# Patient Record
Sex: Female | Born: 1955 | Race: White | Hispanic: No | Marital: Married | Smoking: Never smoker
Health system: Southern US, Community
[De-identification: ages and names within clinical notes are randomized; demographics above are authoritative.]

## PROBLEM LIST (undated history)

## (undated) DIAGNOSIS — F410 Panic disorder [episodic paroxysmal anxiety] without agoraphobia: Secondary | ICD-10-CM

## (undated) DIAGNOSIS — Q8581 Pten hamartoma tumor syndrome: Secondary | ICD-10-CM

## (undated) DIAGNOSIS — L439 Lichen planus, unspecified: Secondary | ICD-10-CM

## (undated) DIAGNOSIS — K589 Irritable bowel syndrome without diarrhea: Secondary | ICD-10-CM

## (undated) DIAGNOSIS — I10 Essential (primary) hypertension: Secondary | ICD-10-CM

## (undated) DIAGNOSIS — K649 Unspecified hemorrhoids: Secondary | ICD-10-CM

## (undated) DIAGNOSIS — E785 Hyperlipidemia, unspecified: Secondary | ICD-10-CM

## (undated) DIAGNOSIS — I2510 Atherosclerotic heart disease of native coronary artery without angina pectoris: Secondary | ICD-10-CM

## (undated) DIAGNOSIS — K219 Gastro-esophageal reflux disease without esophagitis: Secondary | ICD-10-CM

## (undated) DIAGNOSIS — I5189 Other ill-defined heart diseases: Secondary | ICD-10-CM

## (undated) DIAGNOSIS — G4730 Sleep apnea, unspecified: Secondary | ICD-10-CM

## (undated) DIAGNOSIS — M797 Fibromyalgia: Secondary | ICD-10-CM

## (undated) DIAGNOSIS — K2210 Ulcer of esophagus without bleeding: Secondary | ICD-10-CM

## (undated) DIAGNOSIS — D369 Benign neoplasm, unspecified site: Secondary | ICD-10-CM

## (undated) DIAGNOSIS — R7989 Other specified abnormal findings of blood chemistry: Secondary | ICD-10-CM

## (undated) DIAGNOSIS — K2970 Gastritis, unspecified, without bleeding: Secondary | ICD-10-CM

## (undated) DIAGNOSIS — M810 Age-related osteoporosis without current pathological fracture: Secondary | ICD-10-CM

## (undated) DIAGNOSIS — E538 Deficiency of other specified B group vitamins: Secondary | ICD-10-CM

## (undated) DIAGNOSIS — F419 Anxiety disorder, unspecified: Secondary | ICD-10-CM

## (undated) DIAGNOSIS — M1990 Unspecified osteoarthritis, unspecified site: Secondary | ICD-10-CM

## (undated) DIAGNOSIS — Z87442 Personal history of urinary calculi: Secondary | ICD-10-CM

## (undated) DIAGNOSIS — C44611 Basal cell carcinoma of skin of unspecified upper limb, including shoulder: Secondary | ICD-10-CM

## (undated) DIAGNOSIS — G4733 Obstructive sleep apnea (adult) (pediatric): Secondary | ICD-10-CM

## (undated) DIAGNOSIS — Z7902 Long term (current) use of antithrombotics/antiplatelets: Secondary | ICD-10-CM

## (undated) HISTORY — PX: ESOPHAGOGASTRODUODENOSCOPY: SHX1529

## (undated) HISTORY — DX: Other ill-defined heart diseases: I51.89

## (undated) HISTORY — PX: COLONOSCOPY: SHX174

## (undated) HISTORY — PX: CHOLECYSTECTOMY: SHX55

## (undated) HISTORY — PX: SKIN CANCER EXCISION: SHX779

## (undated) HISTORY — DX: Age-related osteoporosis without current pathological fracture: M81.0

## (undated) HISTORY — PX: JOINT REPLACEMENT: SHX530

## (undated) HISTORY — PX: TUBAL LIGATION: SHX77

## (undated) HISTORY — DX: Gastro-esophageal reflux disease without esophagitis: K21.9

## (undated) HISTORY — DX: Unspecified osteoarthritis, unspecified site: M19.90

## (undated) HISTORY — PX: ABDOMINAL HYSTERECTOMY: SHX81

## (undated) HISTORY — PX: DILATION AND CURETTAGE OF UTERUS: SHX78

## (undated) HISTORY — DX: Morbid (severe) obesity due to excess calories: E66.01

## (undated) HISTORY — DX: Essential (primary) hypertension: I10

## (undated) HISTORY — DX: Hyperlipidemia, unspecified: E78.5

---

## 2004-07-15 ENCOUNTER — Ambulatory Visit: Payer: Self-pay | Admitting: Internal Medicine

## 2005-03-30 HISTORY — PX: CORONARY ANGIOPLASTY WITH STENT PLACEMENT: SHX49

## 2005-09-17 ENCOUNTER — Ambulatory Visit: Payer: Self-pay | Admitting: Internal Medicine

## 2005-12-14 ENCOUNTER — Other Ambulatory Visit: Payer: Self-pay

## 2005-12-14 ENCOUNTER — Emergency Department: Payer: Self-pay | Admitting: Emergency Medicine

## 2005-12-15 ENCOUNTER — Ambulatory Visit: Payer: Self-pay | Admitting: Emergency Medicine

## 2006-01-20 ENCOUNTER — Inpatient Hospital Stay: Payer: Self-pay | Admitting: Internal Medicine

## 2006-01-20 ENCOUNTER — Other Ambulatory Visit: Payer: Self-pay

## 2006-01-21 ENCOUNTER — Other Ambulatory Visit: Payer: Self-pay

## 2006-01-21 HISTORY — PX: CORONARY ANGIOPLASTY WITH STENT PLACEMENT: SHX49

## 2006-01-23 ENCOUNTER — Other Ambulatory Visit: Payer: Self-pay

## 2006-01-24 ENCOUNTER — Observation Stay: Payer: Self-pay | Admitting: Internal Medicine

## 2006-06-17 ENCOUNTER — Observation Stay: Payer: Self-pay | Admitting: Internal Medicine

## 2006-06-17 ENCOUNTER — Other Ambulatory Visit: Payer: Self-pay

## 2007-02-15 ENCOUNTER — Ambulatory Visit: Payer: Self-pay | Admitting: Internal Medicine

## 2007-10-08 ENCOUNTER — Other Ambulatory Visit: Payer: Self-pay

## 2007-10-08 ENCOUNTER — Emergency Department: Payer: Self-pay | Admitting: Emergency Medicine

## 2007-10-09 ENCOUNTER — Other Ambulatory Visit: Payer: Self-pay

## 2007-10-09 ENCOUNTER — Emergency Department: Payer: Self-pay | Admitting: Emergency Medicine

## 2008-02-16 ENCOUNTER — Ambulatory Visit: Payer: Self-pay | Admitting: Internal Medicine

## 2009-02-28 ENCOUNTER — Ambulatory Visit: Payer: Self-pay | Admitting: Internal Medicine

## 2009-03-12 ENCOUNTER — Observation Stay: Payer: Self-pay | Admitting: Internal Medicine

## 2010-03-04 ENCOUNTER — Ambulatory Visit: Payer: Self-pay | Admitting: Internal Medicine

## 2011-05-06 ENCOUNTER — Ambulatory Visit: Payer: Self-pay | Admitting: Internal Medicine

## 2012-05-09 ENCOUNTER — Ambulatory Visit: Payer: Self-pay | Admitting: Internal Medicine

## 2013-07-12 ENCOUNTER — Ambulatory Visit: Payer: Self-pay | Admitting: Internal Medicine

## 2013-11-29 DIAGNOSIS — M7540 Impingement syndrome of unspecified shoulder: Secondary | ICD-10-CM | POA: Insufficient documentation

## 2013-12-20 DIAGNOSIS — I2510 Atherosclerotic heart disease of native coronary artery without angina pectoris: Secondary | ICD-10-CM | POA: Insufficient documentation

## 2013-12-20 DIAGNOSIS — M810 Age-related osteoporosis without current pathological fracture: Secondary | ICD-10-CM | POA: Insufficient documentation

## 2013-12-20 DIAGNOSIS — E559 Vitamin D deficiency, unspecified: Secondary | ICD-10-CM | POA: Insufficient documentation

## 2013-12-20 DIAGNOSIS — E785 Hyperlipidemia, unspecified: Secondary | ICD-10-CM | POA: Insufficient documentation

## 2014-07-19 ENCOUNTER — Ambulatory Visit
Admit: 2014-07-19 | Disposition: A | Discharge status: Home / Self Care | Payer: Self-pay | Attending: Internal Medicine | Admitting: Internal Medicine

## 2014-11-14 ENCOUNTER — Ambulatory Visit (INDEPENDENT_AMBULATORY_CARE_PROVIDER_SITE_OTHER): Payer: BLUE CROSS/BLUE SHIELD

## 2014-11-14 ENCOUNTER — Encounter: Payer: Self-pay | Admitting: Podiatry

## 2014-11-14 ENCOUNTER — Ambulatory Visit (INDEPENDENT_AMBULATORY_CARE_PROVIDER_SITE_OTHER): Payer: BLUE CROSS/BLUE SHIELD | Admitting: Podiatry

## 2014-11-14 VITALS — BP 143/84 | HR 65 | Resp 18

## 2014-11-14 DIAGNOSIS — M2010 Hallux valgus (acquired), unspecified foot: Secondary | ICD-10-CM

## 2014-11-14 DIAGNOSIS — Q6650 Congenital pes planus, unspecified foot: Secondary | ICD-10-CM | POA: Diagnosis not present

## 2014-11-14 DIAGNOSIS — M76829 Posterior tibial tendinitis, unspecified leg: Secondary | ICD-10-CM

## 2014-11-14 DIAGNOSIS — M6789 Other specified disorders of synovium and tendon, multiple sites: Secondary | ICD-10-CM

## 2014-11-14 DIAGNOSIS — R52 Pain, unspecified: Secondary | ICD-10-CM

## 2014-11-14 NOTE — Progress Notes (Signed)
   Subjective:    Patient ID: Judy Prince, female    DOB: 12/27/1955, 59 y.o.   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 And states that she walks on the insides of her ankles as well and she states that she does not want to be like this.    Review of Systems  HENT: Positive for sinus pressure.   Respiratory: Positive for cough.   Musculoskeletal:       MUSCLE PAIN  Hematological: Bruises/bleeds easily.  All other systems reviewed and are negative.      Objective:   Physical Exam: 59 year old white female in no apparent distress vital signs are stable alert and oriented 3 presents strong palpable pulses bilateral. Capillary fill time to digits 1 through 5 is immediate. Neurologic since is intact percent lasting monofilament. Deep tendon reflexes are intact bilateral and muscle strength is 5 over 5 dorsiflexion plantar flexors and inverters everters all intrinsic musculature is intact. Orthopedic evaluation does demonstrate severe pes planus bilateral flexible in nature. Mild hallux abductovalgus deformity is also noted. Radiographic evaluation AP medial oblique and lateral views do demonstrate severe osteopenia today also demonstrates severe flatfoot deformity with mild to moderate hallux abductovalgus deformities. No major osseous abnormalities such as fracture or fusions or coalitions. No tumors noted. Cutaneous evaluation demonstrates a well hydrated cutis no erythema edema cellulitis drainage or odor.        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 Hallux abductovalgus deformity bilateral.  Plan: Discussed etiology pathology conservative versus surgical therapies. I did suggest the necessity of  orthotics she understands this and is amenable to it reluctantly. She was scanned today for set of orthotics.  Dr. Roselind Messier

## 2014-12-12 ENCOUNTER — Ambulatory Visit: Payer: BLUE CROSS/BLUE SHIELD | Admitting: Podiatry

## 2014-12-26 ENCOUNTER — Encounter: Payer: Self-pay | Admitting: Podiatry

## 2014-12-26 ENCOUNTER — Ambulatory Visit (INDEPENDENT_AMBULATORY_CARE_PROVIDER_SITE_OTHER): Payer: BLUE CROSS/BLUE SHIELD | Admitting: Podiatry

## 2014-12-26 DIAGNOSIS — Q6650 Congenital pes planus, unspecified foot: Secondary | ICD-10-CM

## 2014-12-26 DIAGNOSIS — M6789 Other specified disorders of synovium and tendon, multiple sites: Secondary | ICD-10-CM

## 2014-12-26 DIAGNOSIS — M76829 Posterior tibial tendinitis, unspecified leg: Secondary | ICD-10-CM

## 2014-12-26 NOTE — Patient Instructions (Signed)

## 2014-12-26 NOTE — Progress Notes (Signed)
She presents today to pick up orthotics both oral and written home-going instructions were provided and I will follow-up with her in 1 month she will call sooner if needed.

## 2015-01-28 ENCOUNTER — Encounter: Payer: Self-pay | Admitting: Podiatry

## 2015-01-28 ENCOUNTER — Ambulatory Visit (INDEPENDENT_AMBULATORY_CARE_PROVIDER_SITE_OTHER): Payer: BLUE CROSS/BLUE SHIELD | Admitting: Podiatry

## 2015-01-28 DIAGNOSIS — G5761 Lesion of plantar nerve, right lower limb: Secondary | ICD-10-CM | POA: Diagnosis not present

## 2015-01-28 DIAGNOSIS — M76829 Posterior tibial tendinitis, unspecified leg: Secondary | ICD-10-CM

## 2015-01-28 DIAGNOSIS — Q6650 Congenital pes planus, unspecified foot: Secondary | ICD-10-CM | POA: Diagnosis not present

## 2015-01-28 DIAGNOSIS — M6789 Other specified disorders of synovium and tendon, multiple sites: Secondary | ICD-10-CM | POA: Diagnosis not present

## 2015-01-28 NOTE — Progress Notes (Signed)
She presents today for follow-up of her plantar fasciitis and the use of her orthotics. She states that she was doing fine until she wore a particular pair shoes with her orthotics. She states that it hurts so bad Saturday she had to call the doctor on call.  Objective: Vital signs are stable she is alert and oriented 3. She has pain on palpation to the third interdigital space of the right foot. This is consistent with a neuroma.  Assessment: Neuroma third interdigital space right foot.  Plan: Injected the area today with Kenalog and local anesthetic. Recommended that she get back into her orthotics and discussed appropriate shoe gear. Follow up with her for her fasciitis and neuroma in 1 month.  Roselind Messier DPM

## 2015-02-12 ENCOUNTER — Telehealth: Payer: Self-pay | Admitting: *Deleted

## 2015-02-12 NOTE — Telephone Encounter (Signed)
Ice and Tylenol per Dr Milinda Pointer and keep follow up appt.

## 2015-02-12 NOTE — Telephone Encounter (Addendum)
Pt states the doctor gave her an injection last visit, now feels like it has worsened, she can't take ANSAIDS, because she's on Plavix and they also upset her stomach. Pt wanted an appt and was transferred to schedulers.  Informed pt of Dr. Stephenie Acres orders.

## 2015-02-27 ENCOUNTER — Ambulatory Visit (INDEPENDENT_AMBULATORY_CARE_PROVIDER_SITE_OTHER): Payer: BLUE CROSS/BLUE SHIELD | Admitting: Podiatry

## 2015-02-27 ENCOUNTER — Encounter: Payer: Self-pay | Admitting: Podiatry

## 2015-02-27 DIAGNOSIS — G5791 Unspecified mononeuropathy of right lower limb: Secondary | ICD-10-CM | POA: Diagnosis not present

## 2015-02-27 DIAGNOSIS — G5761 Lesion of plantar nerve, right lower limb: Secondary | ICD-10-CM

## 2015-02-27 NOTE — Progress Notes (Signed)
She presents today for follow-up of a neuroma third interdigital space of the right foot and now has pain to the dorsal medial aspect of the right foot that radiates of the ankle. She states that she gets numbness and tingling along the medial cutaneous nerve as she points to the dorsal aspect of the right foot.  Objective: Vital signs are stable she is alert and oriented 3. She no longer has pain on palpation to the third interdigital space of the right foot pulses remain palpable. She has tenderness on palpation along the length of the medial cutaneous nerve right foot.  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 Resolution of neuroma with use of orthotics and injection.  Plan: I I injected dexamethasone to the point of maximal tenderness today to be alleviated her symptoms. Upon leaving the office she was pain-free. Follow up with her in a few months.

## 2015-04-03 ENCOUNTER — Ambulatory Visit: Payer: BLUE CROSS/BLUE SHIELD | Admitting: Podiatry

## 2015-04-05 ENCOUNTER — Ambulatory Visit (INDEPENDENT_AMBULATORY_CARE_PROVIDER_SITE_OTHER): Payer: BLUE CROSS/BLUE SHIELD | Admitting: Sports Medicine

## 2015-04-05 ENCOUNTER — Encounter: Payer: Self-pay | Admitting: Sports Medicine

## 2015-04-05 ENCOUNTER — Ambulatory Visit: Payer: Self-pay

## 2015-04-05 DIAGNOSIS — S92301A Fracture of unspecified metatarsal bone(s), right foot, initial encounter for closed fracture: Secondary | ICD-10-CM | POA: Diagnosis not present

## 2015-04-05 DIAGNOSIS — S82891A Other fracture of right lower leg, initial encounter for closed fracture: Secondary | ICD-10-CM

## 2015-04-05 DIAGNOSIS — M79671 Pain in right foot: Secondary | ICD-10-CM

## 2015-04-05 NOTE — Progress Notes (Signed)
Patient ID: [name], female   DOB: Apr 05, 1955, 60 y.o.   MRN: [id number] Subjective: Judy Prince is a 61 y.o. female patient who presents to office for evaluation of Right ankle and foot pain. Patient complains of progressive pain since yesterday when she attempts to move; states she went to urgent care after she tripped and fell over a flagstone. She states that she was put in a boot, told not to put any weight on it, and to take her pain med. Patient denies any other pedal complaints.   Osteoporosis on Fosamax  There are no active problems to display for this patient.  Current Outpatient Prescriptions on File Prior to Visit  Medication Sig Dispense Refill  . alendronate (FOSAMAX) 70 MG tablet Take by mouth.    Marland Kitchen aspirin EC 81 MG tablet Take by mouth.    . calcium acetate (PHOSLO) 667 MG tablet Take by mouth.    . cetirizine (ZYRTEC) 10 MG tablet Take by mouth.    . clopidogrel (PLAVIX) 75 MG tablet Take by mouth.    . fluticasone (FLONASE) 50 MCG/ACT nasal spray USE 2 SPRAYS IN EACH NOSTRIL EVERY DAY    . hydrochlorothiazide (HYDRODIURIL) 25 MG tablet Take by mouth.    Marland Kitchen HYDROcodone-acetaminophen (NORCO/VICODIN) 5-325 MG per tablet Take by mouth.    . metoprolol (LOPRESSOR) 50 MG tablet Take by mouth.    . pantoprazole (PROTONIX) 40 MG tablet TAKE 1 TABLET BY MOUTH ONCE A DAY    . rosuvastatin (CRESTOR) 20 MG tablet Take by mouth.    . temazepam (RESTORIL) 15 MG capsule Take by mouth.     No current facility-administered medications on file prior to visit.   No Known Allergies   Objective:  General: Alert and oriented x3 in no acute distress  Dermatology: No open lesions bilateral lower extremities, no webspace macerations, mild ecchymosis right lateral ankle and foot, no fracture blisters, all nails x 10 are well manicured.  Vascular: Focal edema noted to right foot and ankle. Dorsalis Pedis and Posterior Tibial pedal pulses 2/4, Capillary Fill Time 3 seconds,(+) pedal hair  growth bilateral, Temperature gradient within normal limits.  Neurology: Johney Maine sensation intact via light touch bilateral. .   Musculoskeletal: There is tenderness with palpation at lateral ankle, fibular and 5th met on Right foot, No pain iwht tib-fib stress. There is pain present with tuning fork to Right ankle/ foot,No pain with calf compression bilateral. All joint range of motion is within normal limits except on right where there is guarding and pain, Strength within acceptable for patient status.    Xrays 04-04-15 From Urgent Care  Plain film right ankle / foot:  Fracture at the base of the fifth metatarsal minimally displaced. Fracture at the base of the lateral malleolus minimally displaced.    Assessment and Plan: Problem List Items Addressed This Visit    None    Visit Diagnoses    Right foot pain    -  Primary    Closed right ankle fracture, initial encounter        lateral malleous    Closed fracture of metatarsal bone, right, initial encounter        5th met      -Complete examination performed -Xrays report reviewed and discussed with patient -Discussed treatement options for fracture; risks, alternatives, and benefits explained. -Applied surgitube stockinette to assist with edema control -Cont with CAM walker to patient to wear at all times and instructed on use; NWB on  right with use of crutches or wheelchair. -Recommend protection, rest, ice, elevation daily until symptoms improve -Cont with Norco as needed for pain  -Patient to return to office in 2 weeks for serial x-rays to assess healing  or sooner if condition worsens.  -Patient also left a copy of paper from [facility] for Dr. Milinda Pointer to review in re: Injection for Neuritis; Informed patient that we will give info to Dr. Milinda Pointer for review and have our billing department handle the issue.   Landis Martins, DPM

## 2015-04-05 NOTE — Patient Instructions (Signed)
Ankle Fracture A fracture is a break in a bone. The ankle joint is made up of three bones. These include the lower (distal)sections of your lower leg bones, called the tibia and fibula, along with a bone in your foot, called the talus. Depending on how bad the break is and if more than one ankle joint bone is broken, a cast or splint is used to protect and keep your injured bone from moving while it heals. Sometimes, surgery is required to help the fracture heal properly.  There are two general types of fractures:  Stable fracture. This includes a single fracture line through one bone, with no injury to ankle ligaments. A fracture of the talus that does not have any displacement (movement of the bone on either side of the fracture line) is also stable.  Unstable fracture. This includes more than one fracture line through one or more bones in the ankle joint. It also includes fractures that have displacement of the bone on either side of the fracture line. CAUSES  A direct blow to the ankle.   Quickly and severely twisting your ankle.  Trauma, such as a car accident or falling from a significant height. RISK FACTORS You may be at a higher risk of ankle fracture if:  You have certain medical conditions.  You are involved in high-impact sports.  You are involved in a high-impact car accident. SIGNS AND SYMPTOMS   Tender and swollen ankle.  Bruising around the injured ankle.  Pain on movement of the ankle.  Difficulty walking or putting weight on the ankle.  A cold foot below the site of the ankle injury. This can occur if the blood vessels passing through your injured ankle were also damaged.  Numbness in the foot below the site of the ankle injury. DIAGNOSIS  An ankle fracture is usually diagnosed with a physical exam and X-rays. A CT scan may also be required for complex fractures. TREATMENT  Stable fractures are treated with a cast or splint and using crutches to avoid putting  weight on your injured ankle. This is followed by an ankle strengthening program. Some patients require a special type of cast, depending on other medical problems they may have. Unstable fractures require surgery to ensure the bones heal properly. Your health care provider will tell you what type of fracture you have and the best treatment for your condition. HOME CARE INSTRUCTIONS   Review correct crutch use with your health care provider and use your crutches as directed. Safe use of crutches is extremely important. Misuse of crutches can cause you to fall or cause injury to nerves in your hands or armpits.  Do not put weight or pressure on the injured ankle until directed by your health care provider.  To lessen the swelling, keep the injured leg elevated while sitting or lying down.  Apply ice to the injured area:  Put ice in a plastic bag.  Place a towel between your cast and the bag.  Leave the ice on for 20 minutes, 2-3 times a day.  If you have a plaster or fiberglass cast:  Do not try to scratch the skin under the cast with any objects. This can increase your risk of skin infection.  Check the skin around the cast every day. You may put lotion on any red or sore areas.  Keep your cast dry and clean.  If you have a plaster splint:  Wear the splint as directed.  You may loosen the elastic   around the splint if your toes become numb, tingle, or turn cold or blue.  Do not put pressure on any part of your cast or splint; it may break. Rest your cast only on a pillow the first 24 hours until it is fully hardened.  Your cast or splint can be protected during bathing with a plastic bag sealed to your skin with medical tape. Do not lower the cast or splint into water.  Take medicines as directed by your health care provider. Only take over-the-counter or prescription medicines for pain, discomfort, or fever as directed by your health care provider.  Do not drive a vehicle until  your health care provider specifically tells you it is safe to do so.  If your health care provider has given you a follow-up appointment, it is very important to keep that appointment. Not keeping the appointment could result in a chronic or permanent injury, pain, and disability. If you have any problem keeping the appointment, call the facility for assistance. SEEK MEDICAL CARE IF: You develop increased swelling or discomfort. SEEK IMMEDIATE MEDICAL CARE IF:   Your cast gets damaged or breaks.  You have continued severe pain.  You develop new pain or swelling after the cast was put on.  Your skin or toenails below the injury turn blue or gray.  Your skin or toenails below the injury feel cold, numb, or have loss of sensitivity to touch.  There is a bad smell or pus draining from under the cast. MAKE SURE YOU:   Understand these instructions.  Will watch your condition.  Will get help right away if you are not doing well or get worse.   This information is not intended to replace advice given to you by your health care provider. Make sure you discuss any questions you have with your health care provider.  Metatarsal Fracture A metatarsal fracture is a break in a metatarsal bone. Metatarsal bones connect your toe bones to your ankle bones. CAUSES This type of fracture may be caused by:  A sudden twisting of your foot.  A fall onto your foot.  Overuse or repetitive exercise. RISK FACTORS This condition is more likely to develop in people who:  Play contact sports.  Have a bone disease.  Have a low calcium level. SYMPTOMS Symptoms of this condition include:  Pain that is worse when walking or standing.  Pain when pressing on the foot or moving the toes.  Swelling.  Bruising on the top or bottom of the foot.  A foot that appears shorter than the other one. DIAGNOSIS This condition is diagnosed with a physical exam. You may also have imaging tests, such  as:  X-rays.  A CT scan.  MRI. TREATMENT Treatment for this condition depends on its severity and whether a bone has moved out of place. Treatment may involve:  Rest.  Wearing foot support such as a cast, splint, or boot for several weeks.  Using crutches.  Surgery to move bones back into the right position. Surgery is usually needed if there are many pieces of broken bone or bones that are very out of place (displaced fracture).  Physical therapy. This may be needed to help you regain full movement and strength in your foot. You will need to return to your health care provider to have X-rays taken until your bones heal. Your health care provider will look at the X-rays to make sure that your foot is healing well. HOME CARE INSTRUCTIONS  If You  Have a Cast:  Do not stick anything inside the cast to scratch your skin. Doing that increases your risk of infection.  Check the skin around the cast every day. Report any concerns to your health care provider. You may put lotion on dry skin around the edges of the cast. Do not apply lotion to the skin underneath the cast.  Keep the cast clean and dry. If You Have a Splint or a Supportive Boot:  Wear it as directed by your health care provider. Remove it only as directed by your health care provider.  Loosen it if your toes become numb and tingle, or if they turn cold and blue.  Keep it clean and dry. Bathing  Do not take baths, swim, or use a hot tub until your health care provider approves. Ask your health care provider if you can take showers. You may only be allowed to take sponge baths for bathing.  If your health care provider approves bathing and showering, cover the cast or splint with a watertight plastic bag to protect it from water. Do not let the cast or splint get wet. Managing Pain, Stiffness, and Swelling  If directed, apply ice to the injured area (if you have a splint, not a cast).  Put ice in a plastic  bag.  Place a towel between your skin and the bag.  Leave the ice on for 20 minutes, 2-3 times per day.  Move your toes often to avoid stiffness and to lessen swelling.  Raise (elevate) the injured area above the level of your heart while you are sitting or lying down. Driving  Do not drive or operate heavy machinery while taking pain medicine.  Do not drive while wearing foot support on a foot that you use for driving. Activity  Return to your normal activities as directed by your health care provider. Ask your health care provider what activities are safe for you.  Perform exercises as directed by your health care provider or physical therapist. Safety  Do not use the injured foot to support your body weight until your health care provider says that you can. Use crutches as directed by your health care provider. General Instructions  Do not put pressure on any part of the cast or splint until it is fully hardened. This may take several hours.  Do not use any tobacco products, including cigarettes, chewing tobacco, or e-cigarettes. Tobacco can delay bone healing. If you need help quitting, ask your health care provider.  Take medicines only as directed by your health care provider.  Keep all follow-up visits as directed by your health care provider. This is important. SEEK MEDICAL CARE IF:  You have a fever.  Your cast, splint, or boot is too loose or too tight.  Your cast, splint, or boot is damaged.  Your pain medicine is not helping.  You have pain, tingling, or numbness in your foot that is not going away. SEEK IMMEDIATE MEDICAL CARE IF:  You have severe pain.  You have tingling or numbness in your foot that is getting worse.  Your foot feels cold or becomes numb.  Your foot changes color.   This information is not intended to replace advice given to you by your health care provider. Make sure you discuss any questions you have with your health care  provider.   Document Released: 12/06/2001 Document Revised: 07/31/2014 Document Reviewed: 01/10/2014 Elsevier Interactive Patient Education 2016 [place] Released: 03/13/2000 Document Revised: 03/21/2013  Document Reviewed: 10/13/2012 Elsevier Interactive Patient Education [employer].

## 2015-04-08 ENCOUNTER — Telehealth: Payer: Self-pay | Admitting: *Deleted

## 2015-04-08 NOTE — Telephone Encounter (Addendum)
Pt states she needs a note for FMLA, stating she will be out of work with a fractured ankle.  04/10/2015 - pt states she has a itch red rash up her ankle from the elastic compression ankle given to her on Friday for the fractured ankle.  I asked the pt if she felt the elastic anklet is the possible cause of the rash and pt states she hasn't had it off since it was put on 04/05/2015.  I told pt to take the elastic anklet off and cleanse the skin gently with cool water and wash the anklet and put it back on in the morning and only wear it during the day not at night and to wash it every night.  Pt states she is sleeping in the boot, because she is afraid if she turns she may injure the ankle.  I told pt that was okay, but not the anklet.

## 2015-04-09 ENCOUNTER — Encounter: Payer: Self-pay | Admitting: Sports Medicine

## 2015-04-19 ENCOUNTER — Ambulatory Visit (INDEPENDENT_AMBULATORY_CARE_PROVIDER_SITE_OTHER): Payer: BLUE CROSS/BLUE SHIELD | Admitting: Sports Medicine

## 2015-04-19 ENCOUNTER — Ambulatory Visit: Payer: Self-pay

## 2015-04-19 ENCOUNTER — Encounter: Payer: Self-pay | Admitting: Sports Medicine

## 2015-04-19 DIAGNOSIS — S92301D Fracture of unspecified metatarsal bone(s), right foot, subsequent encounter for fracture with routine healing: Secondary | ICD-10-CM | POA: Diagnosis not present

## 2015-04-19 DIAGNOSIS — M6281 Muscle weakness (generalized): Secondary | ICD-10-CM | POA: Diagnosis not present

## 2015-04-19 DIAGNOSIS — M79671 Pain in right foot: Secondary | ICD-10-CM

## 2015-04-19 DIAGNOSIS — S82891D Other fracture of right lower leg, subsequent encounter for closed fracture with routine healing: Secondary | ICD-10-CM

## 2015-04-19 NOTE — Progress Notes (Signed)
Patient ID: DAMILOLA FLAMM, female   DOB: [birth year], 60 y.o.   MRN: [id number]  Subjective: Judy Prince is a 60 y.o. female patient returns to office for follow up evaluation of Right ankle and foot pain, fracture sites; Injury of trip and fall occurred 15 days ago. Patient states that pain is present with moving the right foot and ankle however the pain is not bad when her leg is propped up and she doesn't move it; Patient states that she is only taking Tylenol for pain which seems to help; Patient states that she had a rash from the stocking because she never took the stockinet or boot off initially; States that she treated the area with cortisone cream with resolution. Patient denies any other pedal complaints.   Hx of Osteoporosis on Fosamax  There are no active problems to display for this patient.  Current Outpatient Prescriptions on File Prior to Visit  Medication Sig Dispense Refill  . alendronate (FOSAMAX) 70 MG tablet Take by mouth.    Marland Kitchen aspirin EC 81 MG tablet Take by mouth.    . calcium acetate (PHOSLO) 667 MG tablet Take by mouth.    . cetirizine (ZYRTEC) 10 MG tablet Take by mouth.    . clopidogrel (PLAVIX) 75 MG tablet Take by mouth.    . fluticasone (FLONASE) 50 MCG/ACT nasal spray USE 2 SPRAYS IN EACH NOSTRIL EVERY DAY    . hydrochlorothiazide (HYDRODIURIL) 25 MG tablet Take by mouth.    Marland Kitchen HYDROcodone-acetaminophen (NORCO/VICODIN) 5-325 MG per tablet Take by mouth.    . metoprolol (LOPRESSOR) 50 MG tablet Take by mouth.    . pantoprazole (PROTONIX) 40 MG tablet TAKE 1 TABLET BY MOUTH ONCE A DAY    . rosuvastatin (CRESTOR) 20 MG tablet Take by mouth.    . temazepam (RESTORIL) 15 MG capsule Take by mouth.     No current facility-administered medications on file prior to visit.   No Known Allergies   Objective:  General: Alert and oriented x3 in no acute distress  Dermatology: No open lesions bilateral lower extremities, no webspace macerations, decreased ecchymosis  right lateral ankle and foot, no rash to right leg, no fracture blisters, all nails x 10 are well manicured.  Vascular: Mild focal edema noted to right foot and ankle. Dorsalis Pedis and Posterior Tibial pedal pulses 2/4, Capillary Fill Time 3 seconds,(+) pedal hair growth bilateral, Temperature gradient within normal limits.  Neurology: Johney Maine sensation intact via light touch bilateral. .   Musculoskeletal: There is mild  tenderness with palpation at lateral ankle, fibula and 5th met on Right foot, No pain wiht tib-fib stress. There is pain present with tuning fork to Right ankle/ foot, No pain with calf compression bilateral. All joint range of motion is within normal limits except on right where there is guarding and pain at fracture sites, Strength within acceptable for patient status however patient states that she feels like she is very weak.    X-rays Right foot and ankle,  Compared to x-rays from 04-04-15 From Urgent Care Impression: Fracture at the base of the fifth metatarsal minimally displaced. Fracture at the distal tip of the lateral malleolus minimally displaced. No other acute findings.     Assessment and Plan: Problem List Items Addressed This Visit    None    Visit Diagnoses    Right foot pain    -  Primary    Relevant Orders    DG Foot Complete Right    DG  Ankle 2 Views Right    Closed right ankle fracture, with routine healing, subsequent encounter        distal lateral malleolus    Closed fracture of metatarsal bone, right, with routine healing, subsequent encounter        5th met base    Muscle weakness of lower extremity          -Complete examination performed -Xrays reviewed  -Discussed treatement and plan of care for fracture; risks, alternatives, and benefits explained. -Cont with surgitube stockinette to assist with edema control -Patient may shower using shower chair with assistance and NWB on right -Cont with CAM walker to patient to wear at all times and  instructed on use; NWB on right with use of crutches or wheelchair. -Recommend protection, rest, ice, elevation daily until symptoms improve -Cont with Tylenol as needed for pain  -Patient to return to office in 4 weeks for serial x-rays to assess healing  or sooner if condition worsens  Landis Martins, DPM

## 2015-05-15 ENCOUNTER — Encounter: Payer: Self-pay | Admitting: Emergency Medicine

## 2015-05-15 DIAGNOSIS — I10 Essential (primary) hypertension: Secondary | ICD-10-CM | POA: Insufficient documentation

## 2015-05-15 NOTE — ED Notes (Signed)
Pt presents to ED with c/o sudden spike in her BP-197/90, associated with slight headache-no headache now. Pt states she had cortisone shot and it usually raise her BP. Pt taken metoprolol 50mg  at 2030. BP-163/78 at triage. Pt wearing ortho boot on right leg for ankle fracture.

## 2015-05-16 ENCOUNTER — Emergency Department
Admission: EM | Admit: 2015-05-16 | Discharge: 2015-05-16 | Disposition: A | Discharge status: Home / Self Care | Payer: Self-pay | Attending: Emergency Medicine | Admitting: Emergency Medicine

## 2015-05-16 NOTE — ED Notes (Signed)
Patient presents to the desk reporting that she wants to leave because of transportation issues. Patient reports that her husband is "too drunk" to come and get her and that her sister had to get home because she had to take someone else to work and she had to work in the morning. Patient also states, "If I stay I am going to have to pay $300 and then I wont have no way home. If my BP is ok I am going to go ahead and go home." VS assessed by this RN. P62, R18, BP 166/62, SPO2 96%; recorded in VSFS as well. Patient talked to this RN about concerns, PMH, and what she should do if BP goes back up. Patient cites that taking HYDROcodone for her foot is what "ran it up". Patient encouraged to return to the ED for any further concerns or new symptoms; verbalized understanding and consent. Patient consulted with this RN from [phone] and decided to leave as previously mentioned.

## 2015-05-17 ENCOUNTER — Other Ambulatory Visit: Payer: Self-pay | Admitting: Sports Medicine

## 2015-05-17 ENCOUNTER — Ambulatory Visit (INDEPENDENT_AMBULATORY_CARE_PROVIDER_SITE_OTHER): Payer: BLUE CROSS/BLUE SHIELD

## 2015-05-17 ENCOUNTER — Ambulatory Visit (INDEPENDENT_AMBULATORY_CARE_PROVIDER_SITE_OTHER): Payer: BLUE CROSS/BLUE SHIELD | Admitting: Sports Medicine

## 2015-05-17 ENCOUNTER — Encounter: Payer: Self-pay | Admitting: Sports Medicine

## 2015-05-17 DIAGNOSIS — M79671 Pain in right foot: Secondary | ICD-10-CM | POA: Diagnosis not present

## 2015-05-17 DIAGNOSIS — S82891D Other fracture of right lower leg, subsequent encounter for closed fracture with routine healing: Secondary | ICD-10-CM | POA: Diagnosis not present

## 2015-05-17 DIAGNOSIS — S92301D Fracture of unspecified metatarsal bone(s), right foot, subsequent encounter for fracture with routine healing: Secondary | ICD-10-CM

## 2015-05-17 DIAGNOSIS — M6281 Muscle weakness (generalized): Secondary | ICD-10-CM

## 2015-05-17 NOTE — Progress Notes (Signed)
Patient ID: Judy Prince, female   DOB: 1955/12/19, 60 y.o.   MRN: [id number]  Subjective: Judy Prince is a 60 y.o. female patient returns to office for follow up evaluation of Right ankle and foot pain at fracture sites; Injury of trip and fall occurred on 04-04-15. Patient states that pain is present with direct pressure to sites; Patient states that she is only taking Tylenol for pain which seems to help; but hasn't been sleeping well because she hasn't been taking her Temazepam because of concern for drug interaction with the 1 dose of Norco that she had taken. Patient denies any other pedal complaints.   Hx of Osteoporosis on Fosamax  There are no active problems to display for this patient.  Current Outpatient Prescriptions on File Prior to Visit  Medication Sig Dispense Refill  . alendronate (FOSAMAX) 70 MG tablet Take by mouth.    Marland Kitchen aspirin EC 81 MG tablet Take by mouth.    . calcium acetate (PHOSLO) 667 MG tablet Take by mouth.    . cetirizine (ZYRTEC) 10 MG tablet Take by mouth.    . clopidogrel (PLAVIX) 75 MG tablet Take by mouth.    . fluticasone (FLONASE) 50 MCG/ACT nasal spray USE 2 SPRAYS IN EACH NOSTRIL EVERY DAY    . hydrochlorothiazide (HYDRODIURIL) 25 MG tablet Take by mouth.    Marland Kitchen HYDROcodone-acetaminophen (NORCO/VICODIN) 5-325 MG per tablet Take by mouth.    . metoprolol (LOPRESSOR) 50 MG tablet Take by mouth.    . pantoprazole (PROTONIX) 40 MG tablet TAKE 1 TABLET BY MOUTH ONCE A DAY    . rosuvastatin (CRESTOR) 20 MG tablet Take by mouth.    . temazepam (RESTORIL) 15 MG capsule Take by mouth.     No current facility-administered medications on file prior to visit.   No Known Allergies   Objective:  General: Alert and oriented x3 in no acute distress  Dermatology: No open lesions bilateral lower extremities, no webspace macerations, no ecchymosis right lateral ankle and foot, no rash to right leg, no fracture blisters, all nails x 10 are well  manicured.  Vascular: Mild focal edema noted to right foot and ankle. Dorsalis Pedis and Posterior Tibial pedal pulses 2/4, Capillary Fill Time 3 seconds,(+) pedal hair growth bilateral, Temperature gradient within normal limits.  Neurology: Johney Maine sensation intact via light touch bilateral. .   Musculoskeletal: There is mild tenderness with palpation at lateral ankle, fibula and 5th met on Right foot, No pain with tib-fib stress. There is pain present with tuning fork to Right ankle/ foot, No pain with calf compression bilateral. All joint range of motion is within normal limits except on right where there is guarding and pain at fracture sites, Strength within acceptable for patient status however patient states that she feels like she is very weak.    X-rays Right foot and ankle,  Compared to x-rays from 04-19-15 Impression: Fracture at the base of the fifth metatarsal minimally displaced healing well; 60% improved. Fracture at the distal tip of the lateral malleolus minimally displaced healing well; 90% improved. No other acute findings.     Assessment and Plan: Problem List Items Addressed This Visit    None    Visit Diagnoses    Closed right ankle fracture, with routine healing, subsequent encounter    -  Primary    Closed fracture of metatarsal bone, right, with routine healing, subsequent encounter        Right foot pain  Muscle weakness of lower extremity          -Complete examination performed -Xrays reviewed  -Discussed treatement and plan of care for fracture; risks, alternatives, and benefits explained. -Cont with surgitube stockinette to assist with edema control -Patient may shower using shower chair with assistance and NWB on right -Cont with CAM walker to patient to wear at all times and instructed on use; NWB on right with use of crutches or wheelchair. -Recommend protection, rest, ice, elevation daily until symptoms improve -Cont with Tylenol as needed for  pain; offered Tramadol to patient of which Rx was never picked up in office -Work note: No work. Will determine return to work status at next appointment  -Patient to return to office in 4 weeks for serial x-rays to assess healing  or sooner if condition worsens  Landis Martins, DPM

## 2015-05-29 DIAGNOSIS — M79673 Pain in unspecified foot: Secondary | ICD-10-CM

## 2015-06-14 ENCOUNTER — Ambulatory Visit (INDEPENDENT_AMBULATORY_CARE_PROVIDER_SITE_OTHER): Payer: BLUE CROSS/BLUE SHIELD | Admitting: Sports Medicine

## 2015-06-14 ENCOUNTER — Ambulatory Visit (INDEPENDENT_AMBULATORY_CARE_PROVIDER_SITE_OTHER): Payer: BLUE CROSS/BLUE SHIELD

## 2015-06-14 ENCOUNTER — Encounter: Payer: Self-pay | Admitting: Sports Medicine

## 2015-06-14 DIAGNOSIS — S92301D Fracture of unspecified metatarsal bone(s), right foot, subsequent encounter for fracture with routine healing: Secondary | ICD-10-CM | POA: Diagnosis not present

## 2015-06-14 DIAGNOSIS — M79671 Pain in right foot: Secondary | ICD-10-CM

## 2015-06-14 DIAGNOSIS — S82891D Other fracture of right lower leg, subsequent encounter for closed fracture with routine healing: Secondary | ICD-10-CM

## 2015-06-14 DIAGNOSIS — G5791 Unspecified mononeuropathy of right lower limb: Secondary | ICD-10-CM | POA: Diagnosis not present

## 2015-06-14 DIAGNOSIS — M6281 Muscle weakness (generalized): Secondary | ICD-10-CM | POA: Diagnosis not present

## 2015-06-14 DIAGNOSIS — M79673 Pain in unspecified foot: Secondary | ICD-10-CM

## 2015-06-14 NOTE — Progress Notes (Signed)
Patient ID: Judy Prince, female   DOB: 03-Nov-1955, 60 y.o.   MRN: [id number] Subjective: Judy Prince is a 60 y.o. female patient returns to office for follow up evaluation of Right ankle and foot pain at fracture sites; Injury of trip and fall occurred on 04-04-15. Patient states that pain is getting better; Patient states that she is well rested. Patient denies any other pedal complaints.   Hx of Osteoporosis on Fosamax  Patient Active Problem List   Diagnosis Date Noted  . Arteriosclerosis of coronary artery 12/20/2013  . HLD (hyperlipidemia) 12/20/2013  . OP (osteoporosis) 12/20/2013  . Avitaminosis D 12/20/2013  . Impingement syndrome of shoulder 11/29/2013   Current Outpatient Prescriptions on File Prior to Visit  Medication Sig Dispense Refill  . alendronate (FOSAMAX) 70 MG tablet Take by mouth.    Marland Kitchen aspirin EC 81 MG tablet Take by mouth.    . calcium acetate (PHOSLO) 667 MG tablet Take by mouth.    . cetirizine (ZYRTEC) 10 MG tablet Take by mouth.    . clopidogrel (PLAVIX) 75 MG tablet Take by mouth.    . fluticasone (FLONASE) 50 MCG/ACT nasal spray USE 2 SPRAYS IN EACH NOSTRIL EVERY DAY    . hydrochlorothiazide (HYDRODIURIL) 25 MG tablet Take by mouth.    Marland Kitchen HYDROcodone-acetaminophen (NORCO/VICODIN) 5-325 MG per tablet Take by mouth.    . metoprolol (LOPRESSOR) 50 MG tablet Take by mouth.    . pantoprazole (PROTONIX) 40 MG tablet TAKE 1 TABLET BY MOUTH ONCE A DAY    . rosuvastatin (CRESTOR) 20 MG tablet Take by mouth.    . temazepam (RESTORIL) 15 MG capsule Take by mouth.     No current facility-administered medications on file prior to visit.   No Known Allergies   Objective:  General: Alert and oriented x3 in no acute distress  Dermatology: No open lesions bilateral lower extremities, no webspace macerations, no ecchymosis right lateral ankle and foot, no rash to right leg, no fracture blisters, all nails x 10 are well manicured.  Vascular: Mild focal edema noted to  right foot> ankle. Dorsalis Pedis and Posterior Tibial pedal pulses 2/4, Capillary Fill Time 3 seconds,(+) pedal hair growth bilateral, Temperature gradient within normal limits.  Neurology: Johney Maine sensation intact via light touch bilateral. .   Musculoskeletal: There is decreased tenderness with palpation at lateral ankle, fibula and 5th met on Right foot, No pain with tib-fib stress. There is no pain present with tuning fork to Right ankle/ foot, No pain with calf compression. All joint range of motion is within normal limits for patient status. Strength acceptable for patient status however patient states that she feels like she is very weak.    X-rays Right foot and ankle,  Compared to x-rays from 05-17-15 Impression: Fracture at the base of the fifth metatarsal is healing well; 99% improved/ prematurely healed. Fracture at the distal tip of the lateral malleolus minimally is healed; 100% improved/prematurely healed. No other acute findings.     Assessment and Plan: Problem List Items Addressed This Visit    None    Visit Diagnoses    Right foot pain    -  Primary    Relevant Orders    DG Foot Complete Right    DG Ankle 2 Views Right    Closed right ankle fracture, with routine healing, subsequent encounter        Prematurely healed    Closed fracture of metatarsal bone, right, with routine healing, subsequent encounter  Prematurely healed    Neuritis of foot, right        Muscle weakness of lower extremity          -Complete examination performed -Xrays reviewed  -Discussed treatement and plan of care for fracture; risks, alternatives, and benefits explained. -Dispensed Compression Anklet assist with edema control -Patient may shower using shower chair with assistance  -Cont with CAM walker to patient to wear at all times and instructed on use until next visit for extra protection in the setting of osteoporosis;replacement boot dispensed. May discontinue use of wheelchair  and weightbear with CAM boot -Recommend protection, rest, ice, elevation daily until symptoms improve -Cont with Tylenol as needed for pain as needed -Work note: Patient may return to work on 06-24-15 with use of CAM boot and Cane for assistance in gait. No prolonged walking or standing. No heavy lifting >10 lbs.  -Patient to return to office in 4 weeks for serial x-rays to assess final healing and for PT prescription or sooner if condition worsens  Landis Martins, DPM

## 2015-06-14 NOTE — Patient Instructions (Signed)
Return to work on 06-24-15

## 2015-06-17 ENCOUNTER — Telehealth: Payer: Self-pay | Admitting: *Deleted

## 2015-06-17 ENCOUNTER — Telehealth: Payer: Self-pay | Admitting: Sports Medicine

## 2015-06-17 NOTE — Telephone Encounter (Signed)
Pt states she would like a note stating she can return to work 06/24/2015 using a walker, she is unable to use a cane.  Pt states she will pick up the note in [place] on 06/18/2015.

## 2015-06-17 NOTE — Telephone Encounter (Signed)
Pt called and is using a walker and walked at the store and foot is swollen now. How long should she walk on it daily? Also she got a return to work notice that states she is to use a cane but pt states she is not able to use a cane and needs the note to state she can return to work on 3.27 but has to use a walker. Pt would like to pick up in [place] office tomorrow if possible.

## 2015-06-17 NOTE — Telephone Encounter (Signed)
I will write a note and leave a PT Rx for the patient at the front dest on tomorrow while in [facility]

## 2015-06-17 NOTE — Telephone Encounter (Signed)
Please try this number first when calling pt back.[phone]

## 2015-06-18 ENCOUNTER — Encounter: Payer: Self-pay | Admitting: Sports Medicine

## 2015-06-19 ENCOUNTER — Telehealth: Payer: Self-pay | Admitting: Sports Medicine

## 2015-06-19 NOTE — Telephone Encounter (Signed)
Patient is probably referring to the dexamethasone topical used in at PT. On the Rx for [facility] it states that a Rx for the medication will be required as well. Please provide. Thanks Dr. Cannon Kettle

## 2015-06-19 NOTE — Telephone Encounter (Signed)
Pt called in stating she received a RX from Dr [employer], but she put the RX on the bottom of her Pt RX.. Wants the Rx to be called in she said that the Pt needs the RX so she can have it [id number] for her appt. Wants a call back before its done

## 2015-06-19 NOTE — Telephone Encounter (Signed)
Pt is requesting a prescription to be called into a pharmacy, but I do not see rx in the medication orders.

## 2015-06-20 NOTE — Telephone Encounter (Signed)
J. [place] states pt needs the rx before 200pm today.  I reviewed response from Dr. Cannon Kettle and the rx is for [facility] to use for pt.  Otho Darner states she will contact pt.

## 2015-06-25 ENCOUNTER — Telehealth: Payer: Self-pay | Admitting: Sports Medicine

## 2015-06-25 NOTE — Telephone Encounter (Signed)
Pt called and needs a note stating she can return to work on 4.3.17 with the use of a walker or cane. There is a  Letter stating she can return on the 4.27.17 but pt states she spoke to you and you told her since she has started physical therapy she should be able to return to work. Please fax to nurse Hassan Rowan Brawn @ [employer] and the fax # is [phone].)Please let pt when done.

## 2015-06-26 NOTE — Telephone Encounter (Signed)
Thanks I received the fax of the letter history. Dr. Cannon Kettle

## 2015-06-27 ENCOUNTER — Encounter: Payer: Self-pay | Admitting: Sports Medicine

## 2015-06-28 ENCOUNTER — Encounter: Payer: Self-pay | Admitting: Sports Medicine

## 2015-07-02 DIAGNOSIS — E782 Mixed hyperlipidemia: Secondary | ICD-10-CM | POA: Insufficient documentation

## 2015-07-16 ENCOUNTER — Encounter: Payer: Self-pay | Admitting: Sports Medicine

## 2015-07-16 ENCOUNTER — Ambulatory Visit (INDEPENDENT_AMBULATORY_CARE_PROVIDER_SITE_OTHER): Payer: BLUE CROSS/BLUE SHIELD | Admitting: Sports Medicine

## 2015-07-16 ENCOUNTER — Ambulatory Visit (INDEPENDENT_AMBULATORY_CARE_PROVIDER_SITE_OTHER): Payer: BLUE CROSS/BLUE SHIELD

## 2015-07-16 VITALS — BP 131/76 | HR 71 | Resp 18

## 2015-07-16 DIAGNOSIS — R52 Pain, unspecified: Secondary | ICD-10-CM

## 2015-07-16 DIAGNOSIS — S82891D Other fracture of right lower leg, subsequent encounter for closed fracture with routine healing: Secondary | ICD-10-CM

## 2015-07-16 DIAGNOSIS — S92301D Fracture of unspecified metatarsal bone(s), right foot, subsequent encounter for fracture with routine healing: Secondary | ICD-10-CM | POA: Diagnosis not present

## 2015-07-16 DIAGNOSIS — G5791 Unspecified mononeuropathy of right lower limb: Secondary | ICD-10-CM

## 2015-07-16 NOTE — Progress Notes (Signed)
Patient ID: Judy Prince, female   DOB: 09-21-1955, 60 y.o.   MRN: [id number]   Subjective: Judy Prince is a 60 y.o. female patient returns to office for follow up evaluation of Right ankle and foot pain at fracture sites; Injury of trip and fall occurred on 04-04-15. Patient states that pain is better; Patient states that she is back at work and has completed PT. Patient denies any other pedal complaints.   Hx of Osteoporosis on Fosamax  Patient Active Problem List   Diagnosis Date Noted  . Arteriosclerosis of coronary artery 12/20/2013  . HLD (hyperlipidemia) 12/20/2013  . OP (osteoporosis) 12/20/2013  . Avitaminosis D 12/20/2013  . Impingement syndrome of shoulder 11/29/2013   Current Outpatient Prescriptions on File Prior to Visit  Medication Sig Dispense Refill  . alendronate (FOSAMAX) 70 MG tablet Take by mouth.    Marland Kitchen aspirin EC 81 MG tablet Take by mouth.    . calcium acetate (PHOSLO) 667 MG tablet Take by mouth.    . cetirizine (ZYRTEC) 10 MG tablet Take by mouth.    . clopidogrel (PLAVIX) 75 MG tablet Take by mouth.    . fluticasone (FLONASE) 50 MCG/ACT nasal spray USE 2 SPRAYS IN EACH NOSTRIL EVERY DAY    . hydrochlorothiazide (HYDRODIURIL) 25 MG tablet Take by mouth.    Marland Kitchen HYDROcodone-acetaminophen (NORCO/VICODIN) 5-325 MG per tablet Take by mouth.    . metoprolol (LOPRESSOR) 50 MG tablet Take by mouth.    . pantoprazole (PROTONIX) 40 MG tablet TAKE 1 TABLET BY MOUTH ONCE A DAY    . rosuvastatin (CRESTOR) 20 MG tablet Take by mouth.    . temazepam (RESTORIL) 15 MG capsule Take by mouth.     No current facility-administered medications on file prior to visit.   No Known Allergies   Objective:  General: Alert and oriented x3 in no acute distress  Dermatology: No open lesions bilateral lower extremities, no webspace macerations, no ecchymosis right lateral ankle and foot, no rash to right leg, no fracture blisters, all nails x 10 are well manicured.  Vascular: Minimal  focal edema noted to right foot> ankle. Dorsalis Pedis and Posterior Tibial pedal pulses 2/4, Capillary Fill Time 3 seconds,(+) pedal hair growth bilateral, Temperature gradient within normal limits.  Neurology: Gross sensation intact via light touch bilateral. Decreased numbness to right foot.   Musculoskeletal: There is no tenderness with palpation at lateral ankle, fibula and 5th met on Right foot, No pain with tib-fib stress. There is no pain present with tuning fork to Right ankle/ foot, No pain with calf compression. All joint range of motion is within normal limits for patient status. Strength acceptable for patient status.  X-rays Right foot and ankle,  Compared to x-rays from 06-14-15 Impression: Fracture at the base of the fifth metatarsal is healing well; 100% improved/ prematurely healed. Fracture at the distal tip of the lateral malleolus minimally is healed; 100% improved/prematurely healed. No other acute findings.     Assessment and Plan: Problem List Items Addressed This Visit    None    Visit Diagnoses    Pain    -  Primary    Relevant Orders    DG Foot Complete Right    DG Ankle 2 Views Right    Closed right ankle fracture, with routine healing, subsequent encounter        improved    Closed fracture of metatarsal bone, right, with routine healing, subsequent encounter  improved    Neuritis of foot, right        improved      -Complete examination performed -Xrays reviewed  -Discussed treatement and plan of care for fracture; risks, alternatives, and benefits explained. -PT completed -Cont with Compression Anklet assist with edema control  -Dispensed Surgical shoe to use for 2 weeks with slow transition to normal shoe as instructed -Recommend protection, rest, ice, elevation daily until symptoms resolve -Cont with Tylenol as needed for pain as needed -Work note: Patient cont with regular work duty with use of post op Sport and exercise psychologist for assistance in gait  for the next 2 weeks and then to normal shoe. Patient may navigate via steps. No prolonged walking or standing. No heavy lifting >10 lbs.  -Patient to return to office in 4 weeks for serial x-rays to assess final healing or sooner if condition worsens  Landis Martins, DPM

## 2015-07-19 ENCOUNTER — Ambulatory Visit: Payer: BLUE CROSS/BLUE SHIELD | Admitting: Sports Medicine

## 2015-08-16 ENCOUNTER — Encounter: Payer: Self-pay | Admitting: Sports Medicine

## 2015-08-16 ENCOUNTER — Ambulatory Visit (INDEPENDENT_AMBULATORY_CARE_PROVIDER_SITE_OTHER): Payer: BLUE CROSS/BLUE SHIELD

## 2015-08-16 ENCOUNTER — Ambulatory Visit (INDEPENDENT_AMBULATORY_CARE_PROVIDER_SITE_OTHER): Payer: BLUE CROSS/BLUE SHIELD | Admitting: Sports Medicine

## 2015-08-16 DIAGNOSIS — G5791 Unspecified mononeuropathy of right lower limb: Secondary | ICD-10-CM | POA: Diagnosis not present

## 2015-08-16 DIAGNOSIS — M79671 Pain in right foot: Secondary | ICD-10-CM | POA: Diagnosis not present

## 2015-08-16 DIAGNOSIS — S92301D Fracture of unspecified metatarsal bone(s), right foot, subsequent encounter for fracture with routine healing: Secondary | ICD-10-CM | POA: Diagnosis not present

## 2015-08-16 DIAGNOSIS — S82891D Other fracture of right lower leg, subsequent encounter for closed fracture with routine healing: Secondary | ICD-10-CM | POA: Diagnosis not present

## 2015-08-16 NOTE — Progress Notes (Addendum)
Patient ID: SHEREEN MARTON, female   DOB: 04-22-1955, 60 y.o.   MRN: [id number]  Subjective: ALIXIS HARMON is a 60 y.o. female patient returns to office for follow up evaluation of Right ankle and foot pain at fracture sites; Injury of trip and fall occurred on 04-04-15. Patient states that pain is better but with still swelling and tingling. Patient denies any other pedal complaints.   Hx of Osteoporosis on Fosamax  Patient Active Problem List   Diagnosis Date Noted  . 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 Arteriosclerosis of coronary artery 12/20/2013  . HLD (hyperlipidemia) 12/20/2013  . OP (osteoporosis) 12/20/2013  . Avitaminosis D 12/20/2013  . Impingement syndrome of shoulder 11/29/2013   Current Outpatient Prescriptions on File Prior to Visit  Medication Sig Dispense Refill  . alendronate (FOSAMAX) 70 MG tablet Take by mouth.    Marland Kitchen aspirin EC 81 MG tablet Take by mouth.    . calcium acetate (PHOSLO) 667 MG tablet Take by mouth.    . cetirizine (ZYRTEC) 10 MG tablet Take by mouth.    . clopidogrel (PLAVIX) 75 MG tablet Take by mouth.    . fluticasone (FLONASE) 50 MCG/ACT nasal spray USE 2 SPRAYS IN EACH NOSTRIL EVERY DAY    . hydrochlorothiazide (HYDRODIURIL) 25 MG tablet Take by mouth.    Marland Kitchen HYDROcodone-acetaminophen (NORCO/VICODIN) 5-325 MG per tablet Take by mouth.    . metoprolol (LOPRESSOR) 50 MG tablet Take by mouth.    . pantoprazole (PROTONIX) 40 MG tablet TAKE 1 TABLET BY MOUTH ONCE A DAY    . rosuvastatin (CRESTOR) 20 MG tablet Take by mouth.    . temazepam (RESTORIL) 15 MG capsule Take by mouth.     No current facility-administered medications on file prior to visit.   No Known Allergies   Objective:  General: Alert and oriented x3 in no acute distress  Dermatology: No open lesions bilateral lower extremities, no webspace macerations, no ecchymosis right lateral ankle and foot, no rash to right leg, no fracture blisters, all nails x 10 are  well manicured.  Vascular: Minimal focal edema noted to right foot> ankle. Dorsalis Pedis and Posterior Tibial pedal pulses 2/4, Capillary Fill Time 3 seconds,(+) pedal hair growth bilateral, Temperature gradient within normal limits.  Neurology: Gross sensation intact via light touch bilateral. Decreased numbness to right foot.   Musculoskeletal: There is no frank tenderness with palpation at lateral ankle, fibula and 5th met on Right foot, Subjective tenderness with standing. No pain with tib-fib stress. There is no pain present with tuning fork to Right ankle/ foot, No pain with calf compression. All joint range of motion is within normal limits for patient status. Strength acceptable for patient status.  X-rays Right foot and ankle,  Compared to x-rays from 07-16-15 Impression: Fracture at the base of the fifth metatarsal is 100% improved/ healed. Fracture at the distal tip of the lateral malleolus is healed. No other acute findings.   Assessment and Plan: Problem List Items Addressed This Visit    None    Visit Diagnoses    Right foot pain    -  Primary    Relevant Orders    DG Foot Complete Right    DG Ankle 2 Views Right    Closed right ankle fracture, with routine healing, subsequent encounter        Healed    Closed fracture of metatarsal bone, right, with routine healing, subsequent encounter  Healed    Neuritis of foot, right          -Complete examination performed -Xrays reviewed  -Discussed treatement and plan of care for fracture; risks, alternatives, and benefits explained. -Cont with Compression Anklet or compression stockings to assist with edema control  -Cont with good supportive sneakers and activities as tolerated -Recommend protection, rest, ice, contrast baths, elevation daily until symptoms resolve -Cont with Tylenol as needed for pain as needed -Advised OTC capsician cream for neuritis -Work note: Patient cont with regular work duty no  restrictions -Patient to return to office in 12 weeks for follow up evaluation or sooner if condition worsens  Landis Martins, DPM

## 2015-09-23 ENCOUNTER — Other Ambulatory Visit: Payer: Self-pay | Admitting: Internal Medicine

## 2015-09-23 DIAGNOSIS — Z1231 Encounter for screening mammogram for malignant neoplasm of breast: Secondary | ICD-10-CM

## 2015-09-26 DIAGNOSIS — R7989 Other specified abnormal findings of blood chemistry: Secondary | ICD-10-CM | POA: Insufficient documentation

## 2015-09-26 DIAGNOSIS — I10 Essential (primary) hypertension: Secondary | ICD-10-CM | POA: Insufficient documentation

## 2015-10-09 ENCOUNTER — Ambulatory Visit
Admission: RE | Admit: 2015-10-09 | Discharge: 2015-10-09 | Disposition: A | Discharge status: Home / Self Care | Payer: BLUE CROSS/BLUE SHIELD | Source: Ambulatory Visit | Attending: Internal Medicine | Admitting: Internal Medicine

## 2015-10-09 ENCOUNTER — Other Ambulatory Visit: Payer: Self-pay | Admitting: Internal Medicine

## 2015-10-09 DIAGNOSIS — Z1231 Encounter for screening mammogram for malignant neoplasm of breast: Secondary | ICD-10-CM | POA: Diagnosis present

## 2015-11-22 ENCOUNTER — Ambulatory Visit: Payer: BLUE CROSS/BLUE SHIELD | Admitting: Sports Medicine

## 2015-11-25 ENCOUNTER — Ambulatory Visit (INDEPENDENT_AMBULATORY_CARE_PROVIDER_SITE_OTHER): Payer: BLUE CROSS/BLUE SHIELD | Admitting: Podiatry

## 2015-11-25 ENCOUNTER — Encounter: Payer: Self-pay | Admitting: Podiatry

## 2015-11-25 DIAGNOSIS — G629 Polyneuropathy, unspecified: Secondary | ICD-10-CM | POA: Diagnosis not present

## 2015-11-25 NOTE — Progress Notes (Signed)
She presents for follow-up of her right ankle fracture stating that she has numbness and tingling across the top of her foot extending up her leg as well as the lateral aspect of her foot exiting of the posterior aspect of the leg. She has normal sensation of the plantar aspect of the foot according to her. She is also having some numbness and tingling in the left foot as well. Relates a history of back problems particularly in the thoracic and cervical areas. She also relates polydipsia polyuria. Relates family history of diabetes. She states that she has had some relief from her symptoms with her chiropractic doctor.  Objective: Vital signs are stable she's alert and oriented 3 pulses are palpable. Neurologic sensorium is intact per Semmes-Weinstein monofilament. Deep tendon reflexes are intact muscle strength is normal. Orthopedic evaluation demonstrates no edema saline as drainage or odor. Radiographs taken today in the office demonstrate 3 views of the right ankle and foot demonstrate a well-healed fracture.  Assessment: Concerned about diabetic peripheral neuropathy. Well-healed fracture.  Plan: I encouraged her to follow up with her chiropractic doctor but at the same time to make appointment for her primary care provider for hemoglobin A1c to be performed. Otherwise she will need to follow up with her neurologist.

## 2016-03-20 ENCOUNTER — Encounter
Admission: RE | Admit: 2016-03-20 | Discharge: 2016-03-20 | Disposition: A | Discharge status: Home / Self Care | Payer: BLUE CROSS/BLUE SHIELD | Source: Ambulatory Visit | Attending: Orthopedic Surgery | Admitting: Orthopedic Surgery

## 2016-03-20 DIAGNOSIS — M810 Age-related osteoporosis without current pathological fracture: Secondary | ICD-10-CM | POA: Diagnosis not present

## 2016-03-20 DIAGNOSIS — M1990 Unspecified osteoarthritis, unspecified site: Secondary | ICD-10-CM | POA: Diagnosis not present

## 2016-03-20 DIAGNOSIS — R001 Bradycardia, unspecified: Secondary | ICD-10-CM | POA: Diagnosis not present

## 2016-03-20 DIAGNOSIS — I2510 Atherosclerotic heart disease of native coronary artery without angina pectoris: Secondary | ICD-10-CM | POA: Diagnosis not present

## 2016-03-20 DIAGNOSIS — Z01812 Encounter for preprocedural laboratory examination: Secondary | ICD-10-CM | POA: Insufficient documentation

## 2016-03-20 DIAGNOSIS — E785 Hyperlipidemia, unspecified: Secondary | ICD-10-CM | POA: Diagnosis not present

## 2016-03-20 DIAGNOSIS — Z01810 Encounter for preprocedural cardiovascular examination: Secondary | ICD-10-CM | POA: Insufficient documentation

## 2016-03-20 DIAGNOSIS — I10 Essential (primary) hypertension: Secondary | ICD-10-CM | POA: Insufficient documentation

## 2016-03-20 HISTORY — DX: Atherosclerotic heart disease of native coronary artery without angina pectoris: I25.10

## 2016-03-20 HISTORY — DX: Personal history of urinary calculi: Z87.442

## 2016-03-20 LAB — CBC
HEMATOCRIT: 42.6 % (ref 35.0–47.0)
HEMOGLOBIN: 14.6 g/dL (ref 12.0–16.0)
MCH: 30.2 pg (ref 26.0–34.0)
MCHC: 34.3 g/dL (ref 32.0–36.0)
MCV: 87.9 fL (ref 80.0–100.0)
Platelets: 259 10*3/uL (ref 150–440)
RBC: 4.84 MIL/uL (ref 3.80–5.20)
RDW: 12.7 % (ref 11.5–14.5)
WBC: 6.5 10*3/uL (ref 3.6–11.0)

## 2016-03-20 LAB — POTASSIUM: Potassium: 2.9 mmol/L — ABNORMAL LOW (ref 3.5–5.1)

## 2016-03-20 NOTE — Patient Instructions (Signed)
  Your procedure is scheduled on: 03-26-16 [facility]) Report to Same Day Surgery 2nd floor medical mall [facility] Entrance-take elevator on left to 2nd floor.  Check in with surgery information desk.) To find out your arrival time please call [phone] between 1PM - 3PM on 03-25-16 [facility])  Remember: Instructions that are not followed completely may result in serious medical risk, up to and including death, or upon the discretion of your surgeon and anesthesiologist your surgery may need to be rescheduled.    _x___ 1. Do not eat food or drink liquids after midnight. No gum chewing or hard candies.     __x__ 2. No Alcohol for 24 hours before or after surgery.   __x__3. No Smoking for 24 prior to surgery.   ____  4. Bring all medications with you on the day of surgery if instructed.    __x__ 5. Notify your doctor if there is any change in your medical condition     (cold, fever, infections).     Do not wear jewelry, make-up, hairpins, clips or nail polish.  Do not wear lotions, powders, or perfumes. You may wear deodorant.  Do not shave 48 hours prior to surgery. Men may shave face and neck.  Do not bring valuables to the hospital.    [facility] is not responsible for any belongings or valuables.               Contacts, dentures or bridgework may not be worn into surgery.  Leave your suitcase in the car. After surgery it may be brought to your room.  For patients admitted to the hospital, discharge time is determined by your treatment team.   Patients discharged the day of surgery will not be allowed to drive home.  You will need someone to drive you home and stay with you the night of your procedure.    Please read over the following fact sheets that you were given:   [facility] Preparing for Surgery and or MRSA Information   _x___ Take these medicines the morning of surgery with A SIP OF WATER:    1. METOPROLOL  2. SERTRALINE (ZOLOFT)  3. PANTOPRAZOLE  (PROTONIX)  4. TAKE A PROTONIX Wednesday NIGHT BEFORE BED (03-25-16)  5.  6.  ____Fleets enema or Magnesium Citrate as directed.   _x___ Use CHG Soap or sage wipes as directed on instruction sheet   ____ Use inhalers on the day of surgery and bring to hospital day of surgery  ____ Stop metformin 2 days prior to surgery    ____ Take 1/2 of usual insulin dose the night before surgery and none on the morning of           surgery.   ____ Stop Aspirin, Coumadin, Pllavix ,Eliquis, Effient, or Pradaxa-OK TO CONTINUE PLAVIX PER PT  (PER DR MENZ)-DO NOT TAKE AM OF SURGERY-CALL DR MENZ'S OFFICE REGARDING ASPIRIN  x__ Stop Anti-inflammatories such as Advil, Aleve, Ibuprofen, Motrin, Naproxen,          Naprosyn, Goodies powders or aspirin products. Ok to take Tylenol.   ____ Stop supplements until after surgery.    ____ Bring C-Pap to the hospital.

## 2016-03-20 NOTE — Pre-Procedure Instructions (Signed)
I HAVE TRIED TO REACH PT MULTIPLE TIMES TO INSTRUCT HER ON EATING POTASSIUM RICH FOODS THIS WEEKEND UNTIL SOMEONE CAN CALL HER IN A PRESCRIPTION FOR K+ ON Tuesday ONCE THE OFFICE COMES BACK FROM [place].

## 2016-03-20 NOTE — Pre-Procedure Instructions (Signed)
CALLED CINDY AT [facility] ABOUT PTS 2.9 K+ AND HAD TO LEAVE A MESSAGE-CALLED THE MAIN [place] # AND THE OFFICE IS CLOSED FOR [place].  CALLED PT AT HOME AND LEFT HER A MESSAGE TO CALL ME BACK.  ALSO TRIED HER WORK # BUT WAS UNABLE TO REACH PT AT WORK. AWAITING PTS CALL

## 2016-03-20 NOTE — Pre-Procedure Instructions (Signed)
CALLED PTS CELL PHONE # 3 TIMES AND SHE HAS A VOICE MAIL THAT HAS NOT BEEN SET UP SO I AM UNABLE TO LEAVE PT A MESSAGE

## 2016-03-20 NOTE — Pre-Procedure Instructions (Signed)
PT FINALLY CALLED ME BACK AND IN INSTRUCTED HER TO MAKE SURE SHE EATS POTASSIUM RICH FOODS THIS WEEKEND SUCH AS AVOCADOS, SWEET POTATOES, BANANAS, SALMON, ORANGE JUICE ETC. I INSTRUCTED PT SHE COULD GOOGLE K+ RICH FOODS ON LINE AND IT WILL GIVE HER A LONG LIST OF FOODS TO CONCENTRATE ON THIS WEEKEND UNTIL SUPPLEMENT CAN BE CALLED IN. PT VERBALIZED UNDERSTANDING

## 2016-03-20 NOTE — Pre-Procedure Instructions (Signed)
Stress echo with physician supervision6/27/2016 [place], [place] #: [id number]  [place], [place] [address] Date: 09/24/2014 09:26 AM  Adult Female Age: 60 yrs  ECHOCARDIOGRAM REPORT Outpatient STUDY:Stress EchoTAPE: KC::KCWI  ECHO:Yes DOPPLER:YesFILE:[id number] MD1:MILLER, [place] COLOR:YesCONTRAST:NoMACHINE:PhilipsHeight: 27 in RV BIOPSY:No 3D:No SOUND QLTY:Moderate Weight: 165 lb  MEDIUM:None  BSA: 1.8 m2 ___________________________________________________________________________________________ HISTORY:Coronary Artery Disease  REASON:Assess, LV function  Indication:I25.10 Coronary artery disease involving native c  ___________________________________________________________________________________________ STRESS ECHOCARDIOGRAPHY   Protocol:Treadmill (Modified Bruce) Drugs:None Target Heart Rate:135 bpmMaximum Predicted Heart Rate: 159 bpm  +-------------------+-------------------------+-------------------------+------------+  Stage  Duration (mm:ss) Heart Rate (bpm) BP  +-------------------+-------------------------+-------------------------+------------+ RESTING 74  141/70   +-------------------+-------------------------+-------------------------+------------+ EXERCISE  2:33 148  / +-------------------+-------------------------+-------------------------+------------+ RECOVERY  7:1673  157/76  +-------------------+-------------------------+-------------------------+------------+  Stress Duration:2:33 mm:ss Max Stress H.R.:148 bpmTarget Heart Rate Achieved: Yes Maximum workload of 7.00 METS was achieved during exercise   ___________________________________________________________________________________________ WALL SEGMENT CHANGES  RestStress Anterior Septum Basal:NormalHyperkinetic [id number]  Apical:NormalHyperkinetic  Anterior Wall Basal:NormalHyperkinetic [id number]  Apical:NormalHyperkinetic   Lateral Wall Basal:NormalHyperkinetic [id number]  Apical:NormalHyperkinetic   Posterior Wall Basal:NormalHyperkinetic [id number]  Inferior Wall Basal:NormalHyperkinetic [id number]  Apical:NormalHyperkinetic  Inferior Septum Basal:NormalHyperkinetic [id number]   Resting EF:>55% (Est.) Stress EF: >55% (Est.)   ___________________________________________________________________________________________ ADDITIONAL FINDINGS  Chest Discomfort:None Arrhythmia:None Termination Reason:Fatigue  Adverse Effects:None   Complications:None   ___________________________________________________________________________________________ STRESS ECG RESULTS   ECG Results:Normal  ___________________________________________________________________________________________  ECHOCARDIOGRAPHIC DESCRIPTIONS  LEFT VENTRICLE Size:Normal  Contraction:Normal  LV Masses:No Masses  OM:[id number] Dias.FxClass:Normal  RIGHT VENTRICLE Size:Normal Free Wall:Normal  Contraction:Normal RV Masses:No mass  PERICARDIUM  Fluid:No effusion  _______________________________________________________________________________________  DOPPLER ECHO and OTHER SPECIAL PROCEDURES  Aortic:TRIVIAL AR No AS   Mitral:TRIVIAL MR No MS MV Inflow E Vel=nm*MV Annulus E'Vel=nm* E/E'Ratio=nm*  Tricuspid:MILD TRNo TS 252.0 cm/sec peak TR vel 35.4 mmHg peak RV pressure  Pulmonary:TRIVIAL PR No PS   ___________________________________________________________________________________________  ECHOCARDIOGRAPHIC MEASUREMENTS 2D DIMENSIONS AORTA ValuesNormal RangeMAIN PAValuesNormal Range Annulus:nm* [2.1 - 2.5]PA Main:nm* [1.5 - 2.1] Aorta Sin:nm* [2.7 - 3.3] RIGHT VENTRICLE ST Junction:nm* [2.3 - 2.9]RV Base:nm* [ < 4.2] Asc.Aorta:nm* [2.3 - 3.1] RV Mid:nm* [ < 3.5]  LEFT VENTRICLERV Length:nm* [ < 8.6] LVIDd:nm* [3.9 - 5.3] INFERIOR VENA CAVA LVIDs:nm* Max. IVC:nm* [ <= 2.1]  FS:nm* [>  25]Min. IVC:nm* SWT:nm* [0.5 - 0.9] ------------------ PWT:nm* [0.5 - 0.9] nm* - not measured  LEFT ATRIUM LA Diam:nm* [2.7 - 3.8] LA A4C Area:nm* [ < 20] LA Volume:nm* [22 - [id number]  ___________________________________________________________________________________________ INTERPRETATION Normal Stress Echocardiogram NORMAL RIGHT VENTRICULAR SYSTOLIC FUNCTION NO VALVULAR REGURGITATION NOTED NO VALVULAR STENOSIS NOTED   ___________________________________________________________________________________________  Electronically signed by: Rusty Aus, MD on 09/24/2014 02:04 PM Performed By: Scherrie November, RCS Ordering Physician: Emily Filbert ___________________________________________________________________________________________  Status Results Details    Appointment on 03/30/1839 Louann")' href="[email or web address]/">Encounter Summary

## 2016-03-25 MED ORDER — CEFAZOLIN SODIUM-DEXTROSE 2-4 GM/100ML-% IV SOLN
2.0000 g | Freq: Once | INTRAVENOUS | Status: AC
  Administered 2016-03-26: 2 g via INTRAVENOUS

## 2016-03-26 ENCOUNTER — Encounter
Admission: RE | Disposition: A | Discharge status: Home / Self Care | Payer: Self-pay | Source: Ambulatory Visit | Attending: Orthopedic Surgery

## 2016-03-26 ENCOUNTER — Ambulatory Visit
Admission: RE | Admit: 2016-03-26 | Discharge: 2016-03-26 | Disposition: A | Discharge status: Home / Self Care | Payer: BLUE CROSS/BLUE SHIELD | Source: Ambulatory Visit | Attending: Orthopedic Surgery | Admitting: Orthopedic Surgery

## 2016-03-26 ENCOUNTER — Ambulatory Visit: Payer: BLUE CROSS/BLUE SHIELD | Admitting: Anesthesiology

## 2016-03-26 DIAGNOSIS — R7303 Prediabetes: Secondary | ICD-10-CM | POA: Insufficient documentation

## 2016-03-26 DIAGNOSIS — I10 Essential (primary) hypertension: Secondary | ICD-10-CM | POA: Insufficient documentation

## 2016-03-26 DIAGNOSIS — M797 Fibromyalgia: Secondary | ICD-10-CM | POA: Insufficient documentation

## 2016-03-26 DIAGNOSIS — M810 Age-related osteoporosis without current pathological fracture: Secondary | ICD-10-CM | POA: Insufficient documentation

## 2016-03-26 DIAGNOSIS — K219 Gastro-esophageal reflux disease without esophagitis: Secondary | ICD-10-CM | POA: Diagnosis not present

## 2016-03-26 DIAGNOSIS — F410 Panic disorder [episodic paroxysmal anxiety] without agoraphobia: Secondary | ICD-10-CM | POA: Insufficient documentation

## 2016-03-26 DIAGNOSIS — Z87442 Personal history of urinary calculi: Secondary | ICD-10-CM | POA: Diagnosis not present

## 2016-03-26 DIAGNOSIS — M19042 Primary osteoarthritis, left hand: Secondary | ICD-10-CM | POA: Insufficient documentation

## 2016-03-26 DIAGNOSIS — I2510 Atherosclerotic heart disease of native coronary artery without angina pectoris: Secondary | ICD-10-CM | POA: Insufficient documentation

## 2016-03-26 DIAGNOSIS — F329 Major depressive disorder, single episode, unspecified: Secondary | ICD-10-CM | POA: Diagnosis not present

## 2016-03-26 DIAGNOSIS — K589 Irritable bowel syndrome without diarrhea: Secondary | ICD-10-CM | POA: Diagnosis not present

## 2016-03-26 DIAGNOSIS — E785 Hyperlipidemia, unspecified: Secondary | ICD-10-CM | POA: Insufficient documentation

## 2016-03-26 HISTORY — PX: FINGER ARTHRODESIS: SHX5000

## 2016-03-26 LAB — POCT I-STAT 4, (NA,K, GLUC, HGB,HCT)
GLUCOSE: 101 mg/dL — AB (ref 65–99)
HCT: 43 % (ref 36.0–46.0)
Hemoglobin: 14.6 g/dL (ref 12.0–15.0)
POTASSIUM: 3.3 mmol/L — AB (ref 3.5–5.1)
SODIUM: 141 mmol/L (ref 135–145)

## 2016-03-26 SURGERY — FUSION, JOINT, FINGER
Anesthesia: General | Site: Index Finger | Laterality: Left | Wound class: Clean

## 2016-03-26 MED ORDER — PROPOFOL 10 MG/ML IV BOLUS
INTRAVENOUS | Status: AC
  Filled 2016-03-26: qty 20

## 2016-03-26 MED ORDER — HYDROCODONE-ACETAMINOPHEN 5-325 MG PO TABS: 1.0000 | ORAL_TABLET | Freq: Four times a day (QID) | ORAL | 0 refills | Status: DC | PRN

## 2016-03-26 MED ORDER — FENTANYL CITRATE (PF) 100 MCG/2ML IJ SOLN
INTRAMUSCULAR | Status: AC
  Filled 2016-03-26: qty 2

## 2016-03-26 MED ORDER — CEFAZOLIN SODIUM-DEXTROSE 2-4 GM/100ML-% IV SOLN
INTRAVENOUS | Status: AC
  Filled 2016-03-26: qty 100

## 2016-03-26 MED ORDER — FENTANYL CITRATE (PF) 100 MCG/2ML IJ SOLN
INTRAMUSCULAR | Status: DC | PRN
  Administered 2016-03-26: 100 ug via INTRAVENOUS

## 2016-03-26 MED ORDER — PROMETHAZINE HCL 25 MG/ML IJ SOLN: 6.2500 mg | INTRAMUSCULAR | Status: DC | PRN

## 2016-03-26 MED ORDER — BUPIVACAINE HCL (PF) 0.5 % IJ SOLN
INTRAMUSCULAR | Status: DC | PRN
  Administered 2016-03-26: 20 mL

## 2016-03-26 MED ORDER — MIDAZOLAM HCL 2 MG/2ML IJ SOLN
INTRAMUSCULAR | Status: DC | PRN
  Administered 2016-03-26: 2 mg via INTRAVENOUS

## 2016-03-26 MED ORDER — LIDOCAINE HCL (CARDIAC) 20 MG/ML IV SOLN
INTRAVENOUS | Status: DC | PRN
  Administered 2016-03-26: 100 mg via INTRAVENOUS

## 2016-03-26 MED ORDER — LIDOCAINE 2% (20 MG/ML) 5 ML SYRINGE
INTRAMUSCULAR | Status: AC
  Filled 2016-03-26: qty 5

## 2016-03-26 MED ORDER — METOCLOPRAMIDE HCL 5 MG/ML IJ SOLN: 5.0000 mg | Freq: Three times a day (TID) | INTRAMUSCULAR | Status: DC | PRN

## 2016-03-26 MED ORDER — PROPOFOL 10 MG/ML IV BOLUS
INTRAVENOUS | Status: DC | PRN
  Administered 2016-03-26 (×2): 50 mg via INTRAVENOUS
  Administered 2016-03-26: 150 mg via INTRAVENOUS

## 2016-03-26 MED ORDER — ONDANSETRON HCL 4 MG/2ML IJ SOLN: 4.0000 mg | Freq: Four times a day (QID) | INTRAMUSCULAR | Status: DC | PRN

## 2016-03-26 MED ORDER — SODIUM CHLORIDE 0.9 % IV SOLN: INTRAVENOUS | Status: DC

## 2016-03-26 MED ORDER — METOCLOPRAMIDE HCL 10 MG PO TABS: 5.0000 mg | ORAL_TABLET | Freq: Three times a day (TID) | ORAL | Status: DC | PRN

## 2016-03-26 MED ORDER — MIDAZOLAM HCL 2 MG/2ML IJ SOLN
INTRAMUSCULAR | Status: AC
Start: 2016-03-26 — End: 2016-03-26
  Filled 2016-03-26: qty 2

## 2016-03-26 MED ORDER — FENTANYL CITRATE (PF) 100 MCG/2ML IJ SOLN: 25.0000 ug | INTRAMUSCULAR | Status: DC | PRN

## 2016-03-26 MED ORDER — ONDANSETRON HCL 4 MG PO TABS: 4.0000 mg | ORAL_TABLET | Freq: Four times a day (QID) | ORAL | Status: DC | PRN

## 2016-03-26 MED ORDER — DEXAMETHASONE SODIUM PHOSPHATE 10 MG/ML IJ SOLN
INTRAMUSCULAR | Status: DC | PRN
  Administered 2016-03-26: 10 mg via INTRAVENOUS

## 2016-03-26 MED ORDER — BUPIVACAINE HCL (PF) 0.5 % IJ SOLN
INTRAMUSCULAR | Status: AC
  Filled 2016-03-26: qty 30

## 2016-03-26 MED ORDER — HYDROCODONE-ACETAMINOPHEN 5-325 MG PO TABS: 1.0000 | ORAL_TABLET | ORAL | Status: DC | PRN

## 2016-03-26 MED ORDER — ONDANSETRON HCL 4 MG/2ML IJ SOLN
INTRAMUSCULAR | Status: DC | PRN
  Administered 2016-03-26: 4 mg via INTRAVENOUS

## 2016-03-26 MED ORDER — SEVOFLURANE IN SOLN
RESPIRATORY_TRACT | Status: AC
  Filled 2016-03-26: qty 250

## 2016-03-26 MED ORDER — NEOMYCIN-POLYMYXIN B GU 40-200000 IR SOLN
Status: AC
  Filled 2016-03-26: qty 2

## 2016-03-26 MED ORDER — LACTATED RINGERS IV SOLN
INTRAVENOUS | Status: DC
  Administered 2016-03-26 (×2): via INTRAVENOUS

## 2016-03-26 SURGICAL SUPPLY — 39 items
BANDAGE ELASTIC 4 LF NS (GAUZE/BANDAGES/DRESSINGS) ×3 IMPLANT
BIT DRILL 24 ACUTRAK FUSION (BIT) ×3 IMPLANT
BNDG COHESIVE 1X5 TAN NS LF (GAUZE/BANDAGES/DRESSINGS) ×3 IMPLANT
BNDG COHESIVE 4X5 TAN STRL (GAUZE/BANDAGES/DRESSINGS) ×3 IMPLANT
BNDG ESMARK 4X12 TAN STRL LF (GAUZE/BANDAGES/DRESSINGS) ×3 IMPLANT
BNDG GAUZE 1X2.1 STRL (MISCELLANEOUS) ×3 IMPLANT
BUR RND POLISHING (BURR) ×3 IMPLANT
CHLORAPREP W/TINT 26ML (MISCELLANEOUS) ×3 IMPLANT
CUFF TOURN 18 STER (MISCELLANEOUS) IMPLANT
DEVICE FUSION 20.0 ACUTRAK (Screw) ×1 IMPLANT
DRAPE FLUOR MINI C-ARM 54X84 (DRAPES) ×3 IMPLANT
ELECT CAUTERY BLADE 6.4 (BLADE) ×3 IMPLANT
ELECT REM PT RETURN 9FT ADLT (ELECTROSURGICAL) ×3
ELECTRODE REM PT RTRN 9FT ADLT (ELECTROSURGICAL) ×1 IMPLANT
FUSION DEVICE 20.0 ACUTRAK (Screw) ×3 IMPLANT
GAUZE SPONGE 4X4 12PLY STRL (GAUZE/BANDAGES/DRESSINGS) ×3 IMPLANT
GLOVE BIOGEL PI IND STRL 9 (GLOVE) ×1 IMPLANT
GLOVE BIOGEL PI INDICATOR 9 (GLOVE) ×2
GLOVE SURG SYN 9.0  PF PI (GLOVE) ×2
GLOVE SURG SYN 9.0 PF PI (GLOVE) ×1 IMPLANT
GOWN SRG 2XL LVL 4 RGLN SLV (GOWNS) ×1 IMPLANT
GOWN STRL NON-REIN 2XL LVL4 (GOWNS) ×2
GOWN STRL REUS W/ TWL LRG LVL3 (GOWN DISPOSABLE) ×1 IMPLANT
GOWN STRL REUS W/TWL LRG LVL3 (GOWN DISPOSABLE) ×2
GUIDEWIRE ORTHO 062 (WIRE) ×3 IMPLANT
IV CATH ANGIO 14GX3.25 ORG (MISCELLANEOUS) ×3 IMPLANT
KIT RM TURNOVER STRD PROC AR (KITS) ×3 IMPLANT
NEEDLE FILTER BLUNT 18X 1/2SAF (NEEDLE) ×2
NEEDLE FILTER BLUNT 18X1 1/2 (NEEDLE) ×1 IMPLANT
NEEDLE HYPO 25X1 1.5 SAFETY (NEEDLE) ×3 IMPLANT
NS IRRIG 500ML POUR BTL (IV SOLUTION) ×3 IMPLANT
PACK EXTREMITY ARMC (MISCELLANEOUS) ×3 IMPLANT
PAD PREP 24X41 OB/GYN DISP (PERSONAL CARE ITEMS) ×3 IMPLANT
PADDING CAST BLEND 4X4 NS (MISCELLANEOUS) ×3 IMPLANT
STOCKINETTE BIAS CUT 3 980034 (MISCELLANEOUS) IMPLANT
STOCKINETTE STRL 4IN 9604848 (GAUZE/BANDAGES/DRESSINGS) ×3 IMPLANT
SUT ETHIBOND 4-0 (SUTURE) ×3 IMPLANT
SUT ETHILON 5 0 CL P 3 (SUTURE) ×3 IMPLANT
SYRINGE 10CC LL (SYRINGE) ×3 IMPLANT

## 2016-03-26 NOTE — Op Note (Signed)
03/26/2016  10:46 AM  PATIENT:  Judy Prince  60 y.o. female  PRE-OPERATIVE DIAGNOSIS:  OSTEOARTHRITIS OF FINGER-LEFT  POST-OPERATIVE DIAGNOSIS:  OSTEOARTHRITIS OF FINGER-LEFT  PROCEDURE:  Procedure(s): ARTHRODESIS FINGER (Left)  SURGEON: Laurene Footman, MD  ASSISTANTS: None  ANESTHESIA:   general  EBL:  Total I/O In: -  Out: 5 [Blood:5]  BLOOD ADMINISTERED:none  DRAINS: none   LOCAL MEDICATIONS USED:  MARCAINE     SPECIMEN:  No Specimen  DISPOSITION OF SPECIMEN:  N/A  COUNTS:  YES  TOURNIQUET:    IMPLANTS: Acumed fusion screw 20 mm  DICTATION: .Dragon Dictation patient was brought to the operating room and after adequate general anesthesia was obtained left arm was prepped and draped in sterile fashion. After appropriate patient identification and timeout procedures were completed, a digital block using 20 cc of half percent Sensorcaine was given. The tourniquet was then raised and a T-shaped incision was made over the DIP joint of the left index finger. They're asked large spurs dorsally these were removed with use of a spur upper. After denuding the bony surfaces getting to cortical bone a K wire was sent out through the tip of the distal phalanx and then driven retrograde measurement made off of this followed by hand drilling with the Accu Trac system and each one 20 mm screw was inserted getting good compression at the fusion site with appropriate rotation. The screw was sunk so would be within the bone and not prominent in the pulp of the finger. There was very good alignment on both AP and lateral projections. At this point the wound was irrigated and closed with simple interrupted 4-0 nylon skin sutures followed by Xeroform 2 x 2's and a finger roll tourniquet let down prior to dressing applied. No complications no specimen  PLAN OF CARE: Discharge to home after PACU  PATIENT DISPOSITION:  PACU - hemodynamically stable.

## 2016-03-26 NOTE — Anesthesia Procedure Notes (Signed)
Procedure Name: LMA Insertion Date/Time: 03/26/2016 10:03 AM Performed by: Nelda Marseille Pre-anesthesia Checklist: Patient identified, Patient being monitored, Timeout performed, Emergency Drugs available and Suction available Patient Re-evaluated:Patient Re-evaluated prior to inductionOxygen Delivery Method: Circle system utilized Preoxygenation: Pre-oxygenation with 100% oxygen Intubation Type: IV induction Ventilation: Mask ventilation without difficulty LMA: LMA inserted LMA Size: 3.0 Tube type: Oral Number of attempts: 1 Placement Confirmation: positive ETCO2 and breath sounds checked- equal and bilateral Tube secured with: Tape Dental Injury: Teeth and Oropharynx as per pre-operative assessment

## 2016-03-26 NOTE — H&P (Signed)
Reviewed paper H+P, will be scanned into chart. No changes noted.  

## 2016-03-26 NOTE — Transfer of Care (Signed)
Immediate Anesthesia Transfer of Care Note  Patient: Judy Prince  Procedure(s) Performed: Procedure(s): ARTHRODESIS FINGER (Left)  Patient Location: PACU  Anesthesia Type:General  Level of Consciousness: sedated  Airway & Oxygen Therapy: Patient Spontanous Breathing and Patient connected to face mask oxygen  Post-op Assessment: Report given to RN and Post -op Vital signs reviewed and stable  Post vital signs: Reviewed and stable  Last Vitals:  Vitals:   03/26/16 0826  BP: (!) 125/47  Pulse: 68  Resp: 16  Temp: 36.8 C    Last Pain:  Vitals:   03/26/16 0826  TempSrc: Tympanic  PainSc: 4          Complications: No apparent anesthesia complications

## 2016-03-26 NOTE — Discharge Instructions (Addendum)
Reinforce dressing if bleeding comes through. Keep bandage clean and dry. Pain medicine as directed     AMBULATORY SURGERY  DISCHARGE INSTRUCTIONS      1) The drugs that you were given will stay in your system until tomorrow so for the next 24 hours you should not:  A) Drive an automobile B) Make any legal decisions C) Drink any alcoholic beverage   2) You may resume regular meals tomorrow.  Today it is better to start with liquids and gradually work up to solid foods.  You may eat anything you prefer, but it is better to start with liquids, then soup and crackers, and gradually work up to solid foods.   3) Please notify your doctor immediately if you have any unusual bleeding, trouble breathing, redness and pain at the surgery site, drainage, fever, or pain not relieved by medication.    4) Additional Instructions:        Please contact your physician with any problems or Same Day Surgery at [phone], Monday through Friday 6 am to 4 pm, or [place] at [facility] number at [phone].

## 2016-03-26 NOTE — Anesthesia Preprocedure Evaluation (Signed)
Anesthesia Evaluation  Patient identified by MRN, date of birth, ID band Patient awake    Reviewed: Allergy & Precautions, H&P , NPO status , Patient's Chart, lab work & pertinent test results, reviewed documented beta blocker date and time   History of Anesthesia Complications Negative for: history of anesthetic complications  Airway Mallampati: II  TM Distance: >3 FB Neck ROM: full    Dental no notable dental hx. (+) Caps, Chipped, Missing   Pulmonary neg pulmonary ROS,    Pulmonary exam normal breath sounds clear to auscultation       Cardiovascular Exercise Tolerance: Good hypertension, On Medications (-) angina+ CAD and + Cardiac Stents  (-) Past MI and (-) CABG Normal cardiovascular exam(-) dysrhythmias (-) Valvular Problems/Murmurs Rhythm:regular Rate:Normal     Neuro/Psych PSYCHIATRIC DISORDERS (Depression) negative neurological ROS     GI/Hepatic Neg liver ROS, GERD  ,  Endo/Other  diabetes (borderline)  Renal/GU Renal disease (kidney stones)  negative genitourinary   Musculoskeletal   Abdominal   Peds  Hematology negative hematology ROS (+)   Anesthesia Other Findings Past Medical History: No date: Arthritis No date: Coronary artery disease No date: GERD (gastroesophageal reflux disease) No date: History of kidney stones No date: Hypertension No date: Osteoporosis   Reproductive/Obstetrics negative OB ROS                             Anesthesia Physical Anesthesia Plan  ASA: II  Anesthesia Plan: General   Post-op Pain Management:    Induction:   Airway Management Planned:   Additional Equipment:   Intra-op Plan:   Post-operative Plan:   Informed Consent: I have reviewed the patients History and Physical, chart, labs and discussed the procedure including the risks, benefits and alternatives for the proposed anesthesia with the patient or authorized  representative who has indicated his/her understanding and acceptance.   Dental Advisory Given  Plan Discussed with: Anesthesiologist, CRNA and Surgeon  Anesthesia Plan Comments:         Anesthesia Quick Evaluation

## 2016-03-26 NOTE — OR Nursing (Signed)
Pt instructed to restart plavix tomorrow per Dr Rudene Christians.

## 2016-03-27 ENCOUNTER — Encounter: Payer: Self-pay | Admitting: Orthopedic Surgery

## 2016-03-31 NOTE — Anesthesia Postprocedure Evaluation (Signed)
Anesthesia Post Note  Patient: Judy Prince  Procedure(s) Performed: Procedure(s) (LRB): ARTHRODESIS FINGER (Left)  Patient location during evaluation: PACU Anesthesia Type: General Level of consciousness: awake and alert Pain management: pain level controlled Vital Signs Assessment: post-procedure vital signs reviewed and stable Respiratory status: spontaneous breathing, nonlabored ventilation, respiratory function stable and patient connected to nasal cannula oxygen Cardiovascular status: blood pressure returned to baseline and stable Postop Assessment: no signs of nausea or vomiting Anesthetic complications: no     Last Vitals:  Vitals:   03/26/16 1135 03/26/16 1205  BP: (!) 142/64 136/69  Pulse: 65   Resp:    Temp:      Last Pain:  Vitals:   03/27/16 0810  TempSrc:   PainSc: 0-No pain                 Martha Clan

## 2016-07-15 DIAGNOSIS — E538 Deficiency of other specified B group vitamins: Secondary | ICD-10-CM | POA: Insufficient documentation

## 2017-01-28 ENCOUNTER — Other Ambulatory Visit: Payer: Self-pay | Admitting: Internal Medicine

## 2017-01-28 DIAGNOSIS — Z1231 Encounter for screening mammogram for malignant neoplasm of breast: Secondary | ICD-10-CM

## 2017-02-11 ENCOUNTER — Ambulatory Visit
Admission: RE | Admit: 2017-02-11 | Discharge: 2017-02-11 | Disposition: A | Discharge status: Home / Self Care | Payer: BLUE CROSS/BLUE SHIELD | Source: Ambulatory Visit | Attending: Internal Medicine | Admitting: Internal Medicine

## 2017-02-11 DIAGNOSIS — Z1231 Encounter for screening mammogram for malignant neoplasm of breast: Secondary | ICD-10-CM | POA: Insufficient documentation

## 2017-02-11 DIAGNOSIS — R928 Other abnormal and inconclusive findings on diagnostic imaging of breast: Secondary | ICD-10-CM | POA: Diagnosis not present

## 2017-02-15 ENCOUNTER — Other Ambulatory Visit: Payer: Self-pay | Admitting: Internal Medicine

## 2017-02-15 DIAGNOSIS — N6489 Other specified disorders of breast: Secondary | ICD-10-CM

## 2017-02-15 DIAGNOSIS — R928 Other abnormal and inconclusive findings on diagnostic imaging of breast: Secondary | ICD-10-CM

## 2017-03-04 ENCOUNTER — Ambulatory Visit
Admission: RE | Admit: 2017-03-04 | Discharge: 2017-03-04 | Disposition: A | Discharge status: Home / Self Care | Payer: BLUE CROSS/BLUE SHIELD | Source: Ambulatory Visit | Attending: Internal Medicine | Admitting: Internal Medicine

## 2017-03-04 DIAGNOSIS — R928 Other abnormal and inconclusive findings on diagnostic imaging of breast: Secondary | ICD-10-CM | POA: Diagnosis present

## 2017-03-04 DIAGNOSIS — N6489 Other specified disorders of breast: Secondary | ICD-10-CM | POA: Insufficient documentation

## 2017-03-10 ENCOUNTER — Other Ambulatory Visit: Payer: Self-pay | Admitting: Internal Medicine

## 2017-03-10 DIAGNOSIS — N6310 Unspecified lump in the right breast, unspecified quadrant: Secondary | ICD-10-CM

## 2017-03-10 DIAGNOSIS — R928 Other abnormal and inconclusive findings on diagnostic imaging of breast: Secondary | ICD-10-CM

## 2017-03-15 ENCOUNTER — Ambulatory Visit
Admission: RE | Admit: 2017-03-15 | Discharge: 2017-03-15 | Disposition: A | Discharge status: Home / Self Care | Payer: BLUE CROSS/BLUE SHIELD | Source: Ambulatory Visit | Attending: Internal Medicine | Admitting: Internal Medicine

## 2017-03-15 DIAGNOSIS — N6310 Unspecified lump in the right breast, unspecified quadrant: Secondary | ICD-10-CM

## 2017-03-15 DIAGNOSIS — R928 Other abnormal and inconclusive findings on diagnostic imaging of breast: Secondary | ICD-10-CM

## 2017-03-15 DIAGNOSIS — N6091 Unspecified benign mammary dysplasia of right breast: Secondary | ICD-10-CM | POA: Insufficient documentation

## 2017-03-15 HISTORY — PX: BREAST BIOPSY: SHX20

## 2017-03-16 LAB — SURGICAL PATHOLOGY

## 2017-09-20 ENCOUNTER — Other Ambulatory Visit: Payer: Self-pay | Admitting: Internal Medicine

## 2017-09-20 DIAGNOSIS — N6310 Unspecified lump in the right breast, unspecified quadrant: Secondary | ICD-10-CM

## 2017-09-27 ENCOUNTER — Ambulatory Visit
Admission: RE | Admit: 2017-09-27 | Discharge: 2017-09-27 | Disposition: A | Discharge status: Home / Self Care | Payer: BLUE CROSS/BLUE SHIELD | Source: Ambulatory Visit | Attending: Internal Medicine | Admitting: Internal Medicine

## 2017-09-27 DIAGNOSIS — N6310 Unspecified lump in the right breast, unspecified quadrant: Secondary | ICD-10-CM | POA: Diagnosis not present

## 2017-10-04 ENCOUNTER — Ambulatory Visit: Admit: 2017-10-04 | Payer: BLUE CROSS/BLUE SHIELD | Admitting: Unknown Physician Specialty

## 2017-10-04 SURGERY — ESOPHAGOGASTRODUODENOSCOPY (EGD) WITH PROPOFOL
Anesthesia: General

## 2018-04-13 ENCOUNTER — Other Ambulatory Visit: Payer: Self-pay | Admitting: Internal Medicine

## 2018-04-13 DIAGNOSIS — R922 Inconclusive mammogram: Secondary | ICD-10-CM

## 2018-05-13 ENCOUNTER — Ambulatory Visit
Admission: RE | Admit: 2018-05-13 | Discharge: 2018-05-13 | Disposition: A | Discharge status: Home / Self Care | Payer: Medicare HMO | Source: Ambulatory Visit | Attending: Internal Medicine | Admitting: Internal Medicine

## 2018-05-13 DIAGNOSIS — R922 Inconclusive mammogram: Secondary | ICD-10-CM | POA: Insufficient documentation

## 2018-07-22 ENCOUNTER — Ambulatory Visit: Admit: 2018-07-22 | Payer: Medicare HMO | Admitting: Unknown Physician Specialty

## 2018-07-22 SURGERY — ESOPHAGOGASTRODUODENOSCOPY (EGD) WITH PROPOFOL
Anesthesia: General

## 2018-08-05 ENCOUNTER — Other Ambulatory Visit: Payer: Self-pay | Admitting: Internal Medicine

## 2018-08-05 DIAGNOSIS — M5412 Radiculopathy, cervical region: Secondary | ICD-10-CM

## 2018-08-09 ENCOUNTER — Ambulatory Visit
Admission: RE | Admit: 2018-08-09 | Discharge: 2018-08-09 | Disposition: A | Discharge status: Home / Self Care | Payer: Medicare HMO | Source: Ambulatory Visit | Attending: Internal Medicine | Admitting: Internal Medicine

## 2018-08-09 ENCOUNTER — Other Ambulatory Visit: Payer: Self-pay

## 2018-08-09 DIAGNOSIS — M5412 Radiculopathy, cervical region: Secondary | ICD-10-CM | POA: Insufficient documentation

## 2019-04-13 ENCOUNTER — Other Ambulatory Visit: Payer: Self-pay | Admitting: Internal Medicine

## 2019-04-13 DIAGNOSIS — N6011 Diffuse cystic mastopathy of right breast: Secondary | ICD-10-CM

## 2019-06-01 ENCOUNTER — Ambulatory Visit (INDEPENDENT_AMBULATORY_CARE_PROVIDER_SITE_OTHER): Payer: Medicare HMO | Admitting: Cardiovascular Disease

## 2019-06-01 ENCOUNTER — Other Ambulatory Visit: Payer: Self-pay

## 2019-06-01 ENCOUNTER — Encounter: Payer: Self-pay | Admitting: Cardiovascular Disease

## 2019-06-01 VITALS — BP 150/80 | HR 75 | Ht 62.0 in | Wt 173.0 lb

## 2019-06-01 DIAGNOSIS — E785 Hyperlipidemia, unspecified: Secondary | ICD-10-CM | POA: Diagnosis not present

## 2019-06-01 DIAGNOSIS — I2510 Atherosclerotic heart disease of native coronary artery without angina pectoris: Secondary | ICD-10-CM

## 2019-06-01 DIAGNOSIS — I10 Essential (primary) hypertension: Secondary | ICD-10-CM

## 2019-06-01 DIAGNOSIS — M79604 Pain in right leg: Secondary | ICD-10-CM | POA: Diagnosis not present

## 2019-06-01 DIAGNOSIS — M79605 Pain in left leg: Secondary | ICD-10-CM

## 2019-06-01 MED ORDER — EZETIMIBE 10 MG PO TABS: 10.0000 mg | ORAL_TABLET | Freq: Every day | ORAL | 6 refills | Status: DC

## 2019-06-01 NOTE — Progress Notes (Signed)
Cardiology Office Note   Date:  06/01/2019   ID:  Judy Prince, DOB [birth year], MRN [id number]  PCP:  Judy Aus, MD  Cardiologist:   Judy Sacramento, MD   Chief Complaint  Patient presents with  . New Patient (Initial Visit)    CAD; Meds verbally reviewed with patient.      History of Present Illness: Judy Prince is a 64 y.o. female who presents to establish cardiovascular care.  She was followed in the past by Dr. Saralyn Pilar. she has known history of coronary artery disease.  She had anginal symptoms in 2007.  She underwent cardiac catheterization which showed 70% mid RCA stenosis with moderate tortuosity in the vessel.  Attempted PCI was complicated by guide catheter induced spiral dissection which required placement of 3 overlapped Cypher stents.  I personally reviewed the cath images.  Final appearance was excellent with no residual dissection.  She has done well since then with no recurrent cardiac events and has been on dual antiplatelet therapy with aspirin and clopidogrel.  She has other chronic medical conditions that include essential hypertension and hyperlipidemia.  She does have strong family history of coronary artery disease. She has rare episodes of chest pain which responds to muscle rubs.  She had the symptoms all her life according to her.  No recent shortness of breath or palpitations. She does complain of bilateral leg pain with walking especially on the left side with some numbness.  She is not aware of history of peripheral arterial disease.    Past Medical History:  Diagnosis Date  . Arthritis   . Coronary artery disease   . GERD (gastroesophageal reflux disease)   . History of kidney stones   . Hypertension   . Osteoporosis     Past Surgical History:  Procedure Laterality Date  . ABDOMINAL HYSTERECTOMY    . BREAST BIOPSY Right 03/15/2017   PSEUDO-ANGIOMATOUS STROMAL HYPERPLASIA.   Marland Kitchen CHOLECYSTECTOMY    . COLONOSCOPY    . CORONARY ANGIOPLASTY  WITH STENT PLACEMENT  2007   3 stents  . ESOPHAGOGASTRODUODENOSCOPY    . FINGER ARTHRODESIS Left 03/26/2016   Procedure: ARTHRODESIS FINGER;  Surgeon: Judy Knows, MD;  Location: [facility] ORS;  Service: Orthopedics;  Laterality: Left;  . TUBAL LIGATION       Current Outpatient Medications  Medication Sig Dispense Refill  . acetaminophen (TYLENOL) 500 MG tablet Take 1,000 mg by mouth every 6 (six) hours as needed for moderate pain or headache.    Marland Kitchen aspirin EC 81 MG tablet Take 81 mg by mouth daily.     . Calcium Citrate-Vitamin D (CALCIUM + D PO) Take 1 tablet by mouth daily.    . clopidogrel (PLAVIX) 75 MG tablet Take 75 mg by mouth daily.     . diclofenac Sodium (VOLTAREN) 1 % GEL Apply topically as needed.    . fluticasone (FLONASE) 50 MCG/ACT nasal spray USE 2 SPRAYS IN EACH NOSTRIL DAILY AS NEEDED FOR ALLERGIES    . hydrochlorothiazide (HYDRODIURIL) 25 MG tablet Take 25 mg by mouth daily.     Marland Kitchen loratadine (CLARITIN) 10 MG tablet Take 10 mg by mouth daily at 12 noon.    . Menthol, Topical Analgesic, (BENGAY EX) Apply 1 application topically daily.     . metoprolol (LOPRESSOR) 50 MG tablet Take 50 mg by mouth 2 (two) times daily.     . pantoprazole (PROTONIX) 40 MG tablet Take 40mg s daily may take an additional 40mg s as needed  for indigestion    . rosuvastatin (CRESTOR) 20 MG tablet Take 20 mg by mouth every evening.     . sertraline (ZOLOFT) 50 MG tablet Take 50 mg by mouth daily before lunch.   12  . ezetimibe (ZETIA) 10 MG tablet Take 1 tablet (10 mg total) by mouth daily. 30 tablet 6  . HYDROcodone-acetaminophen (NORCO) 5-325 MG tablet Take 1 tablet by mouth every 6 (six) hours as needed for moderate pain. 30 tablet 0  . temazepam (RESTORIL) 15 MG capsule Take 15 mg by mouth at bedtime.     . Tolnaftate (ABSORBINE JR EX) Apply 1 application topically daily as needed (pain).     No current facility-administered medications for this visit.    Allergies:   Patient has no known  allergies.    Social History:  The patient  reports that she has never smoked. She has never used smokeless tobacco. She reports that she does not drink alcohol or use drugs.   Family History:  The patient's family history includes Breast cancer in her maternal aunt; Diabetes in her brother, mother, and sister; Heart failure in her mother.    ROS:  Please see the history of present illness.   Otherwise, review of systems are positive for none.   All other systems are reviewed and negative.    PHYSICAL EXAM: VS:  BP (!) 150/80 (BP Location: Right Arm, Patient Position: Sitting, Cuff Size: Normal)   Pulse 75   Ht 5\' 2"  (1.575 m)   Wt 173 lb (78.5 kg)   LMP  (LMP Unknown)   SpO2 93%   BMI 31.64 kg/m  , BMI Body mass index is 31.64 kg/m. GEN: Well nourished, well developed, in no acute distress  HEENT: normal  Neck: no JVD, carotid bruits, or masses Cardiac: RRR; no murmurs, rubs, or gallops,no edema  Respiratory:  clear to auscultation bilaterally, normal work of breathing GI: soft, nontender, nondistended, + BS MS: no deformity or atrophy  Skin: warm and dry, no rash Neuro:  Strength and sensation are intact Psych: euthymic mood, full affect   EKG:  EKG is ordered today. The ekg ordered today demonstrates normal sinus rhythm with no significant ST or T wave changes.   Recent Labs: No results found for requested labs within last 8760 hours.    Lipid Panel No results found for: CHOL, TRIG, HDL, CHOLHDL, VLDL, LDLCALC, LDLDIRECT    Wt Readings from Last 3 Encounters:  06/01/19 173 lb (78.5 kg)  03/26/16 165 lb (74.8 kg)  03/20/16 165 lb (74.8 kg)      Other studies Reviewed: Additional studies/ records that were reviewed today include: I reviewed previous cardiac records and cardiac cath images. Review of the above records demonstrates: Summarized above  PAD Screen 06/01/2019  Previous PAD dx? Yes  Previous surgical procedure? No  Pain with walking? Yes  Subsides  with rest? No  Feet/toe relief with dangling? No  Painful, non-healing ulcers? No  Extremities discolored? No      ASSESSMENT AND PLAN:  1.  Coronary artery disease involving native coronary arteries without angina: She is doing well overall with no cardiac events since 2007.  Given placement of 3 overlapping Cypher stents, I recommend lifelong dual antiplatelet therapy.  She is already on aspirin and clopidogrel.  There was no significant disease affecting the left coronary system at that time.  2.  Hyperlipidemia: Most recent lipid profile was 88 on current dose of rosuvastatin.  In addition, her triglyceride was  in the 350 range.  She did not tolerate higher dose of rosuvastatin.  Thus, I elected to add Zetia 10 mg daily.  We could consider adding Vascepa in the future for elevated triglyceride.  3.  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  I requested lower extremity arterial Doppler.  4.  Essential hypertension: Blood pressure is elevated today but normally her blood pressure is controlled.  I elected to make no changes at the present time.    Disposition:   FU with me in 6 months  Signed,  Judy Sacramento, MD  06/01/2019 4:32 PM    [facility]

## 2019-06-01 NOTE — Patient Instructions (Addendum)
Medication Instructions:  Your physician has recommended you make the following change in your medication:   START Zetia 10 mg daily. A Rx has been sent to your pharmacy.  *If you need a refill on your cardiac medications before your next appointment, please call your pharmacy*   Lab Work: None ordered If you have labs (blood work) drawn today and your tests are completely normal, you will receive your results only by: Marland Kitchen MyChart Message (if you have MyChart) OR . A paper copy in the mail If you have any lab test that is abnormal or we need to change your treatment, we will call you to review the results.   Testing/Procedures: Your physician has requested that you have a lower extremity arterial exercise duplex. During this test, exercise and ultrasound are used to evaluate arterial blood flow in the legs. Allow one hour for this exam. There are no restrictions or special instructions.    Follow-Up: At [facility], you and your health needs are our priority.  As part of our continuing mission to provide you with exceptional heart care, we have created designated Provider Care Teams.  These Care Teams include your primary Cardiologist (physician) and Advanced Practice Providers (APPs -  Physician Assistants and Nurse Practitioners) who all work together to provide you with the care you need, when you need it.  We recommend signing up for the patient portal called "MyChart".  Sign up information is provided on this After Visit Summary.  MyChart is used to connect with patients for Virtual Visits (Telemedicine).  Patients are able to view lab/test results, encounter notes, upcoming appointments, etc.  Non-urgent messages can be sent to your provider as well.   To learn more about what you can do with MyChart, go to [email or web address].    Your next appointment:   6 month(s)  The format for your next appointment:   In Person  Provider:    You may see Dr. Fletcher Anon or one of the  following Advanced Practice Providers on your designated Care Team:    Murray Hodgkins, NP  Christell Faith, PA-C  Marrianne Mood, PA-C    Other Instructions N/A

## 2019-06-06 ENCOUNTER — Ambulatory Visit
Admission: RE | Admit: 2019-06-06 | Discharge: 2019-06-06 | Disposition: A | Discharge status: Home / Self Care | Payer: Medicare HMO | Source: Ambulatory Visit | Attending: Internal Medicine | Admitting: Internal Medicine

## 2019-06-06 DIAGNOSIS — N6011 Diffuse cystic mastopathy of right breast: Secondary | ICD-10-CM | POA: Diagnosis present

## 2019-06-30 ENCOUNTER — Other Ambulatory Visit: Payer: Self-pay | Admitting: Cardiovascular Disease

## 2019-06-30 DIAGNOSIS — M79605 Pain in left leg: Secondary | ICD-10-CM

## 2019-06-30 DIAGNOSIS — M79604 Pain in right leg: Secondary | ICD-10-CM

## 2019-07-17 ENCOUNTER — Other Ambulatory Visit: Payer: Self-pay

## 2019-07-17 ENCOUNTER — Ambulatory Visit (INDEPENDENT_AMBULATORY_CARE_PROVIDER_SITE_OTHER): Payer: Medicare HMO

## 2019-07-17 DIAGNOSIS — M79604 Pain in right leg: Secondary | ICD-10-CM

## 2019-07-17 DIAGNOSIS — M79605 Pain in left leg: Secondary | ICD-10-CM

## 2019-07-19 ENCOUNTER — Telehealth: Payer: Self-pay

## 2019-07-19 NOTE — Telephone Encounter (Signed)
Called to give the patient LE study results. [email or web address] 

## 2019-07-19 NOTE — Telephone Encounter (Signed)
-----   Message from Wellington Hampshire, MD sent at 07/19/2019  3:46 PM EDT ----- Inform patient that  ABI was normal with no evidence of PAD.

## 2019-07-21 NOTE — Telephone Encounter (Signed)
Patient made aware of results with verbalized understanding. 

## 2019-08-02 DIAGNOSIS — K2210 Ulcer of esophagus without bleeding: Secondary | ICD-10-CM | POA: Insufficient documentation

## 2019-08-02 DIAGNOSIS — D369 Benign neoplasm, unspecified site: Secondary | ICD-10-CM | POA: Insufficient documentation

## 2019-11-14 ENCOUNTER — Other Ambulatory Visit: Payer: Self-pay | Admitting: Cardiovascular Disease

## 2019-11-14 DIAGNOSIS — E785 Hyperlipidemia, unspecified: Secondary | ICD-10-CM

## 2019-11-20 ENCOUNTER — Other Ambulatory Visit: Payer: Self-pay

## 2019-11-20 ENCOUNTER — Emergency Department: Payer: Medicare HMO

## 2019-11-20 ENCOUNTER — Encounter: Payer: Self-pay | Admitting: Emergency Medicine

## 2019-11-20 DIAGNOSIS — M25552 Pain in left hip: Secondary | ICD-10-CM | POA: Diagnosis present

## 2019-11-20 DIAGNOSIS — Z5321 Procedure and treatment not carried out due to patient leaving prior to being seen by health care provider: Secondary | ICD-10-CM | POA: Insufficient documentation

## 2019-11-20 NOTE — ED Triage Notes (Signed)
Pt presents to ED via POV, states was mowing grass when she fell backwards and landed on a ramp. Pt c/o L hip pain, pt initially noted to be sitting calmly in lobby, durring transport to triage via wheelchair pt became upset, began c/o pain, and became tearful. During triage, pt noted to calm down, no longer tearful. Pt otherwise A&O x4.

## 2019-11-21 ENCOUNTER — Emergency Department
Admission: EM | Admit: 2019-11-21 | Discharge: 2019-11-21 | Disposition: A | Discharge status: Home / Self Care | Payer: Medicare HMO | Attending: Emergency Medicine | Admitting: Emergency Medicine

## 2019-12-07 ENCOUNTER — Encounter: Payer: Self-pay | Admitting: Cardiovascular Disease

## 2019-12-07 ENCOUNTER — Other Ambulatory Visit: Payer: Self-pay

## 2019-12-07 ENCOUNTER — Ambulatory Visit: Payer: Medicare HMO | Admitting: Cardiovascular Disease

## 2019-12-07 VITALS — BP 170/80 | HR 66 | Ht 61.0 in | Wt 168.4 lb

## 2019-12-07 DIAGNOSIS — E785 Hyperlipidemia, unspecified: Secondary | ICD-10-CM | POA: Diagnosis not present

## 2019-12-07 DIAGNOSIS — I2510 Atherosclerotic heart disease of native coronary artery without angina pectoris: Secondary | ICD-10-CM

## 2019-12-07 DIAGNOSIS — I10 Essential (primary) hypertension: Secondary | ICD-10-CM

## 2019-12-07 NOTE — Progress Notes (Signed)
Cardiology Office Note   Date:  12/07/2019   ID:  Judy Prince, DOB 04/07/1955, MRN [id number]  PCP:  Rusty Aus, MD  Cardiologist:   Kathlyn Sacramento, MD   Chief Complaint  Patient presents with  . office visit    6 month F/U; Meds verbally reviewed with patient.      History of Present Illness: Judy Prince is a 64 y.o. female who is here today for follow-up visit regarding coronary artery disease.    She had anginal symptoms in 2007.  She underwent cardiac catheterization which showed 70% mid RCA stenosis with moderate tortuosity in the vessel.  Attempted PCI was complicated by guide catheter induced spiral dissection which required placement of 3 overlapped Cypher stents.   She has done well since then with no recurrent cardiac events and has been on dual antiplatelet therapy with aspirin and clopidogrel.  She has other chronic medical conditions that include essential hypertension and hyperlipidemia.  She does have strong family history of coronary artery disease. Lower extremity arterial Doppler in April of this year showed normal ABI and toe pressure.  It was done for atypical leg pain.  She has known history of lumbar disc disease.  I added Zetia during last visit given an LDL of 88.  Repeat lipid profile showed improvement in her LDL was down to 55.  Triglyceride improved to 281.  She has been doing well with no recent chest pain, shortness of breath or palpitations.  She takes her medications regularly.  Past Medical History:  Diagnosis Date  . Arthritis   . Coronary artery disease   . GERD (gastroesophageal reflux disease)   . History of kidney stones   . Hypertension   . Osteoporosis     Past Surgical History:  Procedure Laterality Date  . ABDOMINAL HYSTERECTOMY    . BREAST BIOPSY Right 03/15/2017   PSEUDO-ANGIOMATOUS STROMAL HYPERPLASIA.   Marland Kitchen CHOLECYSTECTOMY    . COLONOSCOPY    . CORONARY ANGIOPLASTY WITH STENT PLACEMENT  2007   3 stents  .  ESOPHAGOGASTRODUODENOSCOPY    . FINGER ARTHRODESIS Left 03/26/2016   Procedure: ARTHRODESIS FINGER;  Surgeon: Hessie Knows, MD;  Location: [facility] ORS;  Service: Orthopedics;  Laterality: Left;  . TUBAL LIGATION       Current Outpatient Medications  Medication Sig Dispense Refill  . acetaminophen (TYLENOL) 500 MG tablet Take 1,000 mg by mouth every 6 (six) hours as needed for moderate pain or headache.    Marland Kitchen aspirin EC 81 MG tablet Take 81 mg by mouth daily.     . Calcium Citrate-Vitamin D (CALCIUM + D PO) Take 1 tablet by mouth daily.    . clopidogrel (PLAVIX) 75 MG tablet Take 75 mg by mouth daily.     . diclofenac Sodium (VOLTAREN) 1 % GEL Apply topically as needed.    . ezetimibe (ZETIA) 10 MG tablet Take 1 tablet (10 mg total) by mouth daily. 30 tablet 6  . fluticasone (FLONASE) 50 MCG/ACT nasal spray USE 2 SPRAYS IN EACH NOSTRIL DAILY AS NEEDED FOR ALLERGIES    . hydrochlorothiazide (HYDRODIURIL) 25 MG tablet Take 25 mg by mouth daily.     Marland Kitchen loratadine (CLARITIN) 10 MG tablet Take 10 mg by mouth daily at 12 noon.    . Menthol, Topical Analgesic, (BENGAY EX) Apply 1 application topically daily.     . metoprolol (LOPRESSOR) 50 MG tablet Take 50 mg by mouth 2 (two) times daily.     Marland Kitchen  pantoprazole (PROTONIX) 40 MG tablet Take 40mg s daily may take an additional 40mg s as needed for indigestion    . rosuvastatin (CRESTOR) 20 MG tablet Take 20 mg by mouth every evening.     . sertraline (ZOLOFT) 50 MG tablet Take 50 mg by mouth daily before lunch.   12  . Tolnaftate (ABSORBINE JR EX) Apply 1 application topically daily as needed (pain).    Marland Kitchen HYDROcodone-acetaminophen (NORCO) 5-325 MG tablet Take 1 tablet by mouth every 6 (six) hours as needed for moderate pain. 30 tablet 0  . temazepam (RESTORIL) 15 MG capsule Take 15 mg by mouth at bedtime.      No current facility-administered medications for this visit.    Allergies:   Patient has no known allergies.    Social History:  The patient   reports that she has never smoked. She has never used smokeless tobacco. She reports that she does not drink alcohol and does not use drugs.   Family History:  The patient's family history includes Breast cancer in her maternal aunt; Diabetes in her brother, mother, and sister; Heart failure in her mother.    ROS:  Please see the history of present illness.   Otherwise, review of systems are positive for none.   All other systems are reviewed and negative.    PHYSICAL EXAM: VS:  BP (!) 170/80 (BP Location: Left Arm, Patient Position: Sitting, Cuff Size: Normal)   Pulse 66   Ht 5\' 1"  (1.549 m)   Wt 168 lb 6 oz (76.4 kg)   LMP  (LMP Unknown)   SpO2 96%   BMI 31.81 kg/m  , BMI Body mass index is 31.81 kg/m. GEN: Well nourished, well developed, in no acute distress  HEENT: normal  Neck: no JVD, carotid bruits, or masses Cardiac: RRR; no murmurs, rubs, or gallops,no edema  Respiratory:  clear to auscultation bilaterally, normal work of breathing GI: soft, nontender, nondistended, + BS MS: no deformity or atrophy  Skin: warm and dry, no rash Neuro:  Strength and sensation are intact Psych: euthymic mood, full affect   EKG:  EKG is ordered today. The ekg ordered today demonstrates normal sinus rhythm with no significant ST or T wave changes.   Recent Labs: No results found for requested labs within last 8760 hours.    Lipid Panel No results found for: CHOL, TRIG, HDL, CHOLHDL, VLDL, LDLCALC, LDLDIRECT    Wt Readings from Last 3 Encounters:  12/07/19 168 lb 6 oz (76.4 kg)  11/20/19 165 lb (74.8 kg)  06/01/19 173 lb (78.5 kg)        PAD Screen 06/01/2019  Previous PAD dx? Yes  Previous surgical procedure? No  Pain with walking? Yes  Subsides with rest? No  Feet/toe relief with dangling? No  Painful, non-healing ulcers? No  Extremities discolored? No      ASSESSMENT AND PLAN:  1.  Coronary artery disease involving native coronary arteries without angina: She is  doing well overall with no cardiac events since 2007.  Given placement of 3 overlapping Cypher stents, I recommend lifelong dual antiplatelet therapy.  She is already on aspirin and clopidogrel.  There was no significant disease affecting the left coronary system at that time.  2.  Hyperlipidemia: Lipid profile improved significantly with the addition of Zetia to rosuvastatin with an LDL of 55.  Even her triglyceride improved but still slightly above 250.  I discussed with her heart healthy diet.  If triglyceride remains above 150, we can  consider adding Vascepa.    3. Essential hypertension: Blood pressure was elevated initially but I checked it manually and it was 140/78.  I reviewed her blood pressure readings during recent office visits and they were all in the normal range.    Disposition:   FU with me in 6 months  Signed,  Kathlyn Sacramento, MD  12/07/2019 11:51 AM    [place]

## 2019-12-07 NOTE — Patient Instructions (Signed)
Medication Instructions:  Your physician recommends that you continue on your current medications as directed. Please refer to the Current Medication list given to you today.  *If you need a refill on your cardiac medications before your next appointment, please call your pharmacy*   Lab Work: None ordered If you have labs (blood work) drawn today and your tests are completely normal, you will receive your results only by: Marland Kitchen MyChart Message (if you have MyChart) OR . A paper copy in the mail If you have any lab test that is abnormal or we need to change your treatment, we will call you to review the results.   Testing/Procedures: None ordered   Follow-Up: At [facility], you and your health needs are our priority.  As part of our continuing mission to provide you with exceptional heart care, we have created designated Provider Care Teams.  These Care Teams include your primary Cardiologist (physician) and Advanced Practice Providers (APPs -  Physician Assistants and Nurse Practitioners) who all work together to provide you with the care you need, when you need it.  We recommend signing up for the patient portal called "MyChart".  Sign up information is provided on this After Visit Summary.  MyChart is used to connect with patients for Virtual Visits (Telemedicine).  Patients are able to view lab/test results, encounter notes, upcoming appointments, etc.  Non-urgent messages can be sent to your provider as well.   To learn more about what you can do with MyChart, go to [email or web address].    Your next appointment:   6 month(s)  The format for your next appointment:   In Person  Provider:    You may see  Dr. Fletcher Anon or one of the following Advanced Practice Providers on your designated Care Team:    Murray Hodgkins, NP  Christell Faith, PA-C  Marrianne Mood, PA-C    Other Instructions  Heart-Healthy Eating Plan Heart-healthy meal planning includes:  Eating less  unhealthy fats.  Eating more healthy fats.  Making other changes in your diet. Talk with your doctor or a diet specialist (dietitian) to create an eating plan that is right for you.  What are tips for following this plan? Cooking Avoid frying your food. Try to bake, boil, grill, or broil it instead. You can also reduce fat by:  Removing the skin from poultry.  Removing all visible fats from meats.  Steaming vegetables in water or broth. Meal planning   At meals, divide your plate into four equal parts: ? Fill one-half of your plate with vegetables and green salads. ? Fill one-fourth of your plate with whole grains. ? Fill one-fourth of your plate with lean protein foods.  Eat 4-5 servings of vegetables per day. A serving of vegetables is: ? 1 cup of raw or cooked vegetables. ? 2 cups of raw leafy greens.  Eat 4-5 servings of fruit per day. A serving of fruit is: ? 1 medium whole fruit. ?  cup of dried fruit. ?  cup of fresh, frozen, or canned fruit. ?  cup of 100% fruit juice.  Eat more foods that have soluble fiber. These are apples, broccoli, carrots, beans, peas, and barley. Try to get 20-30 g of fiber per day.  Eat 4-5 servings of nuts, legumes, and seeds per week: ? 1 serving of dried beans or legumes equals  cup after being cooked. ? 1 serving of nuts is  cup. ? 1 serving of seeds equals 1 tablespoon. General information  Eat more  home-cooked food. Eat less restaurant, buffet, and fast food.  Limit or avoid alcohol.  Limit foods that are high in starch and sugar.  Avoid fried foods.  Lose weight if you are overweight.  Keep track of how much salt (sodium) you eat. This is important if you have high blood pressure. Ask your doctor to tell you more about this.  Try to add vegetarian meals each week. Fats  Choose healthy fats. These include olive oil and canola oil, flaxseeds, walnuts, almonds, and seeds.  Eat more omega-3 fats. These include salmon,  mackerel, sardines, tuna, flaxseed oil, and ground flaxseeds. Try to eat fish at least 2 times each week.  Check food labels. Avoid foods with trans fats or high amounts of saturated fat.  Limit saturated fats. ? These are often found in animal products, such as meats, butter, and cream. ? These are also found in plant foods, such as palm oil, palm kernel oil, and coconut oil.  Avoid foods with partially hydrogenated oils in them. These have trans fats. Examples are stick margarine, some tub margarines, cookies, crackers, and other baked goods. What foods can I eat? Fruits All fresh, canned (in natural juice), or frozen fruits. Vegetables Fresh or frozen vegetables (raw, steamed, roasted, or grilled). Green salads. Grains Most grains. Choose whole wheat and whole grains most of the time. Rice and pasta, including brown rice and pastas made with whole wheat. Meats and other proteins Lean, well-trimmed beef, veal, pork, and lamb. Chicken and [place] without skin. All fish and shellfish. Wild duck, rabbit, pheasant, and venison. Egg whites or low-cholesterol egg substitutes. Dried beans, peas, lentils, and tofu. Seeds and most nuts. Dairy Low-fat or nonfat cheeses, including ricotta and mozzarella. Skim or 1% milk that is liquid, powdered, or evaporated. Buttermilk that is made with low-fat milk. Nonfat or low-fat yogurt. Fats and oils Non-hydrogenated (trans-free) margarines. Vegetable oils, including soybean, sesame, sunflower, olive, peanut, safflower, corn, canola, and cottonseed. Salad dressings or mayonnaise made with a vegetable oil. Beverages Mineral water. Coffee and tea. Diet carbonated beverages. Sweets and desserts Sherbet, gelatin, and fruit ice. Small amounts of dark chocolate. Limit all sweets and desserts. Seasonings and condiments All seasonings and condiments. The items listed above may not be a complete list of foods and drinks you can eat. Contact a dietitian for more  options. What foods should I avoid? Fruits Canned fruit in heavy syrup. Fruit in cream or butter sauce. Fried fruit. Limit coconut. Vegetables Vegetables cooked in cheese, cream, or butter sauce. Fried vegetables. Grains Breads that are made with saturated or trans fats, oils, or whole milk. Croissants. Sweet rolls. Donuts. High-fat crackers, such as cheese crackers. Meats and other proteins Fatty meats, such as hot dogs, ribs, sausage, bacon, rib-eye roast or steak. High-fat deli meats, such as salami and bologna. Caviar. Domestic duck and goose. Organ meats, such as liver. Dairy Cream, sour cream, cream cheese, and creamed cottage cheese. Whole-milk cheeses. Whole or 2% milk that is liquid, evaporated, or condensed. Whole buttermilk. Cream sauce or high-fat cheese sauce. Yogurt that is made from whole milk. Fats and oils Meat fat, or shortening. Cocoa butter, hydrogenated oils, palm oil, coconut oil, palm kernel oil. Solid fats and shortenings, including bacon fat, salt pork, lard, and butter. Nondairy cream substitutes. Salad dressings with cheese or sour cream. Beverages Regular sodas and juice drinks with added sugar. Sweets and desserts Frosting. Pudding. Cookies. Cakes. Pies. Milk chocolate or white chocolate. Buttered syrups. Full-fat ice cream or ice cream  drinks. The items listed above may not be a complete list of foods and drinks to avoid. Contact a dietitian for more information. Summary  Heart-healthy meal planning includes eating less unhealthy fats, eating more healthy fats, and making other changes in your diet.  Eat a balanced diet. This includes fruits and vegetables, low-fat or nonfat dairy, lean protein, nuts and legumes, whole grains, and heart-healthy oils and fats. This information is not intended to replace advice given to you by your health care provider. Make sure you discuss any questions you have with your health care provider. Document Revised: 05/20/2017  Document Reviewed: 04/23/2017 Elsevier Patient Education  2020 [employer].

## 2019-12-20 ENCOUNTER — Other Ambulatory Visit: Payer: Self-pay | Admitting: Cardiovascular Disease

## 2019-12-20 DIAGNOSIS — E785 Hyperlipidemia, unspecified: Secondary | ICD-10-CM

## 2020-01-02 ENCOUNTER — Other Ambulatory Visit: Payer: Self-pay | Admitting: Student

## 2020-01-02 DIAGNOSIS — G8929 Other chronic pain: Secondary | ICD-10-CM

## 2020-01-02 DIAGNOSIS — M1712 Unilateral primary osteoarthritis, left knee: Secondary | ICD-10-CM

## 2020-01-04 ENCOUNTER — Ambulatory Visit
Admission: RE | Admit: 2020-01-04 | Discharge: 2020-01-04 | Disposition: A | Discharge status: Home / Self Care | Payer: Medicare HMO | Source: Ambulatory Visit | Attending: Student | Admitting: Student

## 2020-01-04 ENCOUNTER — Other Ambulatory Visit: Payer: Self-pay

## 2020-01-04 DIAGNOSIS — M25562 Pain in left knee: Secondary | ICD-10-CM | POA: Insufficient documentation

## 2020-01-04 DIAGNOSIS — G8929 Other chronic pain: Secondary | ICD-10-CM | POA: Insufficient documentation

## 2020-01-04 DIAGNOSIS — M1712 Unilateral primary osteoarthritis, left knee: Secondary | ICD-10-CM | POA: Insufficient documentation

## 2020-04-24 ENCOUNTER — Ambulatory Visit: Payer: Medicare HMO | Admitting: Dermatology

## 2020-04-24 ENCOUNTER — Other Ambulatory Visit: Payer: Self-pay

## 2020-04-24 ENCOUNTER — Encounter: Payer: Self-pay | Admitting: Dermatology

## 2020-04-24 DIAGNOSIS — D234 Other benign neoplasm of skin of scalp and neck: Secondary | ICD-10-CM

## 2020-04-24 DIAGNOSIS — D239 Other benign neoplasm of skin, unspecified: Secondary | ICD-10-CM

## 2020-04-24 DIAGNOSIS — L570 Actinic keratosis: Secondary | ICD-10-CM

## 2020-04-24 DIAGNOSIS — L578 Other skin changes due to chronic exposure to nonionizing radiation: Secondary | ICD-10-CM

## 2020-04-24 DIAGNOSIS — D485 Neoplasm of uncertain behavior of skin: Secondary | ICD-10-CM | POA: Diagnosis not present

## 2020-04-24 DIAGNOSIS — B078 Other viral warts: Secondary | ICD-10-CM

## 2020-04-24 DIAGNOSIS — L814 Other melanin hyperpigmentation: Secondary | ICD-10-CM

## 2020-04-24 DIAGNOSIS — L821 Other seborrheic keratosis: Secondary | ICD-10-CM

## 2020-04-24 DIAGNOSIS — Z85828 Personal history of other malignant neoplasm of skin: Secondary | ICD-10-CM

## 2020-04-24 DIAGNOSIS — L739 Follicular disorder, unspecified: Secondary | ICD-10-CM | POA: Diagnosis not present

## 2020-04-24 DIAGNOSIS — L719 Rosacea, unspecified: Secondary | ICD-10-CM | POA: Diagnosis not present

## 2020-04-24 DIAGNOSIS — L820 Inflamed seborrheic keratosis: Secondary | ICD-10-CM | POA: Diagnosis not present

## 2020-04-24 DIAGNOSIS — Z1283 Encounter for screening for malignant neoplasm of skin: Secondary | ICD-10-CM

## 2020-04-24 DIAGNOSIS — Z872 Personal history of diseases of the skin and subcutaneous tissue: Secondary | ICD-10-CM

## 2020-04-24 DIAGNOSIS — D229 Melanocytic nevi, unspecified: Secondary | ICD-10-CM

## 2020-04-24 DIAGNOSIS — D1800 Hemangioma unspecified site: Secondary | ICD-10-CM

## 2020-04-24 NOTE — Progress Notes (Signed)
New Patient Visit  Subjective  Judy Prince is a 65 y.o. female who presents for the following: FBSE (Patient here for full body skin exam and skin cancer screening. She has irritated spots at back of left knee, right ankle and chest that she would like looked at. There is also a spot at right buttock that was diagnosed as folliculitis by Dr. Evorn Gong. She did have a topical solution, possibly clindamycin prescribed to her but unsure of the name.).  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    She also feels like her hair is thinning at the top. Patient did have Covid at Christmas.  The following portions of the chart were reviewed this encounter and updated as appropriate:   Tobacco  Allergies  Meds  Problems  Med Hx  Surg Hx  Fam Hx      Review of Systems:  No other skin or systemic complaints except as noted in HPI or Assessment and Plan.  Objective  Well appearing patient in no apparent distress; mood and affect are within normal limits.  A full examination was performed including scalp, head, eyes, ears, nose, lips, neck, chest, axillae, abdomen, back, buttocks, bilateral upper extremities, bilateral lower extremities, hands, feet, fingers, toes, fingernails, and toenails. All findings within normal limits unless otherwise noted below.  Objective  face: Mild erythema  Objective  left nose x 1: Erythematous thin papules/macules with gritty scale.   Objective  chest x 2 (2): Erythematous keratotic or waxy stuck-on papule or plaque.   Objective  right cheek: Scaly pink thin papule  Objective  buttocks: Perifollicular erythematous papules and pustules   Objective  Occipital scalp: Firm scaly pink papule   Objective  Left Popliteal x 1, right buttock: Verrucous papules   Assessment & Plan  Rosacea face  Patient does complain of irritation/grittiness at the eyes.    Patient defers treatment with low dose doxycycline.    AK (actinic keratosis) left nose x 1  Prior to procedure, discussed risks of blister formation, small wound, skin dyspigmentation, or rare scar following cryotherapy.    Destruction of lesion - left nose x 1 Complexity: simple   Destruction method: cryotherapy   Informed consent: discussed and consent obtained   Lesion destroyed using liquid nitrogen: Yes   Cryotherapy cycles:  2 Outcome: patient tolerated procedure well with no complications   Post-procedure details: wound care instructions given    Inflamed seborrheic keratosis (2) chest x 2  Prior to procedure, discussed risks of blister formation, small wound, skin dyspigmentation, or rare scar following cryotherapy.    Destruction of lesion - chest x 2 Complexity: simple   Destruction method: cryotherapy   Informed consent: discussed and consent obtained   Lesion destroyed using liquid nitrogen: Yes   Cryotherapy cycles:  2 Outcome: patient tolerated procedure well with no complications   Post-procedure details: wound care instructions given    Neoplasm of uncertain behavior of skin right cheek  Excoriation vs AK  Recommend Vaseline, avoid scratching, and will recheck on follow up.   Folliculitis buttocks  Benign-appearing.  Observation.  Call clinic for new or changing lesions.  Recommend daily use of broad spectrum spf 30+ sunscreen to sun-exposed areas.   Continue clindamycin as prescribed. Patient has.   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  Recheck on follow up, consider biopsy if  not resolved  Destruction of lesion - Occipital scalp  Destruction method: cryotherapy   Informed consent: discussed and consent obtained   Lesion destroyed using liquid nitrogen: Yes   Cryotherapy cycles:  2 Outcome: patient tolerated procedure well with no complications    Post-procedure details: wound care instructions given    Other viral warts Left Popliteal x 1, right buttock  Discussed viral etiology and risk of spread.  Discussed multiple treatments may be required to clear warts.  Discussed possible post-treatment dyspigmentation and risk of recurrence.  Prior to procedure, discussed risks of blister formation, small wound, skin dyspigmentation, or rare scar following cryotherapy.   Vs verruca at right buttock. Pt defers treatment today.   Destruction of lesion - Left Popliteal x 1, right buttock Complexity: simple   Destruction method: cryotherapy   Informed consent: discussed and consent obtained   Lesion destroyed using liquid nitrogen: Yes   Cryotherapy cycles:  2 Outcome: patient tolerated procedure well with no complications   Post-procedure details: wound care instructions given     Lentigines - Scattered tan macules - Discussed due to sun exposure - Benign, observe - Call for any changes  Seborrheic Keratoses - Stuck-on, waxy, tan-brown papules and plaques - Discussed benign etiology and prognosis. - Observe - Call for any changes  Melanocytic Nevi - Tan-brown and/or pink-flesh-colored symmetric macules and papules - Benign appearing on exam today - Observation - Call clinic for new or changing moles - Recommend daily use of broad spectrum spf 30+ sunscreen to sun-exposed areas.   Hemangiomas - Red papules - Discussed benign nature - Observe - Call for any changes  Actinic Damage - Chronic, secondary to cumulative UV/sun exposure - diffuse scaly erythematous macules with underlying dyspigmentation - Recommend daily broad spectrum sunscreen SPF 30+ to sun-exposed areas, reapply every 2 hours as needed.  - Call for new or changing lesions.  Skin cancer screening performed today.  History of Basal Cell Carcinoma of the Skin - No evidence of recurrence today at right forearm, approximately 2 years ago - Recommend  regular full body skin exams - Recommend daily broad spectrum sunscreen SPF 30+ to sun-exposed areas, reapply every 2 hours as needed.  - Call if any new or changing lesions are noted between office visits  History of PreCancerous Actinic Keratosis  - site(s) of PreCancerous Actinic Keratosis clear today. - these may recur and new lesions may form requiring treatment to prevent transformation into skin cancer - observe for new or changing spots and contact [facility] for appointment if occur - photoprotection with sun protective clothing; sunglasses and broad spectrum sunscreen with SPF of at least 30 + and frequent self skin exams recommended - yearly exams by a dermatologist recommended for persons with history of PreCancerous Actinic Keratoses  Return in about 1 month (around 05/25/2020) for AK follow up, hair loss.  Graciella Belton, RMA, am acting as scribe for Forest Gleason, MD .  Documentation: I have reviewed the above documentation for accuracy and completeness, and I agree with the above.  Forest Gleason, MD

## 2020-04-24 NOTE — Patient Instructions (Addendum)
Melanoma ABCDEs  Melanoma is the most dangerous type of skin cancer, and is the leading cause of death from skin disease.  You are more likely to develop melanoma if you:  Have light-colored skin, light-colored eyes, or red or blond hair  Spend a lot of time in the sun  Tan regularly, either outdoors or in a tanning bed  Have had blistering sunburns, especially during childhood  Have a close family member who has had a melanoma  Have atypical moles or large birthmarks  Early detection of melanoma is key since treatment is typically straightforward and cure rates are extremely high if we catch it early.   The first sign of melanoma is often a change in a mole or a new dark spot.  The ABCDE system is a way of remembering the signs of melanoma.  A for asymmetry:  The two halves do not match. B for border:  The edges of the growth are irregular. C for color:  A mixture of colors are present instead of an even brown color. D for diameter:  Melanomas are usually (but not always) greater than 29mm - the size of a pencil eraser. E for evolution:  The spot keeps changing in size, shape, and color.  Please check your skin once per month between visits. You can use a small mirror in front and a large mirror behind you to keep an eye on the back side or your body.   If you see any new or changing lesions before your next follow-up, please call to schedule a visit.  Please continue daily skin protection including broad spectrum sunscreen SPF 30+ to sun-exposed areas, reapplying every 2 hours as needed when you're outdoors.    Recommend taking Heliocare sun protection supplement daily in sunny weather for additional sun protection. For maximum protection on the sunniest days, you can take up to 2 capsules of regular Heliocare OR take 1 capsule of Heliocare Ultra. For prolonged exposure (such as a full day in the sun), you can repeat your dose of the supplement 4 hours after your first dose.  Heliocare can be purchased at [facility] or at [email or web address].    Cryotherapy Aftercare  . Wash gently with soap and water everyday.   Marland Kitchen Apply Vaseline and Band-Aid daily until healed.  Prior to procedure, discussed risks of blister formation, small wound, skin dyspigmentation, or rare scar following cryotherapy.    Rosacea  What is rosacea? Rosacea (say: ro-zay-sha) is a common skin disease that usually begins as a trend of flushing or blushing easily.  As rosacea progresses, a persistent redness in the center of the face will develop and may gradually spread beyond the nose and cheeks to the forehead and chin.  In some cases, the ears, chest, and back could be affected.  Rosacea may appear as tiny blood vessels or small red bumps that occur in crops.  Frequently they can contain pus, and are called "pustules".  If the bumps do not contain pus, they are referred to as "papules".  Rarely, in prolonged, untreated cases of rosacea, the oil glands of the nose and cheeks may become permanently enlarged.  This is called rhinophyma, and is seen more frequently in men.  Signs and Risks In its beginning stages, rosacea tends to come and go, which makes it difficult to recognize.  It can start as intermittent flushing of the face.  Eventually, blood vessels may become permanently visible.  Pustules and papules can appear, but can be  mistaken for adult acne.  People of all races, ages, genders and ethnic groups are at risk of developing rosacea.  However, it is more common in women (especially around menopause) and adults with fair skin between the ages of 53 and 5.  Treatment Dermatologists typically recommend a combination of treatments to effectively manage rosacea.  Treatment can improve symptoms and may stop the progression of the rosacea.  Treatment may involve both topical and oral medications.  The tetracycline antibiotics are often used for their anti-inflammatory effect; however,  because of the possibility of developing antibiotic resistance, they should not be used long term at full dose.  For dilated blood vessels the options include electrodessication (uses electric current through a small needle), laser treatment, and cosmetics to hide the redness.   With all forms of treatment, improvement is a slow process, and patients may not see any results for the first 3-4 weeks.  It is very important to avoid the sun and other triggers.  Patients must wear sunscreen daily.  Skin Care Instructions: 1. Cleanse the skin with a mild soap such as CeraVe cleanser, Cetaphil cleanser, or Dove soap once or twice daily as needed. 2. Moisturize with Eucerin Redness Relief Daily Perfecting Lotion (has a subtle green tint), CeraVe Moisturizing Cream, or Oil of Olay Daily Moisturizer with sunscreen every morning and/or night as recommended. 3. Makeup should be "non-comedogenic" (won't clog pores) and be labeled "for sensitive skin". Good choices for cosmetics are: Neutrogena, Almay, and Physician's Formula.  Any product with a green tint tends to offset a red complexion. 4. If your eyes are dry and irritated, use artificial tears 2-3 times per day and cleanse the eyelids daily with baby shampoo.  Have your eyes examined at least every 2 years.  Be sure to tell your eye doctor that you have rosacea. 5. Alcoholic beverages tend to cause flushing of the skin, and may make rosacea worse. 6. Always wear sunscreen, protect your skin from extreme hot and cold temperatures, and avoid spicy foods, hot drinks, and mechanical irritation such as rubbing, scrubbing, or massaging the face.  Avoid harsh skin cleansers, cleansing masks, astringents, and exfoliation. If a particular product burns or makes your face feel tight, then it is likely to flare your rosacea. 7. If you are having difficulty finding a sunscreen that you can tolerate, you may try switching to a chemical-free sunscreen.  These are ones whose  active ingredient is zinc oxide or titanium dioxide only.  They should also be fragrance free, non-comedogenic, and labeled for sensitive skin. 8. Rosacea triggers may vary from person to person.  There are a variety of foods that have been reported to trigger rosacea.  Some patients find that keeping a diary of what they were doing when they flared helps them avoid triggers.

## 2020-05-30 ENCOUNTER — Ambulatory Visit: Payer: Medicare HMO | Admitting: Dermatology

## 2020-05-30 ENCOUNTER — Other Ambulatory Visit: Payer: Self-pay

## 2020-05-30 DIAGNOSIS — L281 Prurigo nodularis: Secondary | ICD-10-CM

## 2020-05-30 DIAGNOSIS — D2339 Other benign neoplasm of skin of other parts of face: Secondary | ICD-10-CM

## 2020-05-30 DIAGNOSIS — L659 Nonscarring hair loss, unspecified: Secondary | ICD-10-CM | POA: Diagnosis not present

## 2020-05-30 DIAGNOSIS — L719 Rosacea, unspecified: Secondary | ICD-10-CM

## 2020-05-30 DIAGNOSIS — L570 Actinic keratosis: Secondary | ICD-10-CM | POA: Diagnosis not present

## 2020-05-30 DIAGNOSIS — L739 Follicular disorder, unspecified: Secondary | ICD-10-CM | POA: Diagnosis not present

## 2020-05-30 DIAGNOSIS — T148XXA Other injury of unspecified body region, initial encounter: Secondary | ICD-10-CM | POA: Diagnosis not present

## 2020-05-30 DIAGNOSIS — D2330 Other benign neoplasm of skin of unspecified part of face: Secondary | ICD-10-CM

## 2020-05-30 MED ORDER — MUPIROCIN 2 % EX OINT: 1.0000 "application " | TOPICAL_OINTMENT | Freq: Every day | CUTANEOUS | 1 refills | Status: DC

## 2020-05-30 MED ORDER — DOXYCYCLINE HYCLATE 20 MG PO TABS: 20.0000 mg | ORAL_TABLET | Freq: Two times a day (BID) | ORAL | 1 refills | Status: AC

## 2020-05-30 MED ORDER — IVERMECTIN 1 % EX CREA: 1.0000 "application " | TOPICAL_CREAM | Freq: Every day | CUTANEOUS | 5 refills | Status: DC

## 2020-05-30 NOTE — Progress Notes (Signed)
Follow-Up Visit   Subjective  Judy Prince is a 65 y.o. female who presents for the following: Follow-up (Patient here today for 1 month AK follow up at left nose. Treated with LN2. ) and Alopecia (Patient here to discuss hair loss. She did have Covid at Christmas but noticed hair loss prior to that. She is not using any type of treatment but advises she does have itching and bumps at scalp. ).  Patient also has a spot at left ear that she scratches and is scaly.   The following portions of the chart were reviewed this encounter and updated as appropriate:   Tobacco  Allergies  Meds  Problems  Med Hx  Surg Hx  Fam Hx      Review of Systems:  No other skin or systemic complaints except as noted in HPI or Assessment and Plan.  Objective  Well appearing patient in no apparent distress; mood and affect are within normal limits.  A focused examination was performed including face, neck, chest and back and scalp. Relevant physical exam findings are noted in the Assessment and Plan.  Objective  Left Nose: Erythematous thin papules/macules with gritty scale.   Objective  face: Mid face erythema, rare inflammatory papule  Objective  Neck - Anterior: Excoriations at upper back  Objective  Scalp: Diffuse thinning of the crown and widening of the midline part with retention of the frontal hairline - Reviewed progressive nature and prognosis.   Objective  Scalp: Perifollicular papules chest and scalp  Objective  right cheek: 0.5cm excoriated firm pink papule   Assessment & Plan  AK (actinic keratosis) Left Nose  Prior to procedure, discussed risks of blister formation, small wound, skin dyspigmentation, or rare scar following cryotherapy.    Destruction of lesion - Left Nose Complexity: simple   Destruction method: cryotherapy   Informed consent: discussed and consent obtained   Lesion destroyed using liquid nitrogen: Yes   Cryotherapy cycles:  2 Outcome:  patient tolerated procedure well with no complications   Post-procedure details: wound care instructions given    Rosacea face  + grittiness of eyes by report  Chronic condition with duration or expected duration over one year. Condition is bothersome to patient. Currently flared.  Restart Soolantra QD  Pending ok from ophthalmology (has cataract surgery soon), can start doxycycline 20 mg twice a day with food  Doxycycline should be taken with food to prevent nausea. Do not lay down for 30 minutes after taking. Be cautious with sun exposure and use good sun protection while on this medication. Pregnant women should not take this medication.    Ordered Medications: Ivermectin (SOOLANTRA) 1 % CREA doxycycline (PERIOSTAT) 20 MG tablet  Excoriation Neck - Anterior  Start mupirocin to open areas  Ordered Medications: mupirocin ointment (BACTROBAN) 2 %  Alopecia Scalp  Androgenetic. Chronic, progressive  Discussed treating PO with prescription finasteride vs minoxidil, Revian red light cap. Deferred today  Recommend minoxidil 5% (Rogaine for men) solution or foam to be applied to the scalp and left in. This should ideally be used twice daily for best results but it helps with hair regrowth when used at least three times per week. Rogaine initially can cause increased hair shedding for the first few weeks but this will stop with continued use. In studies, people who used minoxidil (Rogaine) for at least 6 months had thicker hair than people who did not. Minoxidil topical (Rogaine) only works as long as it continues to be used. If  if it is no longer used then the hair it has been helping to regrow can fall out. Minoxidil topical (Rogaine) can cause increased facial hair growth which can usually be managed easily with a battery-operated hair trimmer. If facial hair growth is bothersome, switching to the 2% women's version can decrease the risk of unwanted facial hair  growth.   Folliculitis Scalp  Start clindamycin to affected areas at scalp.  Benign neoplasm of skin of face right cheek  Favor prurigo nodule. Symptomatic  Consider bx if indicated at follow up  Prior to procedure, discussed risks of blister formation, small wound, skin dyspigmentation, or rare scar following cryotherapy.    Destruction of lesion - right cheek Complexity: simple   Destruction method: cryotherapy   Informed consent: discussed and consent obtained   Lesion destroyed using liquid nitrogen: Yes   Cryotherapy cycles:  2 Outcome: patient tolerated procedure well with no complications   Post-procedure details: wound care instructions given    Return in about 2 months (around 07/30/2020) for AK follow up.  Graciella Belton, RMA, am acting as scribe for Forest Gleason, MD .  Documentation: I have reviewed the above documentation for accuracy and completeness, and I agree with the above.  Forest Gleason, MD

## 2020-05-30 NOTE — Patient Instructions (Addendum)
Recommend OTC Gold Bond Rapid Relief Anti-Itch cream (pramoxine + menthol) up to 3 times per day to areas that are itchy.  Cryotherapy Aftercare  . Wash gently with soap and water everyday.   Marland Kitchen Apply Vaseline and Band-Aid daily until healed.  Prior to procedure, discussed risks of blister formation, small wound, skin dyspigmentation, or rare scar following cryotherapy.    Discussed treating hair loss with oral finasteride and/or topical minoxidil.   Recommend minoxidil 5% (Rogaine for men) solution or foam to be applied to the scalp and left in. This should ideally be used twice daily for best results but it helps with hair regrowth when used at least three times per week. Rogaine initially can cause increased hair shedding for the first few weeks but this will stop with continued use. In studies, people who used minoxidil (Rogaine) for at least 6 months had thicker hair than people who did not. Minoxidil topical (Rogaine) only works as long as it continues to be used. If if it is no longer used then the hair it has been helping to regrow can fall out. Minoxidil topical (Rogaine) cause increased facial hair growth.  Doxycycline should be taken with food to prevent nausea. Do not lay down for 30 minutes after taking. Be cautious with sun exposure and use good sun protection while on this medication. Pregnant women should not take this medication.    Your medications have been sent to [facility] and will be mailed to you after you call them and confirm your information with them.   [facility] [phone] [address], [place], [place] [address]

## 2020-06-02 ENCOUNTER — Encounter: Payer: Self-pay | Admitting: Dermatology

## 2020-06-14 ENCOUNTER — Other Ambulatory Visit: Payer: Self-pay | Admitting: Internal Medicine

## 2020-06-14 DIAGNOSIS — Z1231 Encounter for screening mammogram for malignant neoplasm of breast: Secondary | ICD-10-CM

## 2020-06-29 ENCOUNTER — Other Ambulatory Visit: Payer: Self-pay | Admitting: Cardiovascular Disease

## 2020-06-29 DIAGNOSIS — E785 Hyperlipidemia, unspecified: Secondary | ICD-10-CM

## 2020-07-03 ENCOUNTER — Other Ambulatory Visit: Payer: Self-pay

## 2020-07-03 ENCOUNTER — Ambulatory Visit
Admission: RE | Admit: 2020-07-03 | Discharge: 2020-07-03 | Disposition: A | Discharge status: Home / Self Care | Payer: Medicare HMO | Source: Ambulatory Visit | Attending: Internal Medicine | Admitting: Internal Medicine

## 2020-07-03 DIAGNOSIS — Z1231 Encounter for screening mammogram for malignant neoplasm of breast: Secondary | ICD-10-CM | POA: Insufficient documentation

## 2020-07-07 NOTE — Progress Notes (Signed)
Office Visit    Patient Name: Judy Prince Date of Encounter: 07/08/2020  PCP:  Rusty Aus, MD   [place]  Cardiologist:  Kathlyn Sacramento, MD  Advanced Practice Provider:  No care team member to display Electrophysiologist:  None :[phone]}   Chief Complaint    Chief Complaint  Patient presents with  . Follow-up    6 Months follow up. Medications verbally reviewed with patient.    65 year old female with history of coronary artery disease, hypertension, hyperlipidemia, and here today for follow-up of CAD.  Past Medical History    Past Medical History:  Diagnosis Date  . Arthritis   . Coronary artery disease   . GERD (gastroesophageal reflux disease)   . History of kidney stones   . Hypertension   . Osteoporosis    Past Surgical History:  Procedure Laterality Date  . ABDOMINAL HYSTERECTOMY    . BREAST BIOPSY Right 03/15/2017   PSEUDO-ANGIOMATOUS STROMAL HYPERPLASIA.   Marland Kitchen CHOLECYSTECTOMY    . COLONOSCOPY    . CORONARY ANGIOPLASTY WITH STENT PLACEMENT  2007   3 stents  . ESOPHAGOGASTRODUODENOSCOPY    . FINGER ARTHRODESIS Left 03/26/2016   Procedure: ARTHRODESIS FINGER;  Surgeon: Hessie Knows, MD;  Location: [facility] ORS;  Service: Orthopedics;  Laterality: Left;  . TUBAL LIGATION      Allergies  No Known Allergies  History of Present Illness    Judy Prince is a 65 y.o. female with PMH as above.   She has history of CAD.  In the past, she is followed by Dr. Saralyn Pilar.  She reported anginal symptoms in 2007 with subsequent cardiac catheterization showing 70% mid RCA stenosis with moderate tortuosity in the vessel.  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  After that time, she has reportedly done well without recurrent cardiac events on DAPT with ASA and clopidogrel.  She reports that she has been feeling good since that time.  She reports occasional  left-sided pain, attributed to musculoskeletal etiology.  Pain improved with Voltaren gel. Pain is described as different from that of before her catheterization and that it occurs on the left side of her chest, rather than that of her center of her chest. Previous pain in 2007 was also described as a pressure, rather than soreness. She denies any clear triggers for the pain.  It sometimes occurs after she eats.  She also feels the pain while sitting and working.  Pain is not clearly associated with exertion.  She reports myalgias, including frequent pain in the shoulders and knee.  No racing heart rate or palpitations.  She reports amaurosis fugax.  No presyncope, syncope, or signs or symptoms of volume overload.  BP noted to be elevated today with patient report that she has not yet taken her blood pressure medication, as she usually has to eat something before taking it.  She is not checking her blood pressure at home.  She reports that she does not sleep well and occasionally snores.  Watch pat was discussed and declined today, though she will continue to think about it.  She does not track her diet.  No signs or symptoms of bleeding.  Home Medications    Current Outpatient Medications on File Prior to Visit  Medication Sig Dispense Refill  . acetaminophen (TYLENOL) 500 MG tablet Take 1,000 mg by mouth every 6 (six) hours as needed for moderate pain or headache.    Marland Kitchen  aspirin EC 81 MG tablet Take 81 mg by mouth daily.     . Calcium Citrate-Vitamin D (CALCIUM + D PO) Take 1 tablet by mouth daily.    . clopidogrel (PLAVIX) 75 MG tablet Take 75 mg by mouth daily.     . diclofenac Sodium (VOLTAREN) 1 % GEL Apply topically as needed.    . etodolac (LODINE) 400 MG tablet Take by mouth.    . ezetimibe (ZETIA) 10 MG tablet TAKE 1 TABLET BY MOUTH EVERY DAY 90 tablet 0  . famotidine (PEPCID) 20 MG tablet Take 40 mg by mouth at bedtime.    . fluticasone (FLONASE) 50 MCG/ACT nasal spray USE 2 SPRAYS IN EACH  NOSTRIL DAILY AS NEEDED FOR ALLERGIES    . loratadine (CLARITIN) 10 MG tablet Take 10 mg by mouth daily at 12 noon.    . Menthol, Topical Analgesic, (BENGAY EX) Apply 1 application topically daily.    . metoprolol (LOPRESSOR) 50 MG tablet Take 50 mg by mouth 2 (two) times daily.     . mupirocin ointment (BACTROBAN) 2 % Apply 1 application topically daily. 22 g 1  . pantoprazole (PROTONIX) 40 MG tablet Take 40mg s daily may take an additional 40mg s as needed for indigestion    . rosuvastatin (CRESTOR) 20 MG tablet Take 20 mg by mouth every evening.     . sertraline (ZOLOFT) 50 MG tablet Take 50 mg by mouth daily before lunch.   12   No current facility-administered medications on file prior to visit.    Review of Systems    She reports left-sided chest pain without clear triggers and attributed to MSK etiology, improving with Voltaren.  She reports pain in her shoulders and knee.  She reports amaurosis fugax.  She denies palpitations, dyspnea, pnd, orthopnea, n, v, dizziness, syncope, edema, weight gain, or early satiety.  All other systems reviewed and are otherwise negative except as noted above.  Physical Exam    VS:  BP (!) 148/78 (BP Location: Left Arm, Patient Position: Sitting, Cuff Size: Normal)   Ht 5\' 1"  (1.549 m)   Wt 175 lb (79.4 kg)   LMP  (LMP Unknown)   SpO2 97%   BMI 33.07 kg/m  , BMI Body mass index is 33.07 kg/m. GEN: Well nourished, well developed, in no acute distress.  Facemask in place. HEENT: normal. Neck: Supple, no JVD, carotid bruits, or masses. Cardiac: RRR, 1/6 systolic murmur at 5th ICS. No rubs or gallops. No clubbing, cyanosis, or pitting edema.  Radials/DP/PT 2+ and equal bilaterally.  Respiratory:  Respirations regular and unlabored, clear to auscultation bilaterally. GI: Soft, nontender, nondistended, BS + x 4. MS: no deformity or atrophy. Skin: warm and dry, no rash. Neuro:  Strength and sensation are intact. Psych: Normal affect.  Accessory  Clinical Findings    ECG personally reviewed by me today -NSR, 63 bpm- no acute changes.  VITALS Reviewed today   Temp Readings from Last 3 Encounters:  11/20/19 98.4 F (36.9 C) (Oral)  03/26/16 97.7 F (36.5 C)  05/15/15 98.4 F (36.9 C) (Oral)   BP Readings from Last 3 Encounters:  07/08/20 (!) 148/78  12/07/19 (!) 170/80  11/20/19 (!) 163/84   Pulse Readings from Last 3 Encounters:  12/07/19 66  11/20/19 76  06/01/19 75    Wt Readings from Last 3 Encounters:  07/08/20 175 lb (79.4 kg)  12/07/19 168 lb 6 oz (76.4 kg)  11/20/19 165 lb (74.8 kg)     LABS  reviewed today   Lab Results  Component Value Date   WBC 6.5 03/20/2016   HGB 14.6 03/26/2016   HCT 43.0 03/26/2016   MCV 87.9 03/20/2016   PLT 259 03/20/2016   Lab Results  Component Value Date   NA 141 03/26/2016   K 3.3 (L) 03/26/2016   No results found for: ALT, AST, GGT, ALKPHOS, BILITOT No results found for: CHOL, HDL, LDLCALC, LDLDIRECT, TRIG, CHOLHDL  No results found for: HGBA1C No results found for: TSH   Care everywhere labs: 02/2020 glucose 86, sodium 142, potassium 3.9, CO2 27.1, creatinine 0.7, BUN 16 12/2019 WBC 6.9, RBC 4.76, hemoglobin 14.7, hematocrit 44.2, platelets 306 12/2019: Total cholesterol 169, triglycerides 225, HDL 50, LDL 74 07/2019: TSH 1.65 07/2019: Magnesium 1.9  STUDIES/PROCEDURES reviewed today   2018  Stress and echo stress (cannot access most of this data) INTERPRETATION  Normal Stress Echocardiogram  NORMAL RIGHT VENTRICULAR SYSTOLIC FUNCTION  MILD VALVULAR REGURGITATION (See above)  Trivial MR, mild TR NO VALVULAR STENOSIS NOTED  Stress Duration:2:29 mm:ss   Max Stress H.R.:142 bpm    Target Heart Rate Achieved: Yes  Maximum workload of 8.90 METS was achieved during exercise    MPI 02/2009 1.     Normal left ventricular function.  2.     Normal wall motion.  3.     Normal Sestamibi scintigraphy without evidence of scar or ischemia.    2007 cath  (not in EMR)  06/2019 LE arterial study Summary:  Right: Resting right ankle-brachial index is within normal range.  No evidence of significant right lower extremity arterial disease.  Left: Resting left ankle-brachial index is within normal range.  No evidence of significant left lower extremity arterial disease. The left  toe-brachial index is normal.   Assessment & Plan    Coronary artery disease Chest pain, suspect MSK --Reports chest pain with mostly atypical features as described above.  No clear exacerbating or alleviating factors, other than the relief she gets with Voltaren gel.  Described as different from that of 2007 CP as above.  Previous CV studies as above with most recent from 2018. As we discussed, CP suspicious for MSK etiology; however, given her history of CAD and mild valvular dz, we will update a stress and echo.   Ordered echo to recheck EF, wall motion, valvular function, and rule out acute structural changes.   Ordered Treadmill stress test also ordered for further risk factor stratification.  If her knee is bothering her the day of the stress test, we can flip this to a [employer].  She prefers to attempt a treadmill stress test over that of chemical stress test if possible.    Ongoing RF modification discussed, including BP and cholesterol control. Diet and activity reviewed.   Continue current medications, including indefinite DAPT, BB, and statin.  HLD, LDL goal below 70 Hypertriglyceridemia --12/2019 total cholesterol 169, triglycerides 225, LDL 74.  We discussed LDL goal of below 70. She does not tolerate a higher dose of rosuvastatin.  We revisited the previous recommendation for Vascepa, given her elevated triglycerides, as well as PCSK9i. Pt preference is to defer both - reassess start of Vascepa / PCSK9i at RTC. Recommend ongoing heart healthy diet and increased activity as tolerated. Continue Crestor 20 mg and Zetia 10 mg.   Essential  hypertension, goal BP <130/80 --BP today suboptimal at 148/78.  She reports that she has not yet taken her blood pressure medication.  Discussed home BP checks 2 hours  after her medications.  No medication changes made today, given her BP on her medications is unknown at this time. Reassess at RTC.    Amaurosis fugax --Reports amaurosis fugax.  She does have RF for carotid dz. No presyncope or syncope.  No bruits appreciated on exam.  Will update previous carotid studies.  Disposition: RTC 6 months or sooner if needed   Arvil Chaco, PA-C 07/08/2020

## 2020-07-08 ENCOUNTER — Ambulatory Visit: Payer: Medicare HMO | Admitting: Physician Assistant

## 2020-07-08 ENCOUNTER — Encounter: Payer: Self-pay | Admitting: Physician Assistant

## 2020-07-08 ENCOUNTER — Other Ambulatory Visit: Payer: Self-pay

## 2020-07-08 VITALS — BP 148/78 | Ht 61.0 in | Wt 175.0 lb

## 2020-07-08 DIAGNOSIS — R079 Chest pain, unspecified: Secondary | ICD-10-CM

## 2020-07-08 DIAGNOSIS — I10 Essential (primary) hypertension: Secondary | ICD-10-CM | POA: Diagnosis not present

## 2020-07-08 DIAGNOSIS — I2510 Atherosclerotic heart disease of native coronary artery without angina pectoris: Secondary | ICD-10-CM | POA: Diagnosis not present

## 2020-07-08 DIAGNOSIS — E785 Hyperlipidemia, unspecified: Secondary | ICD-10-CM | POA: Diagnosis not present

## 2020-07-08 DIAGNOSIS — G453 Amaurosis fugax: Secondary | ICD-10-CM

## 2020-07-08 NOTE — Patient Instructions (Addendum)
Medication Instructions:  Your physician recommends that you continue on your current medications as directed. Please refer to the Current Medication list given to you today.  *If you need a refill on your cardiac medications before your next appointment, please call your pharmacy*   Lab Work: None ordered   Testing/Procedures:  1) Your physician has requested that you have an echocardiogram. Echocardiography is a painless test that uses sound waves to create images of your heart. It provides your doctor with information about the size and shape of your heart and how well your heart's chambers and valves are working. This procedure takes approximately one hour. There are no restrictions for this procedure.  2) Your physician has requested that you have a carotid duplex. This test is an ultrasound of the carotid arteries in your neck. It looks at blood flow through these arteries that supply the brain with blood. Allow one hour for this exam. There are no restrictions or special instructions.  3) [place]  Your caregiver has ordered a Treadmill Myoview Stress Test with nuclear imaging. The purpose of this test is to evaluate the blood supply to your heart muscle. This procedure is referred to as a "Non-Invasive Stress Test." This is because other than having an IV started in your vein, nothing is inserted or "invades" your body. Cardiac stress tests are done to find areas of poor blood flow to the heart by determining the extent of coronary artery disease (CAD). Some patients exercise on a treadmill, which naturally increases the blood flow to your heart, while others who are  unable to walk on a treadmill due to physical limitations have a pharmacologic/chemical stress agent called Lexiscan . This medicine will mimic walking on a treadmill by temporarily increasing your coronary blood flow.   Please note: these test may take anywhere between 2-4 hours to complete  PLEASE REPORT TO [facility] AT THE FIRST DESK WILL DIRECT YOU WHERE TO GO  Date of Procedure:_____________________________________  Arrival Time for Procedure:______________________________  Instructions regarding medication:   ____ : Hold diabetes medication morning of procedure  __X__:  Hold betablocker(s) night before procedure and morning of procedure - Metoprolol   PLEASE NOTIFY THE OFFICE AT LEAST 24 HOURS IN ADVANCE IF YOU ARE UNABLE TO KEEP YOUR APPOINTMENT.  [phone] AND  PLEASE NOTIFY NUCLEAR MEDICINE AT [facility] AT LEAST 24 HOURS IN ADVANCE IF YOU ARE UNABLE TO KEEP YOUR APPOINTMENT. [phone]  How to prepare for your Myoview test:  1. Do not eat or drink after midnight 2. No caffeine for 24 hours prior to test 3. No smoking 24 hours prior to test. 4. Your medication may be taken with water.  If your doctor stopped a medication because of this test, do not take that medication. 5. Ladies, please do not wear dresses.  Skirts or pants are appropriate. Please wear a short sleeve shirt. 6. No perfume, cologne or lotion. 7. Wear comfortable walking shoes. No heels!   COVID PRE- TEST: You will need a COVID TEST prior to the procedure:   LOCATION: Pre-Admit testing office, [address] in the [place]                                   located on the [place]  at [address], [place], [place] [address]   DATE/TIME: ______________ (anytime between 8 am and 2 pm)     Follow-Up: At [facility], you and your health needs are our priority.  As part of our continuing mission to provide you with exceptional heart care, we have created designated Provider Care Teams.  These Care Teams include your primary Cardiologist (physician) and Advanced Practice Providers (APPs -  Physician Assistants and Nurse Practitioners) who all work together to provide you with the care you need, when you  need it.  We recommend signing up for the patient portal called "MyChart".  Sign up information is provided on this After Visit Summary.  MyChart is used to connect with patients for Virtual Visits (Telemedicine).  Patients are able to view lab/test results, encounter notes, upcoming appointments, etc.  Non-urgent messages can be sent to your provider as well.   To learn more about what you can do with MyChart, go to [email or web address].    Your next appointment:    Follow up after studies  The format for your next appointment:   In Person  Provider:   You may see Kathlyn Sacramento, MD or one of the following Advanced Practice Providers on your designated Care Team:    Murray Hodgkins, NP  Christell Faith, PA-C  Marrianne Mood, PA-C  Cadence Kathlen Mody, [place]  Laurann Montana, NP    Other Instructions   Think about the Watch Pat   Blood pressure: --We recommend upper arm BP cuffs over that of the wrist. --You can always check your device's accuracy against an office model once a year if possible. You can do so by bringing your cuff into the office and notifying the person that rooms you that you would like to check your cuff against our office readings. --Measure your BP at the same time each day. Blood pressure varies often throughout the day with higher readings in the morning. BP may also be lower at home than in the office. Do not measure BP right after you wake up or after exercising. Avoid caffeine, tobacco, and alcohol for 30 minutes before taking a measurement.  --Sit quietly for five minutes in a comfortable position with your legs and ankles uncrossed and back supported. Feet flat on the ground. Have your arm supported and at the level of your heart. Always use the same arm when taking your blood pressure. Place the cuff over bare skin rather than clothing. Each time you measure, take an additional reading if abnormal to ensure accurate by waiting 1-3 minutes after the first  reading. --Please bring a BP log into the office. It is helpful to document the time of each BP reading, as well as any activity or medications taken around the reading. In addition, it is helpful to include heart rate at the time of the BP reading. Daily weights are also encouraged. --Goal BP is 130/80 or lower. If your BP is low but you are not dizzy, this is fine and preferred over BP higher than 130/80. If your BP is less than 100 for the top number and you are dizzy, call the office. If your blood pressure is consistently elevated with top number above 180 and bottom above 120, this can damage the body. If you have severe increase in your blood pressure or concerning symptoms of severe chest pain, headache with confusion and blurred vision, severe abdominal or back pain, shortness of breath, seizures, or loss of consciousness, go to the emergency department.

## 2020-07-30 ENCOUNTER — Ambulatory Visit: Payer: Medicare HMO

## 2020-08-06 ENCOUNTER — Other Ambulatory Visit: Payer: Self-pay

## 2020-08-06 ENCOUNTER — Ambulatory Visit
Admission: RE | Admit: 2020-08-06 | Discharge: 2020-08-06 | Disposition: A | Discharge status: Home / Self Care | Payer: Medicare HMO | Source: Ambulatory Visit | Attending: Physician Assistant | Admitting: Physician Assistant

## 2020-08-06 DIAGNOSIS — R079 Chest pain, unspecified: Secondary | ICD-10-CM | POA: Diagnosis present

## 2020-08-06 LAB — NM MYOCAR MULTI W/SPECT W/WALL MOTION / EF
Estimated workload: 1 METS
Exercise duration (min): 0 min
Exercise duration (sec): 0 s
LV dias vol: 48 mL (ref 46–106)
LV sys vol: 14 mL
MPHR: 153 {beats}/min
Peak HR: 98 {beats}/min
Percent HR: 64 %
Rest HR: 59 {beats}/min
SDS: 0
SRS: 4
SSS: 0
TID: 0.97

## 2020-08-06 MED ORDER — REGADENOSON 0.4 MG/5ML IV SOLN
0.4000 mg | Freq: Once | INTRAVENOUS | Status: AC
  Administered 2020-08-06: 0.4 mg via INTRAVENOUS

## 2020-08-06 MED ORDER — TECHNETIUM TC 99M TETROFOSMIN IV KIT
10.3500 | PACK | Freq: Once | INTRAVENOUS | Status: AC | PRN
  Administered 2020-08-06: 10.35 via INTRAVENOUS

## 2020-08-06 MED ORDER — TECHNETIUM TC 99M TETROFOSMIN IV KIT
30.5000 | PACK | Freq: Once | INTRAVENOUS | Status: AC | PRN
  Administered 2020-08-06: 30.5 via INTRAVENOUS

## 2020-08-08 ENCOUNTER — Ambulatory Visit (INDEPENDENT_AMBULATORY_CARE_PROVIDER_SITE_OTHER): Payer: Medicare HMO

## 2020-08-08 ENCOUNTER — Other Ambulatory Visit: Payer: Self-pay

## 2020-08-08 DIAGNOSIS — R079 Chest pain, unspecified: Secondary | ICD-10-CM

## 2020-08-08 DIAGNOSIS — G453 Amaurosis fugax: Secondary | ICD-10-CM

## 2020-08-08 LAB — ECHOCARDIOGRAM COMPLETE
AR max vel: 2.73 cm2
AV Area VTI: 2.77 cm2
AV Area mean vel: 2.51 cm2
AV Mean grad: 4 mmHg
AV Peak grad: 7.6 mmHg
Ao pk vel: 1.38 m/s
Area-P 1/2: 3.42 cm2
Calc EF: 58.2 %
S' Lateral: 2.5 cm
Single Plane A2C EF: 54.7 %
Single Plane A4C EF: 58.2 %

## 2020-08-09 ENCOUNTER — Telehealth: Payer: Self-pay | Admitting: Cardiovascular Disease

## 2020-08-09 NOTE — Telephone Encounter (Signed)
Echo and MPI resulted!

## 2020-08-09 NOTE — Telephone Encounter (Signed)
Patient calling to check on status of results °

## 2020-08-09 NOTE — Telephone Encounter (Signed)
Patient made aware of myoview and echo results with verbalized understanding.

## 2020-08-09 NOTE — Telephone Encounter (Signed)
Stress test was ordered by Marrianne Mood, PA

## 2020-08-12 ENCOUNTER — Telehealth: Payer: Self-pay | Admitting: Physician Assistant

## 2020-08-12 NOTE — Telephone Encounter (Signed)
Spoke to pt. See Bp readings below. States 187/77 was prior to BP meds but all other readings were 2 hrs after BP meds.  HR's have been 65-70.  Pt saw Dr. Sabra Heck (PCP) "a couple weeks ago" states she has not been "feeling well" since this time. Potassium was low at visit and pt's HCTZ was held. 1 week potassium supplement with repeat labs returned normal 4/29.  Pt had cortisone shot in shoulder 1 week ago at PCP. "I always feel bad and have headache after my cortisone shot."  Pt also reports back pain. Discussed with pt with combination of back pain, cortisone shot and holding HCTZ, these all could be contributing to elevated BP.  Advised that she continue to monitor BP and contact office of worsening s/s'x, new onset blurred vision, weakness, incr BP.  Pt has follow up scheduled this week with Marrianne Mood, PA 5/19 to f/u BP and medications.  Pt verbalized understanding. Has no further questions at this time.

## 2020-08-12 NOTE — Telephone Encounter (Signed)
Pt c/o BP issue: STAT if pt c/o blurred vision, one-sided weakness or slurred speech  1. What are your last 5 BP readings?  187/77 151/68  166/76  158/76 178/79 157/71  162/79   2. Are you having any other symptoms (ex. Dizziness, headache, blurred vision, passed out)? Nervous, anxious,  doesn't feel good,  headache   3. What is your BP issue? Continues to be elevated recently dc hctz due to potassium patient took one yesterday but bp is still up

## 2020-08-15 ENCOUNTER — Encounter: Payer: Self-pay | Admitting: Physician Assistant

## 2020-08-15 ENCOUNTER — Other Ambulatory Visit: Payer: Self-pay

## 2020-08-15 ENCOUNTER — Ambulatory Visit: Payer: Medicare HMO | Admitting: Physician Assistant

## 2020-08-15 VITALS — BP 160/88 | HR 78 | Ht 61.0 in | Wt 169.0 lb

## 2020-08-15 DIAGNOSIS — G4730 Sleep apnea, unspecified: Secondary | ICD-10-CM | POA: Diagnosis not present

## 2020-08-15 DIAGNOSIS — R0789 Other chest pain: Secondary | ICD-10-CM

## 2020-08-15 DIAGNOSIS — E785 Hyperlipidemia, unspecified: Secondary | ICD-10-CM | POA: Diagnosis not present

## 2020-08-15 DIAGNOSIS — I2510 Atherosclerotic heart disease of native coronary artery without angina pectoris: Secondary | ICD-10-CM | POA: Diagnosis not present

## 2020-08-15 DIAGNOSIS — I10 Essential (primary) hypertension: Secondary | ICD-10-CM

## 2020-08-15 DIAGNOSIS — E781 Pure hyperglyceridemia: Secondary | ICD-10-CM

## 2020-08-15 MED ORDER — EZETIMIBE 10 MG PO TABS: 10.0000 mg | ORAL_TABLET | Freq: Every day | ORAL | 3 refills | Status: DC

## 2020-08-15 MED ORDER — ICOSAPENT ETHYL 1 G PO CAPS: 2.0000 g | ORAL_CAPSULE | Freq: Two times a day (BID) | ORAL | 6 refills | Status: DC

## 2020-08-15 MED ORDER — LOSARTAN POTASSIUM 25 MG PO TABS: 25.0000 mg | ORAL_TABLET | Freq: Every day | ORAL | 6 refills | Status: DC

## 2020-08-15 NOTE — Progress Notes (Signed)
Office Visit    Patient Name: Judy Prince Date of Encounter: 08/15/2020  PCP:  Rusty Aus, MD   [place]  Cardiologist:  Kathlyn Sacramento, MD  Advanced Practice Provider:  No care team member to display Electrophysiologist:  None :[id number]}   Chief Complaint    Chief Complaint  Patient presents with  . Follow-up    Follow up and medications verbally reviewed with patient.     65 year old female with history of coronary artery disease, hypertension, hyperlipidemia, and here today for follow-up of CAD.  Past Medical History    Past Medical History:  Diagnosis Date  . Arthritis   . Coronary artery disease   . GERD (gastroesophageal reflux disease)   . History of kidney stones   . Hypertension   . Osteoporosis    Past Surgical History:  Procedure Laterality Date  . ABDOMINAL HYSTERECTOMY    . BREAST BIOPSY Right 03/15/2017   PSEUDO-ANGIOMATOUS STROMAL HYPERPLASIA.   Marland Kitchen CHOLECYSTECTOMY    . COLONOSCOPY    . CORONARY ANGIOPLASTY WITH STENT PLACEMENT  2007   3 stents  . ESOPHAGOGASTRODUODENOSCOPY    . FINGER ARTHRODESIS Left 03/26/2016   Procedure: ARTHRODESIS FINGER;  Surgeon: Hessie Knows, MD;  Location: [facility] ORS;  Service: Orthopedics;  Laterality: Left;  . TUBAL LIGATION      Allergies  No Known Allergies  History of Present Illness    Judy Prince is a 65 y.o. female with PMH as above.   She has history of CAD.  In the past, she is followed by Dr. Saralyn Pilar.  She reported anginal symptoms in 2007 with subsequent cardiac catheterization showing 70% mid RCA stenosis with moderate tortuosity in the vessel.  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  After that time, she has reportedly done well without recurrent cardiac events on DAPT with ASA and clopidogrel.  She was seen 07/08/2020 and noted occasional left-sided chest pain, attributed to MSK  etiology.  Pain improved with Voltaren gel.  It was described as different from before her catheterization, as it occurred on the left side, rather than in the center of her chest.  The pain before her catheterization was also a pressure, rather than soreness.  She felt the pain when eating, sitting, working.  It was not clearly associated with exertion.  She reported pain in her shoulders and knees.  She also noted amaurosis fugax with carotid study ordered.  BP elevated, though she had not taken her blood pressure medication.  She was not checking her blood pressure at home.  She reported that she did not sleep well and occasionally snored with watch pat discussed and patient preference to defer watch pat at that time.  Recommendation was to repeat echo.  08/06/2020 MPI performed and ruled a normal study/low risk study with hyperdynamic LVEF greater than 65% and no evidence of ischemia.  08/08/2020 echo showed EF 60 to 65%, NR WMA, G1 DD, mild LAE, trivial MR, mild TR.    08/08/2020 carotids performed and without evidence of thrombus, dissection, or plaque.  08/12/2020, she called the office with BP 187/77 prior to medication readings.  With his elevated pressure, she reported feeling anxious and unwell with a headache.  HCTZ had reportedly been held for low potassium.  Per phone note, she had still taken the HCTZ due to elevated pressure for a couple of days, though this did not help her.  She noted recently receiving a cortisone shot.  Today, 08/15/2020, she returns to clinic and notes she is doing about the same as previous clinic visits.  She continues to have left-sided MSK like pain as described above and ever since her catheterization/stenting.  Pain is described as tender to palpation and a soreness. She has been working in the yard and picking up rocks recently, and she noticed that she became short of breath and tired more easily than in the past.  She usually recovers after sitting down for a  while.  No shortness of breath at rest today.  She denies any significant symptoms of volume overload.  Echo reviewed as above with recommendation for BP control.  BP today suboptimal at 160/88 with HR 78 bpm.  She notes she was taking HCTZ for couple of days but has since discontinued this medication as above and due to low potassium.  She continues to note knee/MSK/arthritic pain as above, exacerbated by her sedentary job and repetitive motions it requires, and wonders how much this contributes to her elevated BP.  Also noted was cortisone shot, though it has been some time since receipt of this injection.  She will retire in October 2022, which is 25 years at that time.  She does not drink a lot of caffeine, usually only drinking half of her cup.  She does continue to note that she snores and feels fatigued throughout the day.  She is agreeable to watch Pat after thinking about it further between visits.  STOP-BANG Rex assessment  S-snore  Have you been told that you snore?   1  T-tired Are you often tired, fatigued, sleepy during the day?   1  O-obstruction Do you stop breathing, choke, or gasp during sleep?   0  P-pressure Do you have or are you being treated for high blood pressure?  1  B- BMI Is your BMI greater than 35 kg/m?  0  A- age Are you 58 years or older?  1  N-neck Do you have a neck circumference greater than 16 inches?  0  G-gender Are you female?   0  Total  4     Home Medications    Current Outpatient Medications on File Prior to Visit  Medication Sig Dispense Refill  . acetaminophen (TYLENOL) 500 MG tablet Take 1,000 mg by mouth every 6 (six) hours as needed for moderate pain or headache.    Marland Kitchen aspirin EC 81 MG tablet Take 81 mg by mouth daily.     . Calcium Citrate-Vitamin D (CALCIUM + D PO) Take 1 tablet by mouth daily.    . clopidogrel (PLAVIX) 75 MG tablet Take 75 mg by mouth daily.     Marland Kitchen ezetimibe (ZETIA) 10 MG tablet TAKE 1 TABLET BY MOUTH EVERY DAY 90 tablet 0   . famotidine (PEPCID) 20 MG tablet Take 40 mg by mouth at bedtime.    . fluticasone (FLONASE) 50 MCG/ACT nasal spray USE 2 SPRAYS IN EACH NOSTRIL DAILY AS NEEDED FOR ALLERGIES    . loratadine (CLARITIN) 10 MG tablet Take 10 mg by mouth daily at 12 noon.    . metoprolol (LOPRESSOR) 50 MG tablet Take 50 mg by mouth 2 (two) times daily.     . pantoprazole (PROTONIX) 40 MG tablet Take 2ms daily may take an additional 476m as needed for indigestion    . rosuvastatin (CRESTOR) 20 MG tablet Take 20 mg by mouth every evening.     . sertraline (ZOLOFT)  50 MG tablet Take 50 mg by mouth daily before lunch.   12   No current facility-administered medications on file prior to visit.    Review of Systems    She has fatigue and dyspnea with moving rocks, resolving with a few minutes rest.  She reports left-sided chest pain without clear triggers and attributed to MSK etiology, improving with Voltaren.  She reports daytime fatigue and poor sleep with snoring.  She reports pain in her shoulders and knee.  She has a headache and poor feeling whenever her blood pressure is elevated.  She denies palpitations, pnd, orthopnea, n, v, dizziness, syncope, edema, weight gain, or early satiety.  All other systems reviewed and are otherwise negative except as noted above.  Physical Exam    VS:  BP (!) 160/88 (BP Location: Left Arm, Patient Position: Sitting, Cuff Size: Normal)   Pulse 78   Ht '5\' 1"'  (1.549 m)   Wt 169 lb (76.7 kg)   LMP  (LMP Unknown)   SpO2 98%   BMI 31.93 kg/m  , BMI Body mass index is 31.93 kg/m. GEN: Well nourished, well developed, in no acute distress.  Facemask in place. HEENT: normal. Neck: Supple, no JVD, carotid bruits, or masses. Cardiac: RRR, 1/6 systolic murmur at 5th ICS. No rubs or gallops. No clubbing, cyanosis, or pitting edema.  Radials/DP/PT 2+ and equal bilaterally.  Respiratory:  Respirations regular and unlabored, clear to auscultation bilaterally. GI: Soft, nontender,  nondistended, BS + x 4. MS: no deformity or atrophy. Skin: warm and dry, no rash. Neuro:  Strength and sensation are intact. Psych: Normal affect.  Accessory Clinical Findings    ECG personally reviewed by me today -NSR,78 bpm- no acute changes.  VITALS Reviewed today   Temp Readings from Last 3 Encounters:  11/20/19 98.4 F (36.9 C) (Oral)  03/26/16 97.7 F (36.5 C)  05/15/15 98.4 F (36.9 C) (Oral)   BP Readings from Last 3 Encounters:  08/15/20 (!) 160/88  07/08/20 (!) 148/78  12/07/19 (!) 170/80   Pulse Readings from Last 3 Encounters:  08/15/20 78  12/07/19 66  11/20/19 76    Wt Readings from Last 3 Encounters:  08/15/20 169 lb (76.7 kg)  07/08/20 175 lb (79.4 kg)  12/07/19 168 lb 6 oz (76.4 kg)     LABS  reviewed today   Lab Results  Component Value Date   WBC 6.5 03/20/2016   HGB 14.6 03/26/2016   HCT 43.0 03/26/2016   MCV 87.9 03/20/2016   PLT 259 03/20/2016   Lab Results  Component Value Date   NA 141 03/26/2016   K 3.3 (L) 03/26/2016   No results found for: ALT, AST, GGT, ALKPHOS, BILITOT No results found for: CHOL, HDL, LDLCALC, LDLDIRECT, TRIG, CHOLHDL  No results found for: HGBA1C No results found for: TSH     Care everywhere labs: (06/2020) Total cholesterol 173, triglycerides 317, HDL 47.5, LDL 62 Glucose 97, sodium 141, potassium 4.0, CO2 28.3, creatinine 0.7, BUN 14, calcium 9.4, AST 16, ALT 16 alk phos 73, albumin 4.1 CBC WBC 7.9, RBC 4.71, hemoglobin 14.0, hematocrit 43.4 TSH 3.7  STUDIES/PROCEDURES reviewed today   Echo 08/08/20 1. Left ventricular ejection fraction, by estimation, is 60 to 65%. The  left ventricle has normal function. The left ventricle has no regional  wall motion abnormalities. Left ventricular diastolic parameters are  consistent with Grade I diastolic  dysfunction (impaired relaxation).  2. Right ventricular systolic function is normal. The right ventricular  size is normal.  3. Left atrial size was  mildly dilated.  4. The mitral valve is normal in structure. Trivial mitral valve  regurgitation.  Mild tricuspid regurgitation.  MPI 08/06/2020  There was no ST segment deviation noted during stress.  The study is normal.  This is a low risk study.  The left ventricular ejection fraction is hyperdynamic (>65%).  There is no evidence for ischemia  Carotids 5/ 02/2021 Summary:  Right Carotid: There was no evidence of thrombus, dissection,  atherosclerotic         plaque or stenosis in the cervical carotid system.   Left Carotid: There was no evidence of thrombus, dissection,  atherosclerotic        plaque or stenosis in the cervical carotid system.   Vertebrals: Bilateral vertebral arteries demonstrate antegrade flow.  Subclavians: Normal flow hemodynamics were seen in bilateral subclavian        arteries.   2018  Stress and echo stress (cannot access most of this data) INTERPRETATION  Normal Stress Echocardiogram  NORMAL RIGHT VENTRICULAR SYSTOLIC FUNCTION  MILD VALVULAR REGURGITATION (See above)  Trivial MR, mild TR NO VALVULAR STENOSIS NOTED  Stress Duration:2:29 mm:ss   Max Stress H.R.:142 bpm    Target Heart Rate Achieved: Yes  Maximum workload of 8.90 METS was achieved during exercise    MPI 02/2009 1.     Normal left ventricular function.  2.     Normal wall motion.  3.     Normal Sestamibi scintigraphy without evidence of scar or ischemia.    2007 cath (not in EMR)  06/2019 LE arterial study Summary:  Right: Resting right ankle-brachial index is within normal range.  No evidence of significant right lower extremity arterial disease.  Left: Resting left ankle-brachial index is within normal range.  No evidence of significant left lower extremity arterial disease. The left  toe-brachial index is normal.   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  She does report this pain has existed since her stenting and is without clear triggers.  Given her history of CAD, echo ordered with nl EF, NRWMA, no acute structural/valvular changes.  MPI low risk and without evidence of ischemia. Ongoing RF modification discussed, including BP and cholesterol control. Diet and activity reviewed. Continue current medications, including indefinite DAPT, BB, and statin.  Of note, most recent care everywhere labs transcribed under CV studies as above.  HLD, LDL goal below 70 Hypertriglyceridemia --06/2020 labs reviewed with total cholesterol 173, triglycerides 317, HDL 47.5, LDL 62.Marland Kitchen  LDL is at goal of below 70.  She continues on Crestor 20 mg daily and Zetia 10 mg daily.  Given her ongoing elevated triglycerides, will add Vascepa 2 g twice daily to her current medications..Recommend ongoing heart healthy diet and increased activity as tolerated.    Essential hypertension, goal BP <130/80 --BP today suboptimal at 160/88 with consideration that she is not taking her HCTZ due to recent hypokalemia.  Will add losartan 25 mg daily.  Reassess at RTC.    Amaurosis fugax -- No significant disease noted on recent carotids as above..  Sleep disordered breathing/snoring -- She continues to note ongoing snoring/fatigue.  Agreeable to watch Pat today with STOP-BANG performed as above in HPI.  We discussed how treatment of sleep apnea helps control BP and overall is  recommended from a cardiovascular standpoint.   Medication changes: Discontinue HCTZ (discontinued between visits due to hypokalemia), start losartan 25 mg daily.  Start Vascepa 2 g twice daily. Labs ordered: BMET, repeat BMET at RTC Studies / Imaging ordered: Watch Pat home sleep study Future considerations: Could consider spironolactone Disposition: RTC 1 month with BMET at that time    Arvil Chaco,  PA-C 08/15/2020

## 2020-08-15 NOTE — Patient Instructions (Addendum)
Medication Instructions:  - Your physician has recommended you make the following change in your medication:   1) Zetia (ezetimibe) has been refilled at the pharmacy  2) START cozaar (losartan) 25 mg- take 1 tablet by mouth ONCE daily   3) START Vascepa 1 gram- take 2 capsules (2 grams) by mouth TWICE daily  Samples Given: Vascepa 1 gram Lot: [id number] Exp: 10/2022 # 4 boxes   *If you need a refill on your cardiac medications before your next appointment, please call your pharmacy*   Lab Work: - Your physician recommends that you have lab work today: [place] [facility] 1st desk on the right to check in, past the screening table Lab hours: Monday- Friday (7:30 am- 5:30 pm)   If you have labs (blood work) drawn today and your tests are completely normal, you will receive your results only by: Marland Kitchen MyChart Message (if you have MyChart) OR . A paper copy in the mail If you have any lab test that is abnormal or we need to change your treatment, we will call you to review the results.   Testing/Procedures: - WatchPat home sleep study: (see instruction sheet given to you today)  DO NOT open the box we have given you until we call you and let you know if this is approved by your insurance or not. If approved, we will call you with a PIN # to activate the test. If it is not approved, we will have you bring the UNOPENED test back to [place].   Follow-Up: At [facility], you and your health needs are our priority.  As part of our continuing mission to provide you with exceptional heart care, we have created designated Provider Care Teams.  These Care Teams include your primary Cardiologist (physician) and Advanced Practice Providers (APPs -  Physician Assistants and Nurse Practitioners) who all work together to provide you with the care you need, when you need it.  We recommend signing up for the patient portal called "MyChart".  Sign up information is provided on this After  Visit Summary.  MyChart is used to connect with patients for Virtual Visits (Telemedicine).  Patients are able to view lab/test results, encounter notes, upcoming appointments, etc.  Non-urgent messages can be sent to your provider as well.   To learn more about what you can do with MyChart, go to [email or web address].    Your next appointment:   1 month(s)  The format for your next appointment:   In Person  Provider:   You may see Kathlyn Sacramento, MD or one of the following Advanced Practice Providers on your designated Care Team:    Murray Hodgkins, NP  Christell Faith, PA-C  Marrianne Mood, PA-C  Cadence Kathlen Mody, [place]  Laurann Montana, NP    Other Instructions  Losartan Tablets What is this medicine? LOSARTAN (loe SAR tan) is an angiotensin II receptor blocker, also known as an ARB. It treats high blood pressure. It can slow kidney damage in some patients. It may also be used to lower the risk of stroke. This medicine may be used for other purposes; ask your health care provider or pharmacist if you have questions. COMMON BRAND NAME(S): Cozaar What should I tell my health care provider before I take this medicine? They need to know if you have any of these conditions:  heart failure  kidney disease  liver disease  an unusual or allergic reaction to losartan, other medicines, foods, dyes, or preservatives  pregnant or  trying to get pregnant  breast-feeding How should I use this medicine? Take this medicine by mouth. Take it as directed on the prescription label at the same time every day. You can take it with or without food. If it upsets your stomach, take it with food. Keep taking it unless your health care provider tells you to stop. Talk to your health care provider about the use of this medicine in children. While it may be prescribed for children as young as 6 for selected conditions, precautions do apply. Overdosage: If you think you have taken too much of this  medicine contact a poison control center or emergency room at once. NOTE: This medicine is only for you. Do not share this medicine with others. What if I miss a dose? If you miss a dose, take it as soon as you can. If it is almost time for your next dose, take only that dose. Do not take double or extra doses. What may interact with this medicine?  aliskiren  ACE inhibitors, like enalapril or lisinopril  diuretics, especially amiloride, eplerenone, spironolactone, or triamterene  lithium  NSAIDs, medicines for pain and inflammation, like ibuprofen or naproxen  potassium salts or potassium supplements This list may not describe all possible interactions. Give your health care provider a list of all the medicines, herbs, non-prescription drugs, or dietary supplements you use. Also tell them if you smoke, drink alcohol, or use illegal drugs. Some items may interact with your medicine. What should I watch for while using this medicine? Visit your health care provider for regular check ups. Check your blood pressure as directed. Ask your health care provider what your blood pressure should be. Also, find out when you should contact him or her. Do not treat yourself for coughs, colds, or pain while you are using this medicine without asking your health care provider for advice. Some medicines may increase your blood pressure. Women should inform their health care provider if they wish to become pregnant or think they might be pregnant. There is a potential for serious side effects to an unborn child. Talk to your health care provider for more information. You may get drowsy or dizzy. Do not drive, use machinery, or do anything that needs mental alertness until you know how this medicine affects you. Do not stand or sit up quickly, especially if you are an older patient. This reduces the risk of dizzy or fainting spells. Alcohol can make you more drowsy and dizzy. Avoid alcoholic drinks. Avoid salt  substitutes unless you are told otherwise by your health care provider. What side effects may I notice from receiving this medicine? Side effects that you should report to your doctor or health care professional as soon as possible:  allergic reactions (skin rash, itching or hives, swelling of the hands, feet, face, lips, throat, or tongue)  breathing problems  high potassium levels (chest pain; or fast, irregular heartbeat; muscle weakness)  kidney injury (trouble passing urine or change in the amount of urine)  low blood pressure (dizziness; feeling faint or lightheaded, falls; unusually weak or tired) Side effects that usually do not require medical attention (report to your doctor or health care professional if they continue or are bothersome):  cough  headache  nasal congestion or stuffiness  nausea or stomach pain This list may not describe all possible side effects. Call your doctor for medical advice about side effects. You may report side effects to FDA at 1-800-FDA-1088. Where should I keep my medicine?  Keep out of the reach of children and pets. Store at room temperature between 20 and 25 degrees C (68 and 77 degrees F). Protect from light. Keep the container tightly closed. Get rid of any unused medicine after the expiration date. To get rid of medicines that are no longer needed or have expired:  Take the medicine to a medicine take-back program. Check with your pharmacy or law enforcement to find a location.  If you cannot return the medicine, check the label or package insert to see if the medicine should be thrown out in the garbage or flushed down the toilet. If you are not sure, ask your health care provider. If it is safe to put in the trash, empty the medicine out of the container. Mix the medicine with cat litter, dirt, coffee grounds, or other unwanted substance. Seal the mixture in a bag or container. Put it in the trash. NOTE: This sheet is a summary. It may not  cover all possible information. If you have questions about this medicine, talk to your doctor, pharmacist, or health care provider.  2021 Elsevier/Gold Standard (2019-05-25 16:16:09)     VASCEPA (Icosapent ethyl) capsules What is this medicine? ICOSAPENT ETHYL (eye KOE sa pent eth il) contains essential fats. It is used to treat high triglyceride levels. Diet and lifestyle changes are often used with this drug. This medicine may be used for other purposes; ask your health care provider or pharmacist if you have questions. COMMON BRAND NAME(S): VASCEPA What should I tell my health care provider before I take this medicine? They need to know if you have any of these conditions:  bleeding disorders  liver disease  an unusual or allergic reaction to icosapent ethyl, fish, shellfish, other medicines, foods, dyes, or preservatives  history of irregular heartbeat  pregnant or trying to get pregnant  breast-feeding How should I use this medicine? Take this medicine by mouth with a glass of water. Follow the directions on the prescription label. Take this medicine with food. Do not cut, crush, dissolve, or chew this medicine. Take your medicine at regular intervals. Do not take it more often than directed. Do not stop taking except on your doctor's advice. Talk to your pediatrician regarding the use of this medicine in children. Special care may be needed. Overdosage: If you think you have taken too much of this medicine contact a poison control center or emergency room at once. NOTE: This medicine is only for you. Do not share this medicine with others. What if I miss a dose? If you miss a dose, take it as soon as you can. If it is almost time for your next dose, take only that dose. Do not take double or extra doses. What may interact with this medicine? This medicine may interact with the following medications:  aspirin and aspirin-like medicines  beta-blockers like metoprolol and  propranolol  certain medicines that treat or prevent blood clots like warfarin, enoxaparin, dalteparin, apixaban, dabigatran, and rivaroxaban  diuretics  female hormones, like estrogens and birth control pills This list may not describe all possible interactions. Give your health care provider a list of all the medicines, herbs, non-prescription drugs, or dietary supplements you use. Also tell them if you smoke, drink alcohol, or use illegal drugs. Some items may interact with your medicine. What should I watch for while using this medicine? You may need blood work done while you are taking this medicine. Follow a good diet and exercise plan. Taking this medicine  does not replace a healthy lifestyle. Some foods that have omega-3 fatty acids naturally are fatty fish like albacore tuna, halibut, herring, mackerel, lake trout, salmon, and sardines. If you are scheduled for any medical or dental procedure, tell your healthcare provider that you are taking this medicine. You may need to stop taking this medicine before the procedure. What side effects may I notice from receiving this medicine? Side effects that you should report to your doctor or health care professional as soon as possible:  allergic reactions like skin rash, itching or hives, swelling of the face, lips, or tongue  breathing problems  unusual bleeding or bruising  fast, irregular heartbeat Side effects that usually do not require medical attention (report to your doctor or health care professional if they continue or are bothersome):  muscle or joint pain  sore throat  swelling of the ankles, feet, hands  constipation  Gout This list may not describe all possible side effects. Call your doctor for medical advice about side effects. You may report side effects to FDA at 1-800-FDA-1088. Where should I keep my medicine? Keep out of the reach of children. Store at room temperature between 15 and 30 degrees C (59 and 86  degrees F). Throw away any unused medicine after the expiration date. NOTE: This sheet is a summary. It may not cover all possible information. If you have questions about this medicine, talk to your doctor, pharmacist, or health care provider.  2021 Elsevier/Gold Standard (2018-03-15 18:35:46)

## 2020-08-20 ENCOUNTER — Telehealth: Payer: Self-pay

## 2020-08-20 ENCOUNTER — Telehealth: Payer: Self-pay | Admitting: *Deleted

## 2020-08-20 ENCOUNTER — Other Ambulatory Visit
Admission: RE | Admit: 2020-08-20 | Discharge: 2020-08-20 | Disposition: A | Discharge status: Home / Self Care | Payer: Medicare HMO | Source: Ambulatory Visit | Attending: Physician Assistant | Admitting: Physician Assistant

## 2020-08-20 DIAGNOSIS — I2510 Atherosclerotic heart disease of native coronary artery without angina pectoris: Secondary | ICD-10-CM | POA: Insufficient documentation

## 2020-08-20 DIAGNOSIS — I10 Essential (primary) hypertension: Secondary | ICD-10-CM | POA: Diagnosis present

## 2020-08-20 LAB — BASIC METABOLIC PANEL
Anion gap: 9 (ref 5–15)
BUN: 20 mg/dL (ref 8–23)
CO2: 25 mmol/L (ref 22–32)
Calcium: 9.2 mg/dL (ref 8.9–10.3)
Chloride: 106 mmol/L (ref 98–111)
Creatinine, Ser: 0.67 mg/dL (ref 0.44–1.00)
GFR, Estimated: >60 mL/min (ref >60)
Glucose, Bld: 101 mg/dL — ABNORMAL HIGH (ref 70–99)
Potassium: 4.3 mmol/L (ref 3.5–5.1)
Sodium: 140 mmol/L (ref 135–145)

## 2020-08-20 NOTE — Telephone Encounter (Signed)
Error

## 2020-08-20 NOTE — Telephone Encounter (Signed)
Ordered by Marrianne Mood, PA.

## 2020-08-20 NOTE — Telephone Encounter (Signed)
Attempted to call the patient on her cell #. No answer- unable to leave a message at this number due to no voice mail set up.  Attempted to call the home #. No answer- I left a message to please call back.

## 2020-08-20 NOTE — Telephone Encounter (Signed)
-----   Message from Chapman Moss, RN sent at 08/20/2020  1:46 PM EDT ----- Regarding: FW: WatchPat Per [employer] PA is not required.  Please contact patient to proceed with sleep study ----- Message ----- From: Emily Filbert, RN Sent: 08/16/2020   5:29 PM EDT To: Emily Filbert, RN, [facility] Sleep Studies Subject: WatchPat                                       Order placed for WatchPat study per Marrianne Mood Dx- sleep disordered breathing

## 2020-08-20 NOTE — Telephone Encounter (Signed)
Per [place] PA is not required (Ref# [id number]).  Message to [facility] triage requesting they contact patient to proceed with sleep study.

## 2020-08-21 NOTE — Telephone Encounter (Signed)
I spoke with the patient. I advised her that she is ok to proceed with her home sleep study. I have provided her with the PIN #.  The patient was seen in clinic late on 08/15/20. She was given her WatchPat box, but I have advised her I forgot to write down the serial # for this.  I have asked if she will please call me back with that prior to doing her actual test so I can register in [employer].  The patient voices understanding and is agreeable.

## 2020-08-23 NOTE — Telephone Encounter (Signed)
Spoke with patient and she is currently at work. She does not have box with her to provide with serial number. Advised we need that number in order to register so she can do her test. She gets off at 4:30 pm and will give [place] a call with that serial number once she gets home. She had no further questions at this time.

## 2020-08-23 NOTE — Telephone Encounter (Signed)
Left voicemail message that device has been registered and to please call if she should have any further issues.

## 2020-08-23 NOTE — Telephone Encounter (Signed)
Patient calling back with the serial number   [id number]

## 2020-08-29 ENCOUNTER — Ambulatory Visit: Payer: Medicare HMO | Admitting: Dermatology

## 2020-09-09 ENCOUNTER — Encounter (INDEPENDENT_AMBULATORY_CARE_PROVIDER_SITE_OTHER): Payer: Medicare HMO | Admitting: Cardiology

## 2020-09-09 DIAGNOSIS — G4733 Obstructive sleep apnea (adult) (pediatric): Secondary | ICD-10-CM

## 2020-09-09 NOTE — Telephone Encounter (Signed)
Patient returning call back.  She has attempted to do study but doesn't know the code needed.

## 2020-09-09 NOTE — Telephone Encounter (Signed)
Spoke to pt. She did not recall receiving PIN number.  PIN given again over phone. Confirmed pt is registered.  Reviewed instructions again with pt.  She will also review video on app and will call with any further questions.  Pt states she will begin study this evening.

## 2020-09-09 NOTE — Telephone Encounter (Signed)
Attempted to call pt to follow up with [place] home sleep study.  No answer. Lmtcb.

## 2020-09-10 NOTE — Telephone Encounter (Signed)
Attempted to call the patient. No answer- I left a message to please call back.  

## 2020-09-10 NOTE — Telephone Encounter (Signed)
Patient calling, has a question in regards to sleep study she did last night - believes she must it up on accident  Please call to discuss

## 2020-09-15 ENCOUNTER — Ambulatory Visit: Payer: Medicare HMO

## 2020-09-15 DIAGNOSIS — G4730 Sleep apnea, unspecified: Secondary | ICD-10-CM

## 2020-09-15 NOTE — Procedures (Addendum)
   Sleep Study Report   Patient Information Study Date: Sep 09, 2020 Patient Name: Judy Prince Patient ID: [id number] Birth Date: [birth year] Age: 65 Gender:Female BMI: 32.0 (W=170 lb, H=5' 1'') Referring Physician: Marrianne Mood, PA  TEST DESCRIPTION: Home sleep apnea testing was completed using the WatchPat, a Type 1 device, utilizing peripheral arterial tonometry (PAT), chest movement, actigraphy, pulse oximetry, pulse rate, body position and snore. AHI was calculated with apnea and hypopnea using valid sleep time as the denominator. RDI includes apneas, hypopneas, and RERAs. The data acquired and the scoring of sleep and all associated events were performed in accordance with the recommended standards and specifications as outlined in the AASM Manual for the Scoring of Sleep and Associated Events 2.2.0 (2015).  FINDINGS: 1. Moderate Obstructive Sleep Apnea with AHI 17/hr. 2. No significant Central apneas with pAHIc 2.8/hr. 3. Oxygen desaturations as low as 88%. 4. Mild to moderate snoring was present. O2 sats were < 88% for 0.1 min. 5. Total sleep time was 5 hrs and 22 min. 6. 29.8% of total sleep time was spent in REM sleep. 7. Normal sleep onset latency at 10 min. 8. Normal REM sleep onset latency at 70 min. 9. Total awakenings were 10.  DIAGNOSIS: Moderate Obstructive Sleep Apnea (G47.33)  RECOMMENDATIONS: 1. Clinical correlation of these findings is necessary. The decision to treat obstructive sleep apnea (OSA) is usually based on the presence of apnea symptoms or the presence of associated medical conditions such as Hypertension, Congestive Heart Failure, Atrial Fibrillation or Obesity. The most common symptoms of OSA are snoring, gasping for breath while sleeping, daytime sleepiness and fatigue.  2. Initiating apnea therapy is recommended given the presence of symptoms and/or associated conditions. Recommend proceeding with one of the following:   a.  Auto-CPAP therapy with a pressure range of 5-20cm H2O.   b. An oral appliance (OA) that can be obtained from certain dentists with expertise in sleep medicine. These are primarily of use in non-obese patients with mild and moderate disease.   c. An ENT consultation which may be useful to look for specific causes of obstruction and possible treatment options.   d. If patient is intolerant to PAP therapy, consider referral to ENT for evaluation for hypoglossal nerve stimulator.  3. Close follow-up is necessary to ensure success with CPAP or oral appliance therapy for maximum benefit .  4. A follow-up oximetry study on CPAP is recommended to assess the adequacy of therapy and determine the need for supplemental oxygen or the potential need for Bi-level therapy. An arterial blood gas to determine the adequacy of baseline ventilation and oxygenation should also be considered.  5. Healthy sleep recommendations include: adequate nightly sleep (normal 7-9 hrs/night), avoidance of caffeine after noon and alcohol near bedtime, and maintaining a sleep environment that is cool, dark and quiet.  6. Weight loss for overweight patients is recommended. Even modest amounts of weight loss can significantly improve the severity of sleep apnea.  7. Snoring recommendations include: weight loss where appropriate, side sleeping, and avoidance of alcohol before bed.  8. Operation of motor vehicle or dangerous equipment must be avoided when feeling drowsy, excessively sleepy, or mentally fatigued.  Signature: Electronically Signed: Sep 15, 2020 Fransico Him, MD; [facility]; [place], American Board of Sleep Medicine Report prepared by: Fransico Him, MD

## 2020-09-16 ENCOUNTER — Ambulatory Visit: Payer: Medicare HMO | Admitting: Physician Assistant

## 2020-09-16 NOTE — Progress Notes (Deleted)
Office Visit    Patient Name: Judy Prince Date of Encounter: 09/16/2020  PCP:  Rusty Aus, MD   [facility]  Cardiologist:  Kathlyn Sacramento, MD  Advanced Practice Provider:  No care team member to display Electrophysiologist:  None :[id number]}   Chief Complaint    No chief complaint on file.   65 year old female with history of coronary artery disease, hypertension, hyperlipidemia, and here today for 1 month follow-up of CAD.  Past Medical History    Past Medical History:  Diagnosis Date   Arthritis    Coronary artery disease    GERD (gastroesophageal reflux disease)    History of kidney stones    Hypertension    Osteoporosis    Past Surgical History:  Procedure Laterality Date   ABDOMINAL HYSTERECTOMY     BREAST BIOPSY Right 03/15/2017   PSEUDO-ANGIOMATOUS STROMAL HYPERPLASIA.    CHOLECYSTECTOMY     COLONOSCOPY     CORONARY ANGIOPLASTY WITH STENT PLACEMENT  2007   3 stents   ESOPHAGOGASTRODUODENOSCOPY     FINGER ARTHRODESIS Left 03/26/2016   Procedure: ARTHRODESIS FINGER;  Surgeon: Hessie Knows, MD;  Location: [facility] ORS;  Service: Orthopedics;  Laterality: Left;   TUBAL LIGATION      Allergies  No Known Allergies  History of Present Illness    Judy Prince is a 65 y.o. female with PMH as above.   She has history of CAD.  In the past, she is followed by Dr. Saralyn Pilar.  She reported anginal symptoms in 2007 with subsequent cardiac catheterization showing 70% mid RCA stenosis with moderate tortuosity in the vessel.  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  After that time, she has reportedly done well without recurrent cardiac events on DAPT with ASA and clopidogrel.  She was seen 07/08/2020 and noted occasional left-sided chest pain, attributed to MSK etiology.  Pain improved with Voltaren gel.  It was described as different from before her  catheterization, as it occurred on the left side, rather than in the center of her chest.  The pain before her catheterization was also a pressure, rather than soreness.  She felt the pain when eating, sitting, working.  It was not clearly associated with exertion.  She reported pain in her shoulders and knees.  She also noted amaurosis fugax with carotid study ordered.  BP elevated, though she had not taken her blood pressure medication.  She was not checking her blood pressure at home.  She reported that she did not sleep well and occasionally snored with watch pat discussed and patient preference to defer watch pat at that time.  Recommendation was to repeat echo.  08/06/2020 MPI performed and ruled a normal study/low risk study with hyperdynamic LVEF greater than 65% and no evidence of ischemia.  08/08/2020 echo showed EF 60 to 65%, NR WMA, G1 DD, mild LAE, trivial MR, mild TR.    08/08/2020 carotids performed and without evidence of thrombus, dissection, or plaque.  08/12/2020, she called the office with BP 187/77 prior to medication readings.  With his elevated pressure, she reported feeling anxious and unwell with a headache.  HCTZ had reportedly been held for low potassium.  Per phone note, she had still taken the HCTZ due to elevated pressure for a couple of days, though this did not help her.  She noted recently receiving a cortisone shot.  -------------------  Today, 08/15/2020, she returns  to clinic and notes she is doing about the same as previous clinic visits.  She continues to have left-sided MSK like pain as described above and ever since her catheterization/stenting.  Pain is described as tender to palpation and a soreness. She has been working in the yard and picking up rocks recently, and she noticed that she became short of breath and tired more easily than in the past.  She usually recovers after sitting down for a while.  No shortness of breath at rest today.  She denies any significant  symptoms of volume overload.  Echo reviewed as above with recommendation for BP control.  BP today suboptimal at 160/88 with HR 78 bpm.  She notes she was taking HCTZ for couple of days but has since discontinued this medication as above and due to low potassium.  She continues to note knee/MSK/arthritic pain as above, exacerbated by her sedentary job and repetitive motions it requires, and wonders how much this contributes to her elevated BP.  Also noted was cortisone shot, though it has been some time since receipt of this injection.  She will retire in October 2022, which is 25 years at that time.  She does not drink a lot of caffeine, usually only drinking half of her cup.  She does continue to note that she snores and feels fatigued throughout the day.  She is agreeable to watch Pat after thinking about it further between visits.  Medication changes: Discontinue HCTZ (discontinued between visits due to hypokalemia), start losartan 25 mg daily.  Start Vascepa 2 g twice daily. Labs ordered: BMET, repeat BMET at RTC Studies / Imaging ordered: Watch Pat home sleep study Future considerations: Could consider spironolactone Disposition: RTC 1 month with BMET at that time   Home Medications    Current Outpatient Medications on File Prior to Visit  Medication Sig Dispense Refill   acetaminophen (TYLENOL) 500 MG tablet Take 1,000 mg by mouth every 6 (six) hours as needed for moderate pain or headache.     aspirin EC 81 MG tablet Take 81 mg by mouth daily.      Calcium Citrate-Vitamin D (CALCIUM + D PO) Take 1 tablet by mouth daily.     clopidogrel (PLAVIX) 75 MG tablet Take 75 mg by mouth daily.      ezetimibe (ZETIA) 10 MG tablet Take 1 tablet (10 mg total) by mouth daily. 90 tablet 3   famotidine (PEPCID) 20 MG tablet Take 40 mg by mouth at bedtime.     fluticasone (FLONASE) 50 MCG/ACT nasal spray USE 2 SPRAYS IN EACH NOSTRIL DAILY AS NEEDED FOR ALLERGIES     icosapent Ethyl (VASCEPA) 1 g capsule  Take 2 capsules (2 g total) by mouth 2 (two) times daily. 120 capsule 6   loratadine (CLARITIN) 10 MG tablet Take 10 mg by mouth daily at 12 noon.     losartan (COZAAR) 25 MG tablet Take 1 tablet (25 mg total) by mouth daily. 30 tablet 6   metoprolol (LOPRESSOR) 50 MG tablet Take 50 mg by mouth 2 (two) times daily.      pantoprazole (PROTONIX) 40 MG tablet Take 80ms daily may take an additional 468m as needed for indigestion     rosuvastatin (CRESTOR) 20 MG tablet Take 20 mg by mouth every evening.      sertraline (ZOLOFT) 50 MG tablet Take 50 mg by mouth daily before lunch.   12   No current facility-administered medications on file prior to visit.  Review of Systems    She has fatigue and dyspnea with moving rocks, resolving with a few minutes rest.  She reports left-sided chest pain without clear triggers and attributed to MSK etiology, improving with Voltaren.  She reports daytime fatigue and poor sleep with snoring.  She reports pain in her shoulders and knee.  She has a headache and poor feeling whenever her blood pressure is elevated.  She denies palpitations, pnd, orthopnea, n, v, dizziness, syncope, edema, weight gain, or early satiety.  All other systems reviewed and are otherwise negative except as noted above.  Physical Exam    VS:  LMP  (LMP Unknown)  , BMI There is no height or weight on file to calculate BMI. GEN: Well nourished, well developed, in no acute distress.  Facemask in place. HEENT: normal. Neck: Supple, no JVD, carotid bruits, or masses. Cardiac: RRR, 1/6 systolic murmur at 5th ICS. No rubs or gallops. No clubbing, cyanosis, or pitting edema.  Radials/DP/PT 2+ and equal bilaterally.  Respiratory:  Respirations regular and unlabored, clear to auscultation bilaterally. GI: Soft, nontender, nondistended, BS + x 4. MS: no deformity or atrophy. Skin: warm and dry, no rash. Neuro:  Strength and sensation are intact. Psych: Normal affect.  Accessory Clinical  Findings    ECG personally reviewed by me today -NSR,78 bpm- no acute changes.  VITALS Reviewed today   Temp Readings from Last 3 Encounters:  11/20/19 98.4 F (36.9 C) (Oral)  03/26/16 97.7 F (36.5 C)  05/15/15 98.4 F (36.9 C) (Oral)   BP Readings from Last 3 Encounters:  08/15/20 (!) 160/88  07/08/20 (!) 148/78  12/07/19 (!) 170/80   Pulse Readings from Last 3 Encounters:  08/15/20 78  12/07/19 66  11/20/19 76    Wt Readings from Last 3 Encounters:  08/15/20 169 lb (76.7 kg)  07/08/20 175 lb (79.4 kg)  12/07/19 168 lb 6 oz (76.4 kg)     LABS  reviewed today   Lab Results  Component Value Date   WBC 6.5 03/20/2016   HGB 14.6 03/26/2016   HCT 43.0 03/26/2016   MCV 87.9 03/20/2016   PLT 259 03/20/2016   Lab Results  Component Value Date   CREATININE 0.67 08/20/2020   BUN 20 08/20/2020   NA 140 08/20/2020   K 4.3 08/20/2020   CL 106 08/20/2020   CO2 25 08/20/2020   No results found for: ALT, AST, GGT, ALKPHOS, BILITOT No results found for: CHOL, HDL, LDLCALC, LDLDIRECT, TRIG, CHOLHDL  No results found for: HGBA1C No results found for: TSH     Care everywhere labs: (06/2020) Total cholesterol 173, triglycerides 317, HDL 47.5, LDL 62 Glucose 97, sodium 141, potassium 4.0, CO2 28.3, creatinine 0.7, BUN 14, calcium 9.4, AST 16, ALT 16 alk phos 73, albumin 4.1 CBC WBC 7.9, RBC 4.71, hemoglobin 14.0, hematocrit 43.4 TSH 3.7  STUDIES/PROCEDURES reviewed today   Echo 08/08/20  1. Left ventricular ejection fraction, by estimation, is 60 to 65%. The  left ventricle has normal function. The left ventricle has no regional  wall motion abnormalities. Left ventricular diastolic parameters are  consistent with Grade I diastolic  dysfunction (impaired relaxation).   2. Right ventricular systolic function is normal. The right ventricular  size is normal.   3. Left atrial size was mildly dilated.   4. The mitral valve is normal in structure. Trivial mitral valve   regurgitation.  Mild tricuspid regurgitation.  MPI 08/06/2020 There was no ST segment deviation noted during stress.  The study is normal. This is a low risk study. The left ventricular ejection fraction is hyperdynamic (>65%). There is no evidence for ischemia  Carotids 5/ 02/2021 Summary:  Right Carotid: There was no evidence of thrombus, dissection,  atherosclerotic                 plaque or stenosis in the cervical carotid system.   Left Carotid: There was no evidence of thrombus, dissection,  atherosclerotic                plaque or stenosis in the cervical carotid system.   Vertebrals:  Bilateral vertebral arteries demonstrate antegrade flow.  Subclavians: Normal flow hemodynamics were seen in bilateral subclavian               arteries.   2018  Stress and echo stress (cannot access most of this data) INTERPRETATION  Normal Stress Echocardiogram  NORMAL RIGHT VENTRICULAR SYSTOLIC FUNCTION  MILD VALVULAR REGURGITATION (See above)  Trivial MR, mild TR NO VALVULAR STENOSIS NOTED  Stress Duration:2:29 mm:ss    Max Stress H.R.:142 bpm        Target Heart Rate Achieved: Yes  Maximum workload of 8.90 METS was achieved during exercise    MPI 02/2009 1.     Normal left ventricular function.  2.     Normal wall motion.  3.     Normal Sestamibi scintigraphy without evidence of scar or ischemia.    2007 cath (not in EMR)  06/2019  LE arterial study Summary:  Right: Resting right ankle-brachial index is within normal range.  No evidence of significant right lower extremity arterial disease.  Left: Resting left ankle-brachial index is within normal range.  No evidence of significant left lower extremity arterial disease. The left  toe-brachial index is normal.   STOP-BANG Rex assessment  S-snore  Have you been told that you snore?   1  T-tired Are you often tired, fatigued, sleepy during the day?   1  O-obstruction Do you stop breathing, choke, or gasp during  sleep?   0  P-pressure Do you have or are you being treated for high blood pressure?  1  B- BMI Is your BMI greater than 35 kg/m?  0  A- age Are you 61 years or older?  1  N-neck Do you have a neck circumference greater than 16 inches?  0  G-gender Are you female?   0  Total  4     Assessment & Plan    Coronary artery disease Chest pain, suspect MSK --Reports ongoing CP as described in HPI and consistent with MSK etiology, described as a soreness that is not similar to before her catheterization.  She does report this pain has existed since her stenting and is without clear triggers.  Given her history of CAD, echo ordered with nl EF, NRWMA, no acute structural/valvular changes.  MPI low risk and without evidence of ischemia. Ongoing RF modification discussed, including BP and cholesterol control. Diet and activity reviewed. Continue current medications, including indefinite DAPT, BB, and statin.  Of note, most recent care everywhere labs transcribed under CV studies as above.  HLD, LDL goal below 70 Hypertriglyceridemia --06/2020 labs reviewed with total cholesterol 173, triglycerides 317, HDL 47.5, LDL 62.Marland Kitchen  LDL is at goal of below 70.  She continues on Crestor 20 mg daily and Zetia 10 mg daily.  Given her ongoing elevated triglycerides, will add Vascepa 2 g twice daily to her current medications..Recommend ongoing heart healthy  diet and increased activity as tolerated.    Essential hypertension, goal BP <130/80 --BP today suboptimal at 160/88 with consideration that she is not taking her HCTZ due to recent hypokalemia.  Will add losartan 25 mg daily.  Reassess at RTC.    Amaurosis fugax -- No significant disease noted on recent carotids as above..  Sleep disordered breathing/snoring -- She continues to note ongoing snoring/fatigue.  Agreeable to watch Pat today with STOP-BANG performed as above in HPI.  We discussed how treatment of sleep apnea helps control BP and overall is  recommended from a cardiovascular standpoint.   Medication changes: Discontinue HCTZ (discontinued between visits due to hypokalemia), start losartan 25 mg daily.  Start Vascepa 2 g twice daily. Labs ordered: BMET, repeat BMET at RTC Studies / Imaging ordered: Watch Pat home sleep study Future considerations: Could consider spironolactone Disposition: RTC 1 month with BMET at that time    Arvil Chaco, PA-C 09/16/2020

## 2020-09-16 NOTE — Telephone Encounter (Signed)
Spoke with patient. She reports completing study. She did want me to update provider on one of her medications. The fish oil medication is going to cost her $100 a month and she is just not able to afford this. She inquired if there is something over the counter for this. Advised I would send message to provider for her review and would be in touch with any recommendations. She was appreciative for the call with no further questions at this time.

## 2020-09-17 ENCOUNTER — Telehealth: Payer: Self-pay | Admitting: *Deleted

## 2020-09-17 ENCOUNTER — Encounter: Payer: Self-pay | Admitting: *Deleted

## 2020-09-17 DIAGNOSIS — G4733 Obstructive sleep apnea (adult) (pediatric): Secondary | ICD-10-CM

## 2020-09-17 NOTE — Telephone Encounter (Signed)
-----   Message from Sueanne Margarita, MD sent at 09/15/2020  3:40 PM EDT ----- Please let patient know that they have sleep apnea and recommend treating with CPAP.  Please order an auto CPAP from 4-15cm H2O with heated humidity and mask of choice.  Order overnight pulse ox on CPAP.  Followup with me in 6 weeks.

## 2020-09-17 NOTE — Addendum Note (Signed)
Addended by: Freada Bergeron on: 09/17/2020 02:13 PM   Modules accepted: Orders

## 2020-09-17 NOTE — Telephone Encounter (Signed)
The patient has been notified of the result and verbalized understanding.  All questions (if any) were answered. Marolyn Hammock, [place] 09/17/2020 1:50 PM     Upon patient request DME selection is [facility] Patient understands he will be contacted by [facility] to set up his cpap. Patient understands to call if [facility] does not contact him with new setup in a timely manner. Patient understands they will be called once confirmation has been received from [facility] that they have received their new machine to schedule 10 week follow up appointment.   [facility] notified of new cpap order  Please add to airview Patient was grateful for the call and thanked me

## 2020-09-17 NOTE — Telephone Encounter (Signed)
This encounter was created in error - please disregard.

## 2020-09-22 ENCOUNTER — Telehealth: Payer: Self-pay | Admitting: Physician Assistant

## 2020-09-22 NOTE — Telephone Encounter (Signed)
Spoke with patient regarding Vascepa, as she is unable to afford this medication.  We discussed fenofibrate versus increased dose Crestor with patient preference at this time to try diet and lifestyle changes.  She has recently completed the watch pat with recommendation for CPAP/sleep apnea treatment.  We discussed that this will likely improve her energy and motivation, which will likely help with working towards dietary changes and increasing activity.  We discussed increasing fiber and protein in her diet, as well as increasing activity as tolerated.  I let her know that we can send some heart healthy recommendations via MyChart.  She currently does not have access to MyChart with code sent via text message today.  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  We will recheck triglycerides following her lifestyle changes, and if still elevated at that time, revisit medication options to better control triglycerides at that time.  Removing Vascepa from her medication list.

## 2020-09-25 NOTE — Telephone Encounter (Signed)
Spoke with patient and she confirmed talking with JV regarding lifestyle changes. She had no further questions at this time.

## 2020-09-25 NOTE — Telephone Encounter (Signed)
Left voicemail message to call back  

## 2020-10-24 ENCOUNTER — Telehealth: Payer: Self-pay | Admitting: Physician Assistant

## 2020-10-24 ENCOUNTER — Telehealth: Payer: Self-pay | Admitting: Cardiovascular Disease

## 2020-10-24 NOTE — Telephone Encounter (Signed)
Patient calling to check approval status of sleep machine ordered by Mickle Plumb

## 2020-10-24 NOTE — Telephone Encounter (Signed)
Spoke with the patient. Patient is inquiring on the status of her cpap machine. Provided her with the sleep coordinator Gershon Cull, CMA telephone number.  Patient voiced appreciation for the assistance.

## 2020-10-24 NOTE — Telephone Encounter (Signed)
error 

## 2021-01-14 ENCOUNTER — Telehealth: Payer: Self-pay | Admitting: Cardiovascular Disease

## 2021-01-14 ENCOUNTER — Other Ambulatory Visit: Payer: Self-pay | Admitting: Orthopedic Surgery

## 2021-01-14 NOTE — Telephone Encounter (Signed)
Patient calling  States she has questions in regards to her sleep machine that we were to set her up with  Please call to discuss

## 2021-01-22 ENCOUNTER — Telehealth: Payer: Self-pay | Admitting: *Deleted

## 2021-01-22 ENCOUNTER — Other Ambulatory Visit: Payer: Self-pay

## 2021-01-22 ENCOUNTER — Encounter
Admission: RE | Admit: 2021-01-22 | Discharge: 2021-01-22 | Disposition: A | Discharge status: Home / Self Care | Payer: Medicare HMO | Source: Ambulatory Visit | Attending: Orthopedic Surgery | Admitting: Orthopedic Surgery

## 2021-01-22 VITALS — BP 160/73 | HR 59 | Resp 18 | Ht 61.0 in | Wt 173.0 lb

## 2021-01-22 DIAGNOSIS — Z01818 Encounter for other preprocedural examination: Secondary | ICD-10-CM | POA: Diagnosis not present

## 2021-01-22 DIAGNOSIS — Z01812 Encounter for preprocedural laboratory examination: Secondary | ICD-10-CM

## 2021-01-22 DIAGNOSIS — Z01810 Encounter for preprocedural cardiovascular examination: Secondary | ICD-10-CM | POA: Diagnosis not present

## 2021-01-22 HISTORY — DX: Anxiety disorder, unspecified: F41.9

## 2021-01-22 HISTORY — DX: Basal cell carcinoma of skin of unspecified upper limb, including shoulder: C44.611

## 2021-01-22 HISTORY — DX: Lichen planus, unspecified: L43.9

## 2021-01-22 HISTORY — DX: Pten hamartoma tumor syndrome: Q85.81

## 2021-01-22 HISTORY — DX: Sleep apnea, unspecified: G47.30

## 2021-01-22 LAB — URINALYSIS, ROUTINE W REFLEX MICROSCOPIC
Bacteria, UA: NONE SEEN
Bilirubin Urine: NEGATIVE
Glucose, UA: NEGATIVE mg/dL
Ketones, ur: NEGATIVE mg/dL
Nitrite: NEGATIVE
Protein, ur: NEGATIVE mg/dL
Specific Gravity, Urine: 1.009 (ref 1.005–1.030)
pH: 5 (ref 5.0–8.0)

## 2021-01-22 LAB — CBC WITH DIFFERENTIAL/PLATELET
Abs Immature Granulocytes: 0.02 10*3/uL (ref 0.00–0.07)
Basophils Absolute: 0 10*3/uL (ref 0.0–0.1)
Basophils Relative: 1 %
Eosinophils Absolute: 0.2 10*3/uL (ref 0.0–0.5)
Eosinophils Relative: 4 %
HCT: 41.1 % (ref 36.0–46.0)
Hemoglobin: 13.8 g/dL (ref 12.0–15.0)
Immature Granulocytes: 0 %
Lymphocytes Relative: 41 %
Lymphs Abs: 2.6 10*3/uL (ref 0.7–4.0)
MCH: 30.7 pg (ref 26.0–34.0)
MCHC: 33.6 g/dL (ref 30.0–36.0)
MCV: 91.5 fL (ref 80.0–100.0)
Monocytes Absolute: 0.7 10*3/uL (ref 0.1–1.0)
Monocytes Relative: 12 %
Neutro Abs: 2.6 10*3/uL (ref 1.7–7.7)
Neutrophils Relative %: 42 %
Platelets: 253 10*3/uL (ref 150–400)
RBC: 4.49 MIL/uL (ref 3.87–5.11)
RDW: 11.9 % (ref 11.5–15.5)
WBC: 6.2 10*3/uL (ref 4.0–10.5)
nRBC: 0 % (ref 0.0–0.2)

## 2021-01-22 LAB — COMPREHENSIVE METABOLIC PANEL
ALT: 17 U/L (ref 0–44)
AST: 18 U/L (ref 15–41)
Albumin: 3.8 g/dL (ref 3.5–5.0)
Alkaline Phosphatase: 57 U/L (ref 38–126)
Anion gap: 7 (ref 5–15)
BUN: 12 mg/dL (ref 8–23)
CO2: 27 mmol/L (ref 22–32)
Calcium: 9.2 mg/dL (ref 8.9–10.3)
Chloride: 107 mmol/L (ref 98–111)
Creatinine, Ser: 0.64 mg/dL (ref 0.44–1.00)
GFR, Estimated: >60 mL/min (ref >60)
Glucose, Bld: 97 mg/dL (ref 70–99)
Potassium: 3.8 mmol/L (ref 3.5–5.1)
Sodium: 141 mmol/L (ref 135–145)
Total Bilirubin: 0.7 mg/dL (ref 0.3–1.2)
Total Protein: 7.4 g/dL (ref 6.5–8.1)

## 2021-01-22 LAB — TYPE AND SCREEN
ABO/RH(D): A NEG
Antibody Screen: NEGATIVE

## 2021-01-22 LAB — SURGICAL PCR SCREEN
MRSA, PCR: NEGATIVE
Staphylococcus aureus: NEGATIVE

## 2021-01-22 NOTE — Patient Instructions (Addendum)
Your procedure is scheduled on: 02/04/21 Report to [facility]. To find out your arrival time please call [phone] between 1PM - 3PM on 02/03/21.  Remember: Instructions that are not followed completely may result in serious medical risk, up to and including death, or upon the discretion of your surgeon and anesthesiologist your surgery may need to be rescheduled.     _X__ 1. Do not eat food after midnight the night before your procedure.                 No gum chewing or hard candies. You may drink clear liquids up to 2 hours                 before you are scheduled to arrive for your surgery- DO not drink clear                 liquids within 2 hours of the start of your surgery.                 Clear Liquids include:  water, apple juice without pulp, clear carbohydrate                 drink such as Clearfast or Gatorade, Black Coffee or Tea (Do not add                 anything to coffee or tea). Diabetics water only  FINISH ENSURE "CLEAR" PRE SURGERY DRINK 2 HOURS BEFORE ARRIVAL TO SURGERY  __X__2.  On the morning of surgery brush your teeth with toothpaste and water, you                 may rinse your mouth with mouthwash if you wish.  Do not swallow any              toothpaste of mouthwash.     _X__ 3.  No Alcohol for 24 hours before or after surgery.   _X__ 4.  Do Not Smoke or use e-cigarettes For 24 Hours Prior to Your Surgery.                 Do not use any chewable tobacco products for at least 6 hours prior to                 surgery.  ____  5.  Bring all medications with you on the day of surgery if instructed.   __X__  6.  Notify your doctor if there is any change in your medical condition      (cold, fever, infections).     Do not wear jewelry, make-up, hairpins, clips or nail polish. Do not wear lotions, powders, or perfumes.  Do not shave body hair 48 hours prior to surgery. Men may shave face and neck. Do not  bring valuables to the hospital.    [facility] is not responsible for any belongings or valuables.  Contacts, dentures/partials or body piercings may not be worn into surgery. Bring a case for your contacts, glasses or hearing aids, a denture cup will be supplied. Leave your suitcase in the car. After surgery it may be brought to your room. For patients admitted to the hospital, discharge time is determined by your treatment team.   Patients discharged the day of surgery will not be allowed to drive home.   Please read over the following fact sheets that you were given:   MRSA Information, CHG  SOAP, INCENTIVE SPIROMETER, EMMI, ADVANCE DIRECTIVE  __X__ Take these medicines the morning of surgery with A SIP OF WATER:    1. ezetimibe (ZETIA) 10 MG tablet  2. metoprolol (LOPRESSOR) 50 MG tablet  3. pantoprazole (PROTONIX) 40 MG tablet  4. sertraline (ZOLOFT) 50 MG tablet  5.  6.  ____ Fleet Enema (as directed)   __X__ Use CHG Soap/SAGE wipes as directed  ____ Use inhalers on the day of surgery  ____ Stop metformin/Janumet/Farxiga 2 days prior to surgery    ____ Take 1/2 of usual insulin dose the night before surgery. No insulin the morning          of surgery.   __X__ Stop Blood Thinners Coumadin/Plavix/Xarelto/Pleta/Pradaxa/Eliquis/Effient/Aspirin  on   Or contact your Surgeon, Cardiologist or Medical Doctor regarding  ability to stop your blood thinners  __X__ Stop Anti-inflammatories 7 days before surgery such as Advil, Ibuprofen, Motrin,  BC or Goodies Powder, Naprosyn, Naproxen, Aleve, Aspirin    __X__ Stop all herbal supplements, fish oil or vitamin E until after surgery.  STOP B VITAMINS 1 WEEK BEFORE SURGERY    ____ Bring C-Pap to the hospital.    Contact Dr Fletcher Anon in regards to your ability to hold your Plavix and or Aspirin. We have reached out to him as well.

## 2021-01-22 NOTE — Telephone Encounter (Signed)
   Name: Judy Prince  DOB: 22-Mar-1956  MRN: [id number]  Primary Cardiologist: Judy Sacramento, MD  Chart reviewed as part of pre-operative protocol coverage. Because of Judy Prince's past medical history and time since last visit, she will require a follow-up visit in order to better assess preoperative cardiovascular risk. Last seen 07/2020 by Judy Mood PA-C at which time blood pressure was poorly controlled. Patient was also reporting some chest discomfort at that visit with relatively reassuring nuc and echo. Her blood pressure medications were adjusted with plan to titrate further depending on response. It was recommended at that time to f/u in 1 month but patient cancelled appointment and has not rescheduled. Per review of 12/25/20 OV at outside office as well as pre-admissions visit today, blood pressure was still not well controlled.  Pre-op covering staff: - Please schedule appointment and call patient to inform them. - Will cc to Judy Prince so pre-op team aware appointment is needed.  Will route this message to Dr. Fletcher Prince for input on holding ASA + Plavix as requested so that this information is available to provider at time of visit in our office. Dr. Fletcher Prince - Please route response to P CV DIV PREOP (the pre-op pool). Thank you. Per last OV note, "She reported anginal symptoms in 2007 with subsequent cardiac catheterization showing 70% mid RCA stenosis with moderate tortuosity in the vessel.  PCI was complicated by guide catheter induced spiral dissection, which required placement of 3 overlapping Cypher stents."I cannot find prior cath report.  Judy Pitter, PA-C  01/22/2021, 1:00 PM

## 2021-01-22 NOTE — Telephone Encounter (Signed)
Pt has been advised of needing pre op appt. Pt agreeable to plan of care and has been scheduled to see Ignacia Bayley, NP 01/23/21 @ 9:30 for pre op clearance. Pt grateful for the call and the appt. I will forward notes to NP for appt tomorrow. Will send FYI to surgeon's office pt has appt.

## 2021-01-22 NOTE — Telephone Encounter (Signed)
Request for pre-operative cardiac clearance Received: Today Karen Kitchens, NP  P Cv Div Preop Callback Request for pre-operative cardiac clearance:     1. What type of surgery is being performed?  LEFT TOTAL KNEE ARTHROPLASTY   2. When is this surgery scheduled?  02/04/2021     3. Are there any medications that need to be held prior to surgery?  ASA + CLOPIDOGREL   4. Practice name and name of physician performing surgery?  Performing surgeon: Dr. Hessie Knows, MD  Requesting clearance: Honor Loh, FNP-C       5. Anesthesia type (none, local, MAC, general)? General   6. What is the office phone and fax number?    Phone: [phone]  Fax: [phone]   ATTENTION: Unable to create telephone message as per your standard workflow. Directed by [facility] providers to send requests for cardiac clearance to this pool for appropriate distribution to provider covering pre-operative clearances.   Honor Loh, MSN, APRN, FNP-C, CEN  [facility]  Peri-operative Services Nurse Practitioner  Phone: [phone]  01/22/21 11:30 AM

## 2021-01-23 ENCOUNTER — Ambulatory Visit: Payer: Medicare HMO | Admitting: Nurse Practitioner

## 2021-01-23 ENCOUNTER — Encounter: Payer: Self-pay | Admitting: Nurse Practitioner

## 2021-01-23 VITALS — BP 132/78 | HR 64 | Ht 61.0 in | Wt 169.5 lb

## 2021-01-23 DIAGNOSIS — I2510 Atherosclerotic heart disease of native coronary artery without angina pectoris: Secondary | ICD-10-CM | POA: Diagnosis not present

## 2021-01-23 DIAGNOSIS — G4733 Obstructive sleep apnea (adult) (pediatric): Secondary | ICD-10-CM

## 2021-01-23 DIAGNOSIS — Z01810 Encounter for preprocedural cardiovascular examination: Secondary | ICD-10-CM | POA: Diagnosis not present

## 2021-01-23 DIAGNOSIS — E781 Pure hyperglyceridemia: Secondary | ICD-10-CM

## 2021-01-23 DIAGNOSIS — E785 Hyperlipidemia, unspecified: Secondary | ICD-10-CM | POA: Diagnosis not present

## 2021-01-23 DIAGNOSIS — I10 Essential (primary) hypertension: Secondary | ICD-10-CM

## 2021-01-23 NOTE — Patient Instructions (Signed)
Medication Instructions:  ?No changes at this time. ? ?*If you need a refill on your cardiac medications before your next appointment, please call your pharmacy* ? ? ?Lab Work: ?None ? ?If you have labs (blood work) drawn today and your tests are completely normal, you will receive your results only by: ?MyChart Message (if you have MyChart) OR ?A paper copy in the mail ?If you have any lab test that is abnormal or we need to change your treatment, we will call you to review the results. ? ? ?Testing/Procedures: ?None ? ? ?Follow-Up: ?At [facility], you and your health needs are our priority.  As part of our continuing mission to provide you with exceptional heart care, we have created designated Provider Care Teams.  These Care Teams include your primary Cardiologist (physician) and Advanced Practice Providers (APPs -  Physician Assistants and Nurse Practitioners) who all work together to provide you with the care you need, when you need it. ? ? ?Your next appointment:   ?3-4 month(s) ? ?The format for your next appointment:   ?In Person ? ?Provider:   ?Muhammad Arida, MD or [name], NP ?

## 2021-01-23 NOTE — Progress Notes (Signed)
Office Visit    Patient Name: Judy Prince Date of Encounter: 01/23/2021  Primary Care Provider:  Rusty Aus, MD Primary Cardiologist:  Kathlyn Sacramento, MD  Chief Complaint    65 year old female with a history of CAD, hypertension, hyperlipidemia, obesity, and sleep apnea, who presents for follow-up related to preoperative evaluation pending left total knee arthroplasty.  Past Medical History    Past Medical History:  Diagnosis Date   Anxiety    Arthritis    Basal cell carcinoma (BCC) of forearm    Coronary artery disease    a. 2007 Cath/PCI: RCA 1m - PCI complicated by spiral dissection req 3 Cypher DESs-->chronic DAPT; b. 07/2020 MV: EF>65%, No ischemia/infarct.   Cowden disease    Diastolic dysfunction    a. 07/2020 Echo: EF 60-65%, no rwma, GR1 DD, nl RV fxn. Mildly dil LA. Triv MR.   GERD (gastroesophageal reflux disease)    History of kidney stones    Hyperlipidemia LDL goal <70    Hypertension    Lichen planus    Morbid obesity ([place])    Osteoporosis    Sleep apnea    Past Surgical History:  Procedure Laterality Date   ABDOMINAL HYSTERECTOMY     BREAST BIOPSY Right 03/15/2017   PSEUDO-ANGIOMATOUS STROMAL HYPERPLASIA.    CHOLECYSTECTOMY     COLONOSCOPY     CORONARY ANGIOPLASTY WITH STENT PLACEMENT  2007   3 stents   ESOPHAGOGASTRODUODENOSCOPY     FINGER ARTHRODESIS Left 03/26/2016   Procedure: ARTHRODESIS FINGER;  Surgeon: Hessie Knows, MD;  Location: [facility] ORS;  Service: Orthopedics;  Laterality: Left;   SKIN CANCER EXCISION     TUBAL LIGATION      Allergies  Allergies  Allergen Reactions   Molds & Smuts    Other     History of Present Illness    Six 15-year-old female with the above past medical history including CAD, hypertension, hyperlipidemia, obesity, sleep apnea.  She underwent diagnostic catheterization in 2007 which revealed significant RCA stenosis.  PCI was performed and was complicated by spiral dissection requiring 3 Cypher  drug-eluting stents.  She has since been on chronic DAPT.  More recently, in April 2022, she reported occasional left-sided chest pain.  Stress testing was undertaken in May 2022 showing no evidence of ischemia or infarct with normal LV function.  Echocardiogram May 2022 showed an EF of 60 to 59%, grade 1 diastolic dysfunction, trivial MR, and mild TR.  Carotid Dopplers in May 2022 performed in the setting of reported visual floaters, showed no evidence of disease.  Judy Prince was last seen in cardiology clinic in May 2022 at which time she continued report musculoskeletal chest pain and soreness.  In the setting of hypertriglyceridemia, Vascepa 2 g twice daily was added however patient was not able to afford.  Losartan 25 mg daily was also added in the setting of ongoing hypertension.  She was referred for a sleep study which was subsequently performed in June and showed moderate obstructive sleep apnea.  She was subsequently placed on CPAP.  Since her last visit, Judy Prince has done well.  She occasionally notes left upper chest wall discomfort and soreness that is worse with palpation.  She notes chronic upper and lower extremity joint aches, which she questions whether or not these might be coming from statin therapy.  Judy Prince is now pending left total knee arthroplasty and presents today for preoperative evaluation.  Surgery is scheduled for November 8.  She  continues to work full-time but notes that she is sedentary.  She does not have any problem navigating supermarkets and notes that she presented for preop appointments yesterday and walked quite a bit without chest pain or dyspnea.  She denies palpitations, PND, orthopnea, dizziness, syncope, edema, or early satiety.  Home Medications    Current Outpatient Medications  Medication Sig Dispense Refill   acetaminophen (TYLENOL) 500 MG tablet Take 1,000 mg by mouth every 6 (six) hours as needed for moderate pain or headache.      amoxicillin-clavulanate (AUGMENTIN) 875-125 MG tablet Take 1 tablet by mouth 2 (two) times daily.     aspirin EC 81 MG tablet Take 81 mg by mouth daily.      b complex vitamins capsule Take 1 capsule by mouth daily.     clopidogrel (PLAVIX) 75 MG tablet Take 75 mg by mouth daily.      doxycycline (MONODOX) 50 MG capsule Take 50 mg by mouth daily.     ezetimibe (ZETIA) 10 MG tablet Take 1 tablet (10 mg total) by mouth daily. 90 tablet 3   famotidine (PEPCID) 20 MG tablet Take 40 mg by mouth at bedtime.     fluticasone (FLONASE) 50 MCG/ACT nasal spray Place 2 sprays into both nostrils daily.     losartan (COZAAR) 25 MG tablet Take 1 tablet (25 mg total) by mouth daily. 30 tablet 6   Menthol, Topical Analgesic, (BIOFREEZE EX) Apply 1 application topically daily as needed (joint pain).     metoprolol (LOPRESSOR) 50 MG tablet Take 50 mg by mouth 2 (two) times daily.      pantoprazole (PROTONIX) 40 MG tablet Take 40 mg by mouth daily.     rosuvastatin (CRESTOR) 20 MG tablet Take 20 mg by mouth every evening.      sertraline (ZOLOFT) 50 MG tablet Take 50 mg by mouth daily before lunch.   12   No current facility-administered medications for this visit.     Review of Systems    Still notes occasional left upper chest wall pain and soreness that is reproducible with palpation.  She denies palpitations, dyspnea, pnd, orthopnea, n, v, dizziness, syncope, edema, weight gain, or early satiety.  All other systems reviewed and are otherwise negative except as noted above.  Physical Exam    VS:  BP 132/78 (BP Location: Left Arm, Patient Position: Sitting, Cuff Size: Normal)   Pulse 64   Ht 5\' 1"  (1.549 m)   Wt 169 lb 8 oz (76.9 kg)   LMP  (LMP Unknown)   SpO2 98%   BMI 32.03 kg/m  , BMI Body mass index is 32.03 kg/m.     GEN: Well nourished, well developed, in no acute distress. HEENT: normal. Neck: Supple, no JVD, carotid bruits, or masses. Cardiac: RRR, no murmurs, rubs, or gallops. No  clubbing, cyanosis, edema.  Radials/PT 2+ and equal bilaterally.  Respiratory:  Respirations regular and unlabored, clear to auscultation bilaterally. GI: Soft, nontender, nondistended, BS + x 4. MS: no deformity or atrophy. Skin: warm and dry, no rash. Neuro:  Strength and sensation are intact. Psych: Normal affect.  Accessory Clinical Findings    ECG personally reviewed by me today -regular sinus rhythm, 64, no acute ST/T changes - no acute changes.  Lab Results  Component Value Date   WBC 6.2 01/22/2021   HGB 13.8 01/22/2021   HCT 41.1 01/22/2021   MCV 91.5 01/22/2021   PLT 253 01/22/2021   Lab Results  Component Value  Date   CREATININE 0.64 01/22/2021   BUN 12 01/22/2021   NA 141 01/22/2021   K 3.8 01/22/2021   CL 107 01/22/2021   CO2 27 01/22/2021   Lab Results  Component Value Date   ALT 17 01/22/2021   AST 18 01/22/2021   ALKPHOS 57 01/22/2021   BILITOT 0.7 01/22/2021   Assessment & Plan    1.  Preoperative cardiovascular evaluation/CAD: Patient with a history of CAD status post PCI of the right coronary artery in 9688, complicated by spiral dissection requiring 3 overlapping drug-eluting stents.  She has been on chronic dual antiplatelet therapy since then.  She also experiences chronic left upper chest musculoskeletal pain and soreness, which is reproducible with palpation.  For this reason, she underwent stress testing in May 2022, which showed no evidence of ischemia or infarct.  Echocardiogram during that time also showed normal LV function.  Patient is relatively sedentary but is able to walk without symptoms or limitations other than left knee pain.  She is currently pending left total knee arthroplasty scheduled for November 8.  Based on her history, symptoms, and recent testing, her risk of a major cardiac event associated with knee surgery is approximately 0.9%.  Given low risk for cardiac event, she may proceed to surgery without any additional ischemic  testing.  I do recommend that she continue aspirin, beta-blocker, and statin therapy throughout the perioperative period however, she may hold clopidogrel beginning 5 days prior to surgery with a plan to resume it once it is felt to be feasible by the surgical team.  2.  Essential hypertension: Blood pressure stable today 132/78.  She notes that she just took her medicine about 15 minutes ago.  Continue beta-blocker and ARB therapy.  3.  Hyperlipidemia/hypertriglyceridemia: LDL of 62 in April.  She remains on statin and Zetia therapy.  Triglycerides were 317 at that time.  She was prescribed Vascepa but could not afford.  Hopefully, following knee surgery, she can increase activity with focus on weight loss.  Of note, patient asked today if statin/Zetia might be contributing to chronic upper and lower extremity joint discomfort.  She might be interested in a statin holiday and advised that she can try this but would wait at least 2 to 4 weeks after her pending knee surgery as we would like her to be on statin throughout the perioperative period.  4.  Obstructive sleep apnea: On CPAP.  5.  Disposition: Follow-up in 3 to 4 months or sooner if necessary.  Murray Hodgkins, NP 01/23/2021, 10:19 AM

## 2021-01-28 ENCOUNTER — Encounter: Payer: Self-pay | Admitting: Orthopedic Surgery

## 2021-01-28 NOTE — Progress Notes (Signed)
Perioperative Services  Pre-Admission/Anesthesia Testing Clinical Review  Date: 01/28/21  Patient Demographics:  Name: Judy Prince DOB:   1955/04/11 MRN:   [id number]  Planned Surgical Procedure(s):    Case: [id number] Date/Time: 02/04/21 0700   Procedure: TOTAL KNEE ARTHROPLASTY (Left: Knee)   Anesthesia type: Choice   Pre-op diagnosis: Primary osteoarthritis of left knee  M17.12   Location: [facility] OR ROOM 01 / [place] [facility]   Surgeons: Hessie Knows, MD   NOTE: Available PAT nursing documentation and vital signs have been reviewed. Clinical nursing staff has updated patient's PMH/PSHx, current medication list, and drug allergies/intolerances to ensure comprehensive history available to assist in medical decision making as it pertains to the aforementioned surgical procedure and anticipated anesthetic course. Extensive review of available clinical information performed. [place] PMH and PSHx updated with any diagnoses/procedures that  may have been inadvertently omitted during her intake with the pre-admission testing department's nursing staff.  Clinical Discussion:  Judy Prince is a 65 y.o. female who is submitted for pre-surgical anesthesia review and clearance prior to her undergoing the above procedure. Patient has never been a smoker. Pertinent PMH includes: CAD, diastolic dysfunction, HLD, OSAH (waiting for PAP machine), GERD (on daily PPI), gastritis, fibromyalgia, OA depression, anxiety, panic disorder.  Patient is followed by cardiology Fletcher Anon, MD). She was last seen in the cardiology clinic on 01/23/2021; notes reviewed.  At the time of her clinic visit, patient doing well overall from a cardiovascular perspective.  She complained of occasional LEFT upper chest wall discomfort/soreness exacerbated by palpation.  She denied any associated shortness of breath, PND, orthopnea, palpitations, significant peripheral edema, vertiginous symptoms, or presyncope/syncope.   PMH significant for cardiovascular diagnoses.  Patient underwent diagnostic left heart catheterization on 01/21/2006 revealing significant single-vessel CAD.  Patient with 100% stenoses in the proximal, mid, and distal RCA.  She underwent PCI procedure that was complicated by a spiral dissection of the RCA.  Procedure ultimately required placement of 3 Cypher DES; 3.5 x 23 mm to the proximal RCA, 3.0 x 28 mm to the mid RCA, and 3.0 x 23 mm to the distal RCA.  Procedure resulted and 0% residual stenosis and restoration of TIMI-3 flow.  Patient with recurrent LEFT-sided chest pain reported in 07/2020.  She underwent a myocardial perfusion imaging study on 08/06/2020 that revealed a normal left ventricular systolic function with a hyperdynamic LVEF of >65%.  There was no evidence of stress-induced myocardial ischemia or arrhythmia.  Study determined to be normal and low risk.  Patient subsequently underwent a TTE on 08/08/2020 that demonstrated a normal left ventricular systolic function with an EF of 60 to 65%.  There were no regional wall motion abnormalities.  Doppler parameters consistent with impaired left ventricular relaxation (G1DD).  The left atrium was mildly dilated.  There is no evidence of significant valvular regurgitation or transvalvular gradient suggestive of stenosis.    Following her stent placements, patient has remained on chronic daily DAPT therapy (ASA + clopidogrel); compliant with therapy with no evidence or reports of GI bleeding.  Blood pressure reasonably controlled at 132/78 on currently prescribed ARB and beta-blocker therapies.  Patient is on a statin + ezetimibe for her HLD diagnosis and further ASCVD prevention. Functional capacity, as defined by DASI, is documented as being >/= 4 METS.  No changes were made to her medication regimen.  Patient to follow-up with outpatient cardiology in 3 to 4 months or sooner if needed.  Judy Prince is scheduled  for an elective LEFT TOTAL  KNEE ARTHROPLASTY on 02/04/2021 with Dr. Hessie Knows, MD. Given patient's past medical history significant for cardiovascular diagnoses, presurgical cardiac clearance was sought by the PAT team.  Per cardiology, "based on her history, symptoms, and recent testing, her risk of a major cardiac event associated with knee surgery is approximately 0.9%.  Given LOW risk for cardiac event, she may proceed to surgery without any additional ischemic testing".  Again, this patient is on daily DAPT therapy.  She has been instructed on recommendations from her cardiologist for holding her clopidogrel for 5 days prior to her procedure with plans to resume as postoperative bleeding risk felt to be minimized by her primary attending surgeon.  Patient is aware that her last dose of clopidogrel will be on 01/29/2021.  The patient has been asked to continue her daily low-dose ASA throughout the perioperative period.  Patient denies previous perioperative complications with anesthesia in the past. In review of the available records, it is noted that patient underwent a general anesthetic course here (ASA II) in 02/2016 without documented complications.   Vitals with BMI 01/23/2021 01/22/2021 08/15/2020  Height 5\' 1"  5\' 1"  5\' 1"   Weight 169 lbs 8 oz 173 lbs 169 lbs  BMI 32.04 25.3 66.44  Systolic 034 742 595  Diastolic 78 73 88  Pulse 64 59 78    Providers/Specialists:   NOTE: Primary physician provider listed below. Patient may have been seen by APP or partner within same practice.   PROVIDER ROLE / SPECIALTY LAST Fabio Bering, MD ORTHOPEDICS 01/22/2021  Rusty Aus, MD PRIMARY CARE PROVIDER 08/01/2020  Kathlyn Sacramento, MD CARDIOLOGY 01/23/2021   Allergies:  Molds & smuts and Other  Current Home Medications:   No current facility-administered medications for this encounter.    acetaminophen (TYLENOL) 500 MG tablet   aspirin EC 81 MG tablet   b complex vitamins capsule   clopidogrel (PLAVIX) 75 MG  tablet   doxycycline (MONODOX) 50 MG capsule   ezetimibe (ZETIA) 10 MG tablet   famotidine (PEPCID) 20 MG tablet   fluticasone (FLONASE) 50 MCG/ACT nasal spray   losartan (COZAAR) 25 MG tablet   Menthol, Topical Analgesic, (BIOFREEZE EX)   metoprolol (LOPRESSOR) 50 MG tablet   pantoprazole (PROTONIX) 40 MG tablet   rosuvastatin (CRESTOR) 20 MG tablet   sertraline (ZOLOFT) 50 MG tablet   amoxicillin-clavulanate (AUGMENTIN) 875-125 MG tablet   History:   Past Medical History:  Diagnosis Date   Anxiety    Arthritis    Basal cell carcinoma (BCC) of forearm    Coronary artery disease    a.) LHC 01/21/2006: 100 % pRCA, 100% mRCA, 100% dRCA. PCI peformed; 63% mRCA - PCI complicated by spiral dissection --> 3.0x 33mm Cypher DES to dRCA, 3.0x39mm Cypher DES to mRCA, and 3.5x23 mm Cypher DES pRCA. b.) 07/2020 MV: EF>65%, no ischemia/infarct.   Cowden disease    Diastolic dysfunction    a.) 07/2020 Echo: EF 60-65%, no rwma, G1DD, nl RV fxn. Mildly dil LA. Triv MR.   Fibromyalgia    Gastritis    GERD (gastroesophageal reflux disease)    Hemorrhoids    History of kidney stones    Hyperlipidemia LDL goal <70    Hypertension    IBS (irritable bowel syndrome)    Lichen planus    Long term (current) use of antithrombotics/antiplatelets    a.) DAPT (ASA + clopidogrel)   Morbid obesity (HCC)    OSA (obstructive sleep apnea)  a.) "waiting on CPAP machine" as of 01/22/2021   Osteoporosis    Panic disorder    Past Surgical History:  Procedure Laterality Date   ABDOMINAL HYSTERECTOMY     BREAST BIOPSY Right 03/15/2017   PSEUDO-ANGIOMATOUS STROMAL HYPERPLASIA.    CHOLECYSTECTOMY     COLONOSCOPY     CORONARY ANGIOPLASTY WITH STENT PLACEMENT Left 01/21/2006   Procedure: CORONARY ANGIOPLASTY WITH STENT PLACEMENT (Cypher DES x 3 placed; 3.0 x 23 dRCA, 3.0 x 28 mRCA, 3.5 x 23 mm pRCA); Location: [place]; Surgeon: Isaias Cowman, MD   ESOPHAGOGASTRODUODENOSCOPY     FINGER ARTHRODESIS Left  03/26/2016   Procedure: ARTHRODESIS FINGER;  Surgeon: Hessie Knows, MD;  Location: [facility] ORS;  Service: Orthopedics;  Laterality: Left;   SKIN CANCER EXCISION     TUBAL LIGATION     Family History  Problem Relation Age of Onset   Breast cancer Maternal Aunt        x 2 (50's and 59's)   Diabetes Mother    Heart failure Mother    Diabetes Sister    Diabetes Brother    Social History   Tobacco Use   Smoking status: Never   Smokeless tobacco: Never  Vaping Use   Vaping Use: Never used  Substance Use Topics   Alcohol use: No    Alcohol/week: 0.0 standard drinks   Drug use: No    Pertinent Clinical Results:  LABS: Labs reviewed: Acceptable for surgery.  No visits with results within 3 Day(s) from this visit.  Latest known visit with results is:  Hospital Outpatient Visit on 01/22/2021  Component Date Value Ref Range Status   WBC 01/22/2021 6.2  4.0 - 10.5 K/uL Final   RBC 01/22/2021 4.49  3.87 - 5.11 MIL/uL Final   Hemoglobin 01/22/2021 13.8  12.0 - 15.0 g/dL Final   HCT 01/22/2021 41.1  36.0 - 46.0 % Final   MCV 01/22/2021 91.5  80.0 - 100.0 fL Final   MCH 01/22/2021 30.7  26.0 - 34.0 pg Final   MCHC 01/22/2021 33.6  30.0 - 36.0 g/dL Final   RDW 01/22/2021 11.9  11.5 - 15.5 % Final   Platelets 01/22/2021 253  150 - 400 K/uL Final   nRBC 01/22/2021 0.0  0.0 - 0.2 % Final   Neutrophils Relative % 01/22/2021 42  % Final   Neutro Abs 01/22/2021 2.6  1.7 - 7.7 K/uL Final   Lymphocytes Relative 01/22/2021 41  % Final   Lymphs Abs 01/22/2021 2.6  0.7 - 4.0 K/uL Final   Monocytes Relative 01/22/2021 12  % Final   Monocytes Absolute 01/22/2021 0.7  0.1 - 1.0 K/uL Final   Eosinophils Relative 01/22/2021 4  % Final   Eosinophils Absolute 01/22/2021 0.2  0.0 - 0.5 K/uL Final   Basophils Relative 01/22/2021 1  % Final   Basophils Absolute 01/22/2021 0.0  0.0 - 0.1 K/uL Final   Immature Granulocytes 01/22/2021 0  % Final   Abs Immature Granulocytes 01/22/2021 0.02  0.00 - 0.07 K/uL  Final   Performed at [facility], [address] [place], [place] [address]   Sodium 01/22/2021 141  135 - 145 mmol/L Final   Potassium 01/22/2021 3.8  3.5 - 5.1 mmol/L Final   Chloride 01/22/2021 107  98 - 111 mmol/L Final   CO2 01/22/2021 27  22 - 32 mmol/L Final   Glucose, Bld 01/22/2021 97  70 - 99 mg/dL Final   Glucose reference range applies only to samples taken  after fasting for at least 8 hours.   BUN 01/22/2021 12  8 - 23 mg/dL Final   Creatinine, Ser 01/22/2021 0.64  0.44 - 1.00 mg/dL Final   Calcium 01/22/2021 9.2  8.9 - 10.3 mg/dL Final   Total Protein 01/22/2021 7.4  6.5 - 8.1 g/dL Final   Albumin 01/22/2021 3.8  3.5 - 5.0 g/dL Final   AST 01/22/2021 18  15 - 41 U/L Final   ALT 01/22/2021 17  0 - 44 U/L Final   Alkaline Phosphatase 01/22/2021 57  38 - 126 U/L Final   Total Bilirubin 01/22/2021 0.7  0.3 - 1.2 mg/dL Final   GFR, Estimated 01/22/2021 >60  >60 mL/min Final   Comment: (NOTE) Calculated using the CKD-EPI Creatinine Equation (2021)    Anion gap 01/22/2021 7  5 - 15 Final   Performed at [facility], [address] [place], [place] [address]   Color, Urine 01/22/2021 STRAW (A)  YELLOW Final   APPearance 01/22/2021 CLEAR (A)  CLEAR Final   Specific Gravity, Urine 01/22/2021 1.009  1.005 - 1.030 Final   pH 01/22/2021 5.0  5.0 - 8.0 Final   Glucose, UA 01/22/2021 NEGATIVE  NEGATIVE mg/dL Final   Hgb urine dipstick 01/22/2021 SMALL (A)  NEGATIVE Final   Bilirubin Urine 01/22/2021 NEGATIVE  NEGATIVE Final   Ketones, ur 01/22/2021 NEGATIVE  NEGATIVE mg/dL Final   Protein, ur 01/22/2021 NEGATIVE  NEGATIVE mg/dL Final   Nitrite 01/22/2021 NEGATIVE  NEGATIVE Final   Leukocytes,Ua 01/22/2021 SMALL (A)  NEGATIVE Final   RBC / HPF 01/22/2021 0-5  0 - 5 RBC/hpf Final   WBC, UA 01/22/2021 0-5  0 - 5 WBC/hpf Final   Bacteria, UA 01/22/2021 NONE SEEN  NONE SEEN Final   Squamous Epithelial / LPF 01/22/2021 0-5  0 - 5 Final   Performed at [facility], [address] [place], [place] [address]   ABO/RH(D) 01/22/2021 A NEG   Final   Antibody Screen 01/22/2021 NEG   Final   Sample Expiration 01/22/2021 02/05/2021,2359   Final   Extend sample reason 01/22/2021    Final                   Value:NO TRANSFUSIONS OR PREGNANCY IN THE PAST 3 MONTHS Performed at [facility], [address] [place], [place] [address]    MRSA, PCR 01/22/2021 NEGATIVE  NEGATIVE Final   Staphylococcus aureus 01/22/2021 NEGATIVE  NEGATIVE Final   Comment: (NOTE) The Xpert SA Assay (FDA approved for NASAL specimens in patients 2 years of age and older), is one component of a comprehensive surveillance program. It is not intended to diagnose infection nor to guide or monitor treatment. Performed at [facility], [address] [place],  [address]    ECG: Date: 01/22/2021 Time ECG obtained: 1118 AM Rate: 58 bpm Rhythm: sinus bradycardia Axis (leads I and aVF): Normal Intervals: PR 128 ms. QRS 92 ms. QTc 453 ms. ST segment and T wave changes: No evidence of acute ST segment elevation or depression Comparison: Similar to previous tracing obtained on 07/08/2020   IMAGING / PROCEDURES: TRANSTHORACIC ECHOCARDIOGRAM performed on 08/08/2020 Left ventricular ejection fraction, by estimation, is 60 to 65%. The left ventricle has normal function. The left ventricle has no regional wall motion abnormalities. Left ventricular diastolic parameters are consistent with Grade I diastolic dysfunction (impaired relaxation).  Right ventricular systolic function is normal. The right ventricular size is normal.  Left atrial size was  mildly dilated.  The mitral valve is normal in structure. Trivial mitral valve regurgitation.   BILATERAL CAROTID DOPPLER STUDY performed on 08/08/2020 Right Carotid: There was no evidence of thrombus, dissection, atherosclerotic plaque or stenosis in the cervical carotid system.  Left Carotid: There was no  evidence of thrombus, dissection, atherosclerotic plaque or stenosis in the cervical carotid system. Vertebrals:  Bilateral vertebral arteries demonstrate antegrade flow.  Subclavians: Normal flow hemodynamics were seen in bilateral subclavian arteries.   LEXISCAN performed on 08/06/2020 Normal left ventricular systolic function with hyperdynamic LVEF of >65% No regional wall motion abnormalities  No evidence of stress-induced myocardial ischemia or arrhythmia Normal low risk study  MRI KNEE LEFT WO CONTRAST performed on 01/04/2020 Degenerative tearing of the anterior horn lateral meniscus (especially along the superior surface), with grade 3 oblique tear of the posterior horn extending to the inferior surface. 3.8 by 2.4 by 1.5 cm cystic lesion extends posteriorly and caudad from the periphery of the medial meniscus. Although I do not see a definite associated meniscal tear, the proximity of this cystic lesion to the meniscus raises the possibility of occult tear with parameniscal cyst. Ganglion cyst is a differential diagnostic consideration. Tricompartmental degenerative chondral thinning, severe in the lateral compartment. Subcortical marrow edema in the lateral femoral condyle associated with a non-fragmented osteochondral lesion or subcortical stress fracture along the lateral femoral condyle. Small knee joint effusion with synovitis laterally deep to the iliotibial band but no thickening of the iliotibial band itself. Lateral subcutaneous venous varicosities. Mild prepatellar subcutaneous edema. Mild indistinctness of the ACL suggest ACL degeneration without overt tear. Despite efforts by the technologist and patient, motion artifact is present on today's exam and could not be eliminated. This reduces exam sensitivity and specificity.  LEFT HEART CATHETERIZATION AND CORONARY ANGIOGRAPHY performed on 01/21/2006 Normal LV function Single vessel CAD 100% stenosis of the proximal RCA 100%  stenosis of the mid RCA 100% stenosis of the distal RCA PCI complicated by spiral vessel dissection requiring placement of 3 continuous stents.  Post procedurally, there was 0% residual stenosis and restoration of TIMI-3 flow. 3.5 x 23 mm Cypher DES to the proximal RCA 3.0 x 28 mm Cypher DES to the mid RCA 3.0 x 23 mm Cypher DES to the distal RCA  Impression and Plan:  Judy Prince has been referred for pre-anesthesia review and clearance prior to her undergoing the planned anesthetic and procedural courses. Available labs, pertinent testing, and imaging results were personally reviewed by me. This patient has been appropriately cleared by cardiology with an overall ACCEPTABLE/LOW risk of significant perioperative cardiovascular complications.  Based on clinical review performed today (01/28/21), barring any significant acute changes in the patient's overall condition, it is anticipated that she will be able to proceed with the planned surgical intervention. Any acute changes in clinical condition may necessitate her procedure being postponed and/or cancelled. Patient will meet with anesthesia team (MD and/or CRNA) on the day of her procedure for preoperative evaluation/assessment. Questions regarding anesthetic course will be fielded at that time.   Pre-surgical instructions were reviewed with the patient during her PAT appointment and questions were fielded by PAT clinical staff. Patient was advised that if any questions or concerns arise prior to her procedure then she should return a call to PAT and/or her surgeon's office to discuss.  Honor Loh, MSN, APRN, FNP-C, CEN [facility]  Peri-operative Services Nurse Practitioner Phone: [phone] Fax: [phone] 01/28/21 11:40 AM  NOTE: This note has  been prepared using [employer]. Despite my best ability to proofread, there is always the potential that unintentional transcriptional errors may still  occur from this process.

## 2021-01-29 NOTE — Telephone Encounter (Signed)
Reached out to patient to offer assistance and patient states she has been notified by [employer] to get scheduled for her new cpap.

## 2021-01-31 ENCOUNTER — Other Ambulatory Visit: Payer: Self-pay

## 2021-01-31 ENCOUNTER — Other Ambulatory Visit
Admission: RE | Admit: 2021-01-31 | Discharge: 2021-01-31 | Disposition: A | Discharge status: Home / Self Care | Payer: Medicare HMO | Source: Ambulatory Visit | Attending: Orthopedic Surgery | Admitting: Orthopedic Surgery

## 2021-01-31 DIAGNOSIS — Z01812 Encounter for preprocedural laboratory examination: Secondary | ICD-10-CM | POA: Insufficient documentation

## 2021-01-31 DIAGNOSIS — Z20822 Contact with and (suspected) exposure to covid-19: Secondary | ICD-10-CM | POA: Diagnosis not present

## 2021-01-31 LAB — SARS CORONAVIRUS 2 (TAT 6-24 HRS): SARS Coronavirus 2: NEGATIVE

## 2021-02-04 ENCOUNTER — Observation Stay: Payer: Medicare HMO

## 2021-02-04 ENCOUNTER — Observation Stay
Admission: RE | Admit: 2021-02-04 | Discharge: 2021-02-05 | Disposition: A | Discharge status: Home / Self Care | Payer: Medicare HMO | Attending: Orthopedic Surgery | Admitting: Orthopedic Surgery

## 2021-02-04 ENCOUNTER — Ambulatory Visit: Payer: Medicare HMO | Admitting: Urgent Care

## 2021-02-04 ENCOUNTER — Other Ambulatory Visit: Payer: Self-pay

## 2021-02-04 ENCOUNTER — Encounter
Admission: RE | Disposition: A | Discharge status: Home / Self Care | Payer: Self-pay | Source: Home / Self Care | Attending: Orthopedic Surgery

## 2021-02-04 ENCOUNTER — Encounter: Payer: Self-pay | Admitting: Orthopedic Surgery

## 2021-02-04 DIAGNOSIS — Z85828 Personal history of other malignant neoplasm of skin: Secondary | ICD-10-CM | POA: Insufficient documentation

## 2021-02-04 DIAGNOSIS — Z7902 Long term (current) use of antithrombotics/antiplatelets: Secondary | ICD-10-CM | POA: Insufficient documentation

## 2021-02-04 DIAGNOSIS — R069 Unspecified abnormalities of breathing: Secondary | ICD-10-CM | POA: Diagnosis not present

## 2021-02-04 DIAGNOSIS — M1712 Unilateral primary osteoarthritis, left knee: Principal | ICD-10-CM | POA: Insufficient documentation

## 2021-02-04 DIAGNOSIS — Z79899 Other long term (current) drug therapy: Secondary | ICD-10-CM | POA: Insufficient documentation

## 2021-02-04 DIAGNOSIS — Z96651 Presence of right artificial knee joint: Secondary | ICD-10-CM | POA: Diagnosis not present

## 2021-02-04 DIAGNOSIS — G4733 Obstructive sleep apnea (adult) (pediatric): Secondary | ICD-10-CM | POA: Diagnosis not present

## 2021-02-04 DIAGNOSIS — M797 Fibromyalgia: Secondary | ICD-10-CM | POA: Insufficient documentation

## 2021-02-04 DIAGNOSIS — I10 Essential (primary) hypertension: Secondary | ICD-10-CM | POA: Diagnosis not present

## 2021-02-04 DIAGNOSIS — G8918 Other acute postprocedural pain: Secondary | ICD-10-CM

## 2021-02-04 DIAGNOSIS — I2510 Atherosclerotic heart disease of native coronary artery without angina pectoris: Secondary | ICD-10-CM | POA: Insufficient documentation

## 2021-02-04 DIAGNOSIS — Z96652 Presence of left artificial knee joint: Secondary | ICD-10-CM

## 2021-02-04 HISTORY — DX: Irritable bowel syndrome, unspecified: K58.9

## 2021-02-04 HISTORY — PX: TOTAL KNEE ARTHROPLASTY: SHX125

## 2021-02-04 HISTORY — DX: Gastritis, unspecified, without bleeding: K29.70

## 2021-02-04 HISTORY — DX: Long term (current) use of antithrombotics/antiplatelets: Z79.02

## 2021-02-04 HISTORY — DX: Unspecified hemorrhoids: K64.9

## 2021-02-04 HISTORY — DX: Panic disorder (episodic paroxysmal anxiety): F41.0

## 2021-02-04 HISTORY — DX: Fibromyalgia: M79.7

## 2021-02-04 HISTORY — DX: Obstructive sleep apnea (adult) (pediatric): G47.33

## 2021-02-04 LAB — ABO/RH: ABO/RH(D): A NEG

## 2021-02-04 SURGERY — ARTHROPLASTY, KNEE, TOTAL
Anesthesia: Spinal | Site: Knee | Laterality: Left

## 2021-02-04 MED ORDER — DEXMEDETOMIDINE (PRECEDEX) IN NS 20 MCG/5ML (4 MCG/ML) IV SYRINGE
PREFILLED_SYRINGE | INTRAVENOUS | Status: DC | PRN
  Administered 2021-02-04: 8 ug via INTRAVENOUS

## 2021-02-04 MED ORDER — HYDROCODONE-ACETAMINOPHEN 7.5-325 MG PO TABS
1.0000 | ORAL_TABLET | ORAL | Status: DC | PRN
  Administered 2021-02-04: 1 via ORAL
  Administered 2021-02-05 (×2): 2 via ORAL
  Filled 2021-02-04: qty 1

## 2021-02-04 MED ORDER — HYDROCODONE-ACETAMINOPHEN 7.5-325 MG PO TABS
ORAL_TABLET | ORAL | Status: AC
  Administered 2021-02-04: 1 via ORAL
  Filled 2021-02-04: qty 1

## 2021-02-04 MED ORDER — SERTRALINE HCL 50 MG PO TABS
50.0000 mg | ORAL_TABLET | Freq: Every day | ORAL | Status: DC
  Administered 2021-02-05: 50 mg via ORAL
  Filled 2021-02-04: qty 1

## 2021-02-04 MED ORDER — PRONTOSAN WOUND IRRIGATION OPTIME
TOPICAL | Status: DC | PRN
  Administered 2021-02-04: 1 via TOPICAL

## 2021-02-04 MED ORDER — ONDANSETRON HCL 4 MG/2ML IJ SOLN: 4.0000 mg | Freq: Once | INTRAMUSCULAR | Status: DC | PRN

## 2021-02-04 MED ORDER — ONDANSETRON HCL 4 MG/2ML IJ SOLN
INTRAMUSCULAR | Status: AC
  Filled 2021-02-04: qty 2

## 2021-02-04 MED ORDER — B COMPLEX VITAMINS PO CAPS: 1.0000 | ORAL_CAPSULE | Freq: Every day | ORAL | Status: DC

## 2021-02-04 MED ORDER — METOPROLOL TARTRATE 50 MG PO TABS
ORAL_TABLET | ORAL | Status: AC
  Administered 2021-02-04: 50 mg via ORAL
  Filled 2021-02-04: qty 1

## 2021-02-04 MED ORDER — METHOCARBAMOL 500 MG PO TABS
ORAL_TABLET | ORAL | Status: AC
  Administered 2021-02-04: 500 mg via ORAL
  Filled 2021-02-04: qty 1

## 2021-02-04 MED ORDER — ZOLPIDEM TARTRATE 5 MG PO TABS: 5.0000 mg | ORAL_TABLET | Freq: Every evening | ORAL | Status: DC | PRN

## 2021-02-04 MED ORDER — MORPHINE SULFATE (PF) 2 MG/ML IV SOLN
INTRAVENOUS | Status: AC
  Administered 2021-02-04: 1 mg via INTRAVENOUS
  Filled 2021-02-04: qty 1

## 2021-02-04 MED ORDER — MIDAZOLAM HCL 2 MG/2ML IJ SOLN
INTRAMUSCULAR | Status: AC
  Filled 2021-02-04: qty 2

## 2021-02-04 MED ORDER — FLEET ENEMA 7-19 GM/118ML RE ENEM: 1.0000 | ENEMA | Freq: Once | RECTAL | Status: DC | PRN

## 2021-02-04 MED ORDER — CHLORHEXIDINE GLUCONATE 0.12 % MT SOLN
OROMUCOSAL | Status: AC
  Filled 2021-02-04: qty 15

## 2021-02-04 MED ORDER — ONDANSETRON HCL 4 MG/2ML IJ SOLN: 4.0000 mg | Freq: Four times a day (QID) | INTRAMUSCULAR | Status: DC | PRN

## 2021-02-04 MED ORDER — SODIUM CHLORIDE (PF) 0.9 % IJ SOLN
INTRAMUSCULAR | Status: DC | PRN
  Administered 2021-02-04: 92 mL via INTRAMUSCULAR

## 2021-02-04 MED ORDER — HYDROCODONE-ACETAMINOPHEN 5-325 MG PO TABS
ORAL_TABLET | ORAL | Status: AC
  Filled 2021-02-04: qty 1

## 2021-02-04 MED ORDER — HYDROCODONE-ACETAMINOPHEN 7.5-325 MG PO TABS
ORAL_TABLET | ORAL | Status: AC
  Filled 2021-02-04: qty 1

## 2021-02-04 MED ORDER — BUPIVACAINE LIPOSOME 1.3 % IJ SUSP
INTRAMUSCULAR | Status: AC
  Filled 2021-02-04: qty 20

## 2021-02-04 MED ORDER — CEFAZOLIN SODIUM-DEXTROSE 2-4 GM/100ML-% IV SOLN
INTRAVENOUS | Status: AC
  Administered 2021-02-04: 2 g via INTRAVENOUS
  Filled 2021-02-04: qty 100

## 2021-02-04 MED ORDER — MENTHOL 3 MG MT LOZG
1.0000 | LOZENGE | OROMUCOSAL | Status: DC | PRN
  Filled 2021-02-04: qty 9

## 2021-02-04 MED ORDER — 0.9 % SODIUM CHLORIDE (POUR BTL) OPTIME
TOPICAL | Status: DC | PRN
  Administered 2021-02-04: 500 mL

## 2021-02-04 MED ORDER — PHENYLEPHRINE HCL-NACL 20-0.9 MG/250ML-% IV SOLN
INTRAVENOUS | Status: DC | PRN
  Administered 2021-02-04: 15 ug/min via INTRAVENOUS

## 2021-02-04 MED ORDER — MORPHINE SULFATE (PF) 10 MG/ML IV SOLN
INTRAVENOUS | Status: AC
  Filled 2021-02-04: qty 1

## 2021-02-04 MED ORDER — POLYETHYLENE GLYCOL 3350 17 G PO PACK
17.0000 g | PACK | Freq: Every day | ORAL | Status: DC | PRN
  Filled 2021-02-04: qty 1

## 2021-02-04 MED ORDER — ONDANSETRON HCL 4 MG/2ML IJ SOLN
INTRAMUSCULAR | Status: DC | PRN
  Administered 2021-02-04: 4 mg via INTRAVENOUS

## 2021-02-04 MED ORDER — MAGNESIUM HYDROXIDE 400 MG/5ML PO SUSP: 30.0000 mL | Freq: Every day | ORAL | Status: DC

## 2021-02-04 MED ORDER — PHENYLEPHRINE HCL (PRESSORS) 10 MG/ML IV SOLN
INTRAVENOUS | Status: AC
  Filled 2021-02-04: qty 1

## 2021-02-04 MED ORDER — NEOMYCIN-POLYMYXIN B GU 40-200000 IR SOLN
Status: DC | PRN
  Administered 2021-02-04: 12 mL

## 2021-02-04 MED ORDER — ALUM & MAG HYDROXIDE-SIMETH 200-200-20 MG/5ML PO SUSP: 30.0000 mL | ORAL | Status: DC | PRN

## 2021-02-04 MED ORDER — BUPIVACAINE HCL (PF) 0.5 % IJ SOLN
INTRAMUSCULAR | Status: DC | PRN
Start: 2021-02-04 — End: 2021-02-04
  Administered 2021-02-04: 2.5 mL via INTRATHECAL

## 2021-02-04 MED ORDER — SODIUM CHLORIDE 0.9 % IV SOLN: INTRAVENOUS | Status: DC

## 2021-02-04 MED ORDER — LOSARTAN POTASSIUM 25 MG PO TABS
25.0000 mg | ORAL_TABLET | Freq: Every day | ORAL | Status: DC
  Administered 2021-02-04 – 2021-02-05 (×2): 25 mg via ORAL
  Filled 2021-02-04 (×3): qty 1

## 2021-02-04 MED ORDER — HYDROCODONE-ACETAMINOPHEN 5-325 MG PO TABS: 1.0000 | ORAL_TABLET | ORAL | Status: DC | PRN

## 2021-02-04 MED ORDER — ACETAMINOPHEN 325 MG PO TABS: 325.0000 mg | ORAL_TABLET | Freq: Four times a day (QID) | ORAL | Status: DC | PRN

## 2021-02-04 MED ORDER — FENTANYL CITRATE (PF) 100 MCG/2ML IJ SOLN
INTRAMUSCULAR | Status: DC | PRN
  Administered 2021-02-04: 50 ug via INTRAVENOUS
  Administered 2021-02-04 (×2): 25 ug via INTRAVENOUS

## 2021-02-04 MED ORDER — SODIUM CHLORIDE 0.9 % IR SOLN
Status: DC | PRN
  Administered 2021-02-04: 3000 mL

## 2021-02-04 MED ORDER — CEFAZOLIN SODIUM-DEXTROSE 2-4 GM/100ML-% IV SOLN
2.0000 g | INTRAVENOUS | Status: AC
  Administered 2021-02-04: 2 g via INTRAVENOUS

## 2021-02-04 MED ORDER — ACETAMINOPHEN 10 MG/ML IV SOLN
INTRAVENOUS | Status: AC
  Filled 2021-02-04: qty 100

## 2021-02-04 MED ORDER — ACETAMINOPHEN 10 MG/ML IV SOLN
INTRAVENOUS | Status: DC | PRN
  Administered 2021-02-04: 1000 mg via INTRAVENOUS

## 2021-02-04 MED ORDER — PROPOFOL 10 MG/ML IV BOLUS
INTRAVENOUS | Status: DC | PRN
  Administered 2021-02-04: 100 ug/kg/min via INTRAVENOUS

## 2021-02-04 MED ORDER — NEOMYCIN-POLYMYXIN B GU 40-200000 IR SOLN
Status: AC
  Filled 2021-02-04: qty 20

## 2021-02-04 MED ORDER — FENTANYL CITRATE (PF) 100 MCG/2ML IJ SOLN: 25.0000 ug | INTRAMUSCULAR | Status: DC | PRN

## 2021-02-04 MED ORDER — CLOPIDOGREL BISULFATE 75 MG PO TABS
75.0000 mg | ORAL_TABLET | Freq: Every day | ORAL | Status: DC
  Administered 2021-02-04 – 2021-02-05 (×2): 75 mg via ORAL
  Filled 2021-02-04 (×3): qty 1

## 2021-02-04 MED ORDER — BISACODYL 10 MG RE SUPP
10.0000 mg | Freq: Every day | RECTAL | Status: DC | PRN
  Filled 2021-02-04: qty 1

## 2021-02-04 MED ORDER — BUPIVACAINE-EPINEPHRINE (PF) 0.25% -1:200000 IJ SOLN
INTRAMUSCULAR | Status: AC
  Filled 2021-02-04: qty 30

## 2021-02-04 MED ORDER — LACTATED RINGERS IV SOLN: INTRAVENOUS | Status: DC

## 2021-02-04 MED ORDER — PANTOPRAZOLE SODIUM 40 MG PO TBEC
40.0000 mg | DELAYED_RELEASE_TABLET | Freq: Every day | ORAL | Status: DC
  Administered 2021-02-05: 40 mg via ORAL
  Filled 2021-02-04: qty 1

## 2021-02-04 MED ORDER — METHOCARBAMOL 500 MG PO TABS
ORAL_TABLET | ORAL | Status: AC
  Filled 2021-02-04: qty 1

## 2021-02-04 MED ORDER — TRAMADOL HCL 50 MG PO TABS
ORAL_TABLET | ORAL | Status: AC
  Administered 2021-02-04: 50 mg via ORAL
  Filled 2021-02-04: qty 1

## 2021-02-04 MED ORDER — FENTANYL CITRATE (PF) 100 MCG/2ML IJ SOLN
INTRAMUSCULAR | Status: AC
  Filled 2021-02-04: qty 2

## 2021-02-04 MED ORDER — METOCLOPRAMIDE HCL 10 MG PO TABS: 5.0000 mg | ORAL_TABLET | Freq: Three times a day (TID) | ORAL | Status: DC | PRN

## 2021-02-04 MED ORDER — ASPIRIN 81 MG PO CHEW
CHEWABLE_TABLET | ORAL | Status: AC
  Administered 2021-02-04: 81 mg via ORAL
  Filled 2021-02-04: qty 1

## 2021-02-04 MED ORDER — PHENOL 1.4 % MT LIQD
1.0000 | OROMUCOSAL | Status: DC | PRN
  Filled 2021-02-04: qty 177

## 2021-02-04 MED ORDER — EZETIMIBE 10 MG PO TABS
10.0000 mg | ORAL_TABLET | Freq: Every day | ORAL | Status: DC
  Administered 2021-02-05: 10 mg via ORAL
  Filled 2021-02-04: qty 1

## 2021-02-04 MED ORDER — ZOLPIDEM TARTRATE 5 MG PO TABS
ORAL_TABLET | ORAL | Status: AC
  Administered 2021-02-04: 5 mg via ORAL
  Filled 2021-02-04: qty 1

## 2021-02-04 MED ORDER — TRAMADOL HCL 50 MG PO TABS
ORAL_TABLET | ORAL | Status: AC
  Filled 2021-02-04: qty 1

## 2021-02-04 MED ORDER — FAMOTIDINE 20 MG PO TABS: 40.0000 mg | ORAL_TABLET | Freq: Every day | ORAL | Status: DC

## 2021-02-04 MED ORDER — ONDANSETRON HCL 4 MG PO TABS: 4.0000 mg | ORAL_TABLET | Freq: Four times a day (QID) | ORAL | Status: DC | PRN

## 2021-02-04 MED ORDER — CHLORHEXIDINE GLUCONATE 0.12 % MT SOLN: 15.0000 mL | Freq: Once | OROMUCOSAL | Status: DC

## 2021-02-04 MED ORDER — ROSUVASTATIN CALCIUM 20 MG PO TABS
20.0000 mg | ORAL_TABLET | Freq: Every evening | ORAL | Status: DC
  Administered 2021-02-04: 20 mg via ORAL
  Filled 2021-02-04 (×2): qty 1

## 2021-02-04 MED ORDER — METOCLOPRAMIDE HCL 5 MG/ML IJ SOLN: 5.0000 mg | Freq: Three times a day (TID) | INTRAMUSCULAR | Status: DC | PRN

## 2021-02-04 MED ORDER — METHOCARBAMOL 500 MG PO TABS
500.0000 mg | ORAL_TABLET | Freq: Four times a day (QID) | ORAL | Status: DC | PRN
  Administered 2021-02-04 – 2021-02-05 (×2): 500 mg via ORAL

## 2021-02-04 MED ORDER — FAMOTIDINE 20 MG PO TABS
ORAL_TABLET | ORAL | Status: AC
  Administered 2021-02-04: 40 mg via ORAL
  Filled 2021-02-04: qty 2

## 2021-02-04 MED ORDER — MORPHINE SULFATE (PF) 2 MG/ML IV SOLN: 0.5000 mg | INTRAVENOUS | Status: DC | PRN

## 2021-02-04 MED ORDER — DIPHENHYDRAMINE HCL 12.5 MG/5ML PO ELIX
12.5000 mg | ORAL_SOLUTION | ORAL | Status: DC | PRN
  Filled 2021-02-04: qty 10

## 2021-02-04 MED ORDER — MIDAZOLAM HCL 5 MG/5ML IJ SOLN
INTRAMUSCULAR | Status: DC | PRN
  Administered 2021-02-04: 2 mg via INTRAVENOUS

## 2021-02-04 MED ORDER — DOXYCYCLINE MONOHYDRATE 50 MG PO CAPS: 50.0000 mg | ORAL_CAPSULE | Freq: Every day | ORAL | Status: DC

## 2021-02-04 MED ORDER — CEFAZOLIN SODIUM-DEXTROSE 2-4 GM/100ML-% IV SOLN
INTRAVENOUS | Status: AC
  Filled 2021-02-04: qty 100

## 2021-02-04 MED ORDER — PROPOFOL 1000 MG/100ML IV EMUL
INTRAVENOUS | Status: AC
  Filled 2021-02-04: qty 100

## 2021-02-04 MED ORDER — SODIUM CHLORIDE FLUSH 0.9 % IV SOLN
INTRAVENOUS | Status: AC
  Filled 2021-02-04: qty 40

## 2021-02-04 MED ORDER — DOCUSATE SODIUM 100 MG PO CAPS
100.0000 mg | ORAL_CAPSULE | Freq: Two times a day (BID) | ORAL | Status: DC
  Administered 2021-02-04 – 2021-02-05 (×2): 100 mg via ORAL
  Filled 2021-02-04 (×4): qty 1

## 2021-02-04 MED ORDER — CEFAZOLIN SODIUM-DEXTROSE 2-4 GM/100ML-% IV SOLN: 2.0000 g | Freq: Four times a day (QID) | INTRAVENOUS | Status: AC

## 2021-02-04 MED ORDER — ORAL CARE MOUTH RINSE: 15.0000 mL | Freq: Once | OROMUCOSAL | Status: DC

## 2021-02-04 MED ORDER — KETOROLAC TROMETHAMINE 30 MG/ML IJ SOLN
INTRAMUSCULAR | Status: AC
  Filled 2021-02-04: qty 1

## 2021-02-04 MED ORDER — ASPIRIN 81 MG PO CHEW: 81.0000 mg | CHEWABLE_TABLET | Freq: Two times a day (BID) | ORAL | Status: DC

## 2021-02-04 MED ORDER — METHOCARBAMOL 1000 MG/10ML IJ SOLN
500.0000 mg | Freq: Four times a day (QID) | INTRAVENOUS | Status: DC | PRN
  Filled 2021-02-04: qty 5

## 2021-02-04 MED ORDER — METOPROLOL TARTRATE 50 MG PO TABS: 50.0000 mg | ORAL_TABLET | Freq: Two times a day (BID) | ORAL | Status: DC

## 2021-02-04 MED ORDER — TRAMADOL HCL 50 MG PO TABS
50.0000 mg | ORAL_TABLET | Freq: Four times a day (QID) | ORAL | Status: DC
  Administered 2021-02-04 – 2021-02-05 (×2): 50 mg via ORAL

## 2021-02-04 MED ORDER — FLUTICASONE PROPIONATE 50 MCG/ACT NA SUSP
2.0000 | Freq: Every day | NASAL | Status: DC
Start: 2021-02-04 — End: 2021-02-05
  Filled 2021-02-04: qty 16

## 2021-02-04 SURGICAL SUPPLY — 69 items
BLADE SAGITTAL 25.0X1.19X90 (BLADE) ×2 IMPLANT
BLADE SAGITTAL 25.0X1.19X90MM (BLADE) ×1
BLADE SAW 90X13X1.19 OSCILLAT (BLADE) ×3 IMPLANT
BNDG ELASTIC 6X5.8 VLCR STR LF (GAUZE/BANDAGES/DRESSINGS) ×3 IMPLANT
CANISTER WOUND CARE 500ML ATS (WOUND CARE) ×3 IMPLANT
CEMENT HV SMART SET (Cement) ×6 IMPLANT
CHLORAPREP W/TINT 26 (MISCELLANEOUS) ×6 IMPLANT
COOLER POLAR GLACIER W/PUMP (MISCELLANEOUS) ×3 IMPLANT
CUFF TOURN SGL QUICK 24 (TOURNIQUET CUFF)
CUFF TOURN SGL QUICK 34 (TOURNIQUET CUFF)
CUFF TRNQT CYL 24X4X16.5-23 (TOURNIQUET CUFF) IMPLANT
CUFF TRNQT CYL 34X4.125X (TOURNIQUET CUFF) IMPLANT
DRAPE 3/4 80X56 (DRAPES) ×6 IMPLANT
DRSG MEPILEX SACRM 8.7X9.8 (GAUZE/BANDAGES/DRESSINGS) ×3 IMPLANT
ELECT CAUTERY BLADE 6.4 (BLADE) ×3 IMPLANT
ELECT REM PT RETURN 9FT ADLT (ELECTROSURGICAL) ×3
ELECTRODE REM PT RTRN 9FT ADLT (ELECTROSURGICAL) ×1 IMPLANT
FEMORAL COMP CEMENTED SZ3 L (Femur) ×3 IMPLANT
GAUZE 4X4 16PLY ~~LOC~~+RFID DBL (SPONGE) ×3 IMPLANT
GAUZE SPONGE 4X4 12PLY STRL (GAUZE/BANDAGES/DRESSINGS) IMPLANT
GAUZE XEROFORM 1X8 LF (GAUZE/BANDAGES/DRESSINGS) ×3 IMPLANT
GLOVE SURG ORTHO LTX SZ8 (GLOVE) ×3 IMPLANT
GLOVE SURG SYN 9.0  PF PI (GLOVE) ×2
GLOVE SURG SYN 9.0 PF PI (GLOVE) ×1 IMPLANT
GLOVE SURG UNDER LTX SZ8 (GLOVE) ×3 IMPLANT
GLOVE SURG UNDER POLY LF SZ9 (GLOVE) ×3 IMPLANT
GOWN SRG 2XL LVL 4 RGLN SLV (GOWNS) ×1 IMPLANT
GOWN STRL NON-REIN 2XL LVL4 (GOWNS) ×2
GOWN STRL REUS W/ TWL LRG LVL3 (GOWN DISPOSABLE) ×1 IMPLANT
GOWN STRL REUS W/ TWL XL LVL3 (GOWN DISPOSABLE) ×1 IMPLANT
GOWN STRL REUS W/TWL LRG LVL3 (GOWN DISPOSABLE) ×2
GOWN STRL REUS W/TWL XL LVL3 (GOWN DISPOSABLE) ×2
HOLDER FOLEY CATH W/STRAP (MISCELLANEOUS) ×3 IMPLANT
INSERT TIBIAL LEFT FLEX SZ3 (Insert) ×3 IMPLANT
IRRIGATION SURGIPHOR STRL (IV SOLUTION) IMPLANT
IV NS IRRIG 3000ML ARTHROMATIC (IV SOLUTION) ×3 IMPLANT
KIT PREVENA INCISION MGT20CM45 (CANNISTER) ×3 IMPLANT
KIT TURNOVER KIT A (KITS) ×3 IMPLANT
MANIFOLD NEPTUNE II (INSTRUMENTS) ×6 IMPLANT
NDL SAFETY ECLIPSE 18X1.5 (NEEDLE) ×1 IMPLANT
NEEDLE HYPO 18GX1.5 SHARP (NEEDLE) ×2
NEEDLE SPNL 18GX3.5 QUINCKE PK (NEEDLE) ×3 IMPLANT
NEEDLE SPNL 20GX3.5 QUINCKE YW (NEEDLE) ×3 IMPLANT
NS IRRIG 1000ML POUR BTL (IV SOLUTION) IMPLANT
NS IRRIG 500ML POUR BTL (IV SOLUTION) ×3 IMPLANT
PACK TOTAL KNEE (MISCELLANEOUS) ×3 IMPLANT
PAD WRAPON POLAR KNEE (MISCELLANEOUS) ×1 IMPLANT
PATELLA RESURFACING MEDACTA 02 (Bone Implant) ×3 IMPLANT
PENCIL SMOKE EVACUATOR COATED (MISCELLANEOUS) ×3 IMPLANT
PULSAVAC PLUS IRRIG FAN TIP (DISPOSABLE) ×3
SCALPEL PROTECTED #10 DISP (BLADE) ×6 IMPLANT
SPONGE T-LAP 18X18 ~~LOC~~+RFID (SPONGE) ×9 IMPLANT
STAPLER SKIN PROX 35W (STAPLE) ×3 IMPLANT
STEM EXTENSION 11MMX30MM (Stem) ×3 IMPLANT
SUCTION FRAZIER HANDLE 10FR (MISCELLANEOUS) ×2
SUCTION TUBE FRAZIER 10FR DISP (MISCELLANEOUS) ×1 IMPLANT
SUT DVC 2 QUILL PDO  T11 36X36 (SUTURE) ×2
SUT DVC 2 QUILL PDO T11 36X36 (SUTURE) ×1 IMPLANT
SUT ETHIBOND 2 V 37 (SUTURE) IMPLANT
SUT V-LOC 90 ABS DVC 3-0 CL (SUTURE) ×3 IMPLANT
SYR 20ML LL LF (SYRINGE) ×3 IMPLANT
SYR 50ML LL SCALE MARK (SYRINGE) ×6 IMPLANT
TIBIAL TRAY FIXED MEDACTA 0207 (Bone Implant) ×3 IMPLANT
TIP FAN IRRIG PULSAVAC PLUS (DISPOSABLE) ×1 IMPLANT
TOWEL OR 17X26 4PK STRL BLUE (TOWEL DISPOSABLE) ×3 IMPLANT
TOWER CARTRIDGE SMART MIX (DISPOSABLE) ×3 IMPLANT
TRAY FOLEY MTR SLVR 16FR STAT (SET/KITS/TRAYS/PACK) ×3 IMPLANT
WATER STERILE IRR 500ML POUR (IV SOLUTION) ×3 IMPLANT
WRAPON POLAR PAD KNEE (MISCELLANEOUS) ×3

## 2021-02-04 NOTE — Progress Notes (Addendum)
Patient very anxious. Does not want [place] to move or touch leg or it will start hurting. Explained to patient that bone foam needs to be on. She states that she cannot get comfortable with the bone foam and her calf cramps. Pillow placed under calf and ankle for support by previous shift. Patient does not want bone foam on.   Patient states she has panic attacks and will get cold and start shivering and teeth chattering. Warm blankets provided. Also states her back hurts. Placed pillows under back and buttocks to help with pressure.   Patient complaining of pain in the thigh area and top of knee. Educated patient to move her leg side to side and try to move around some and not be so stiff. Patient did not want to move leg. Explained to patient that when PT comes she will have to get up and walk to go home.

## 2021-02-04 NOTE — H&P (Signed)
Chief Complaint  Patient presents with   Left Knee - Follow-up  TKA 02/04/21    History of the Present Illness: Judy Prince is a 65 y.o. female here today.   The patient presents for H and P for a right total knee arthroplasty scheduled for 02/04/2021. The patient was previously seen about 1 month ago. She has pain with ambulation and pain going up and down stairs at work. She has had injections in the past without relief. She works at [employer], and she has to use stairs at work. No prior knee surgeries. Previous exam showed internal rotation of the hip is 30 degrees, external to 60 degrees. Left knee range of motion is 5 to 110 degrees.  The patient states her right knee is feeling a little bit sore. She states she has popping on the lateral aspect of her right knee.  The patient states she may get a COVID-19 test. She has some sinus issues currently. She believes it is due to the change in weather, and turning the heat on, as she always gets these symptoms. Her blood pressure was high at her last visit, and she states that was because she had not taken her blood pressure medication, as she cannot take it when she first gets up in the morning. She is going to start Augmentin.  I have reviewed past medical, surgical, social and family history, and allergies as documented in the EMR.  Past Medical History: Past Medical History:  Diagnosis Date   Anxiety  panic disorder   CAD (coronary artery disease)   Fibromyalgia  IBS   Gastritis   Hemorrhoids   Hyperlipidemia   Hypertension   Kidney stones   Osteoporosis 12/20/2013  2/15/ fosamax   Seasonal allergies   Past Surgical History: Past Surgical History:  Procedure Laterality Date   CHOLECYSTECTOMY   COLONOSCOPY 10/07/2011  FH Colon Polyps (Mother): [facility]BF 09/2016; Recall Ltr mailed 09/04/2016 (dw)   COLONOSCOPY 06/13/2019  Tubular adenoma of the colon/Repeat 13yrs/TKT   DILATION AND CURETTAGE, DIAGNOSTIC / THERAPEUTIC   EGD  10/07/2011, 09/22/1999, 02/26/1992, 05/28/1987   EGD 06/13/2019  Gastritis/Fundic gland polyp/No Repeat/TKT   Heart stents x 3   HYSTERECTOMY  partial   TUBAL LIGATION   Past Family History: Family History  Problem Relation Age of Onset   Myocardial Infarction (Heart attack) Mother   Cataracts Mother   Diabetes Mother   Fibromyalgia Mother   Arthritis Mother   High blood pressure (Hypertension) Mother   Hyperlipidemia (Elevated cholesterol) Mother   Colon polyps Mother   Myocardial Infarction (Heart attack) Father   Other Father  Stomach problems   Myocardial Infarction (Heart attack) Brother   Heart block Brother   Leukemia Brother   Arthritis Sister   Medications: Current Outpatient Medications Ordered in Epic  Medication Sig Dispense Refill   acetaminophen (TYLENOL) 500 MG tablet Take 500 mg by mouth every 6 (six) hours as needed for Pain.   aspirin 81 MG EC tablet Take 81 mg by mouth once daily.   calcium citrate-vitamin D3 (CITRACAL+D PETITES) 200 mg calcium -250 unit tablet Take 1 tablet by mouth once daily occasionally   cholecalciferol (VITAMIN D3) 2,000 unit tablet Take 1 tablet (2,000 Units total) by mouth once daily 30 tablet 11   clopidogreL (PLAVIX) 75 mg tablet TAKE 1 TABLET BY MOUTH EVERY DAY 90 tablet 1   cyanocobalamin, vitamin B-12, 3,000 mcg Lozg Place 1 lozenge under the tongue once daily.   doxycycline (MONODOX) 50 MG  capsule Take 1 capsule by mouth once daily   ezetimibe (ZETIA) 10 mg tablet Take 10 mg by mouth once daily   famotidine (PEPCID) 40 MG tablet Take 40 mg by mouth nightly   fluticasone propionate (FLONASE) 50 mcg/actuation nasal spray Place 2 sprays into both nostrils once daily 16 g 11   metoprolol tartrate (LOPRESSOR) 50 MG tablet TAKE 1 TABLET BY MOUTH TWICE A DAY 180 tablet 1   pantoprazole (PROTONIX) 40 MG DR tablet TAKE 1 TABLET BY MOUTH EVERY DAY 90 tablet 3   potassium chloride (KLOR-CON) 10 mEq ER tablet Take 1 tablet by mouth once  daily   rosuvastatin (CRESTOR) 20 MG tablet TAKE 1 TABLET BY MOUTH EVERY DAY 90 tablet 1   sertraline (ZOLOFT) 50 MG tablet Take 1 tablet (50 mg total) by mouth once daily 90 tablet 3   amoxicillin-clavulanate (AUGMENTIN) 875-125 mg tablet Take 1 tablet (875 mg total) by mouth every 12 (twelve) hours for 7 days (Patient not taking: Reported on 01/22/2021) 14 tablet 0   cyclobenzaprine (FLEXERIL) 10 MG tablet Take 1 tablet by mouth at bedtime (Patient not taking: Reported on 01/22/2021)   diclofenac (VOLTAREN) 1 % topical gel Apply 4 g topically as needed (Patient not taking: Reported on 01/22/2021)   losartan (COZAAR) 25 MG tablet Take 1 tablet by mouth once daily   No current Epic-ordered facility-administered medications on file.   Allergies: Allergies  Allergen Reactions   House Dust Other (See Comments)   Mold Other (See Comments)   Other Unknown  Trees    Body mass index is 32.23 kg/m.  Review of Systems: A comprehensive 14 point ROS was performed, reviewed, and the pertinent orthopaedic findings are documented in the HPI.  Vitals:  01/22/21 0834  BP: (!) 162/74    General Physical Examination:   General/Constitutional: No apparent distress: well-nourished and well developed. Eyes: Pupils equal, round with synchronous movement. Lungs: Clear to auscultation HEENT: Normal Vascular: No edema, swelling or tenderness, except as noted in detailed exam. Cardiac: Heart rate and rhythm is regular. Integumentary: No impressive skin lesions present, except as noted in detailed exam. Neuro/Psych: Normal mood and affect, oriented to person, place and time.  On exam, patellofemoral crepitus. Sinus issues.  Radiographs:  No results were obtained or interpreted today.  Assessment: ICD-10-CM  1. Primary osteoarthritis of left knee M17.12  2. Respiration abnormal R06.9   Plan:  The patient has clinical findings of right knee osteoarthritis.  We discussed the patient's prior  x-ray findings. I explained she has arthritis and tissue catching and snapping in her right knee. I recommend right total knee arthroplasty. I explained the surgery and postoperative course in detail. We will get a COVID-19 test today to make sure she is okay for surgery. If it is positive, we can keep her on the schedule, but will recheck for the hospital. We discussed surgery, and particularly rehab at length.  Surgical Risks:  The nature of the condition and the proposed procedure has been reviewed in detail with the patient. Surgical versus non-surgical options and prognosis for recovery have been reviewed and the inherent risks and benefits of each have been discussed including the risks of infection, bleeding, injury to nerves/blood vessels/tendons, incomplete relief of symptoms, persisting pain and/or stiffness, loss of function, complex regional pain syndrome, failure of the procedure, as appropriate.  Teeth: Normal  Scribe Attestation: I, Dawn Royse, am acting as scribe for [employer], MD.   Electronically signed by Hessie Knows  Broadus John, MD at 01/23/2021 8:32 PM EDT Reviewed  H+P. No changes noted.

## 2021-02-04 NOTE — Evaluation (Signed)
Occupational Therapy Evaluation Patient Details Name: Judy Prince MRN: [id number] DOB: [birth year] Today's Date: 02/04/2021   History of Present Illness 65yo female s/p L TKA. PMHx includes sleep apnea on CPAP, HTN, CAD, hx cardiac stents, anxiety, fibromyalgia, GERD, HLD, IBS, and OA.   Clinical Impression   Pt seen for OT evaluation this date, POD#0 from above surgery. Pt was independent in all ADL prior to surgery, working, and driving but increasingly limited by L knee pain. Pt is eager to return to PLOF with less pain and improved safety and independence. Pt currently requires minimal assist for LB dressing and bathing while in seated position due to pain and limited AROM of L knee. Pt instructed in polar care mgt, falls prevention strategies, pet care considerations, L knee positioning, home/routines modifications, DME/AE for LB bathing and dressing tasks, and compression stocking mgt. Pt verbalized understanding. Handout provided to support recall and carryover. Pt would benefit from skilled OT services including additional instruction in dressing techniques with or without assistive devices for dressing and bathing skills to support recall and carryover prior to discharge and ultimately to maximize safety, independence, and minimize falls risk and caregiver burden. At this time recommend [facility] services upon discharge. Will assess as pt progresses.    Recommendations for follow up therapy are one component of a multi-disciplinary discharge planning process, led by the attending physician.  Recommendations may be updated based on patient status, additional functional criteria and insurance authorization.   Follow Up Recommendations  Home health OT    Assistance Recommended at Discharge PRN  Functional Status Assessment  Patient has had a recent decline in their functional status and demonstrates the ability to make significant improvements in function in a reasonable and predictable amount of  time.  Equipment Recommendations  BSC/3in1;Other (comment) (2WW)    Recommendations for Other Services       Precautions / Restrictions Precautions Precautions: Fall Restrictions Weight Bearing Restrictions: Yes LLE Weight Bearing: Weight bearing as tolerated      Mobility Bed Mobility Overal bed mobility: Needs Assistance Bed Mobility: Supine to Sit;Sit to Supine     Supine to sit: Min assist;HOB elevated Sit to supine: Min assist   General bed mobility comments: MINA  for LLE mgt    Transfers                   General transfer comment: deferred      Balance Overall balance assessment: Needs assistance Sitting-balance support: No upper extremity supported;Feet supported Sitting balance-Leahy Scale: Good                                     ADL either performed or assessed with clinical judgement   ADL Overall ADL's : Needs assistance/impaired                                       General ADL Comments: Pt currently requires MIN A for LB ADL tasks 2/2 decreased strength/ROM/pain in L knee. ADL transfers deferred 2/2 full sensation not yet returned to LLE.     Vision         Perception     Praxis      Pertinent Vitals/Pain Pain Assessment: 0-10 Pain Score: 7  Pain Location: L knee Pain Descriptors / Indicators: Aching Pain Intervention(s): Limited activity within  patient's tolerance;Monitored during session;Repositioned;RN gave pain meds during session;Premedicated before session;Ice applied     Hand Dominance     Extremity/Trunk Assessment Upper Extremity Assessment Upper Extremity Assessment: Overall WFL for tasks assessed   Lower Extremity Assessment Lower Extremity Assessment: LLE deficits/detail LLE Deficits / Details: s/p TKA, mild decreased sensation still but improving LLE Sensation: decreased light touch   Cervical / Trunk Assessment Cervical / Trunk Assessment: Normal   Communication  Communication Communication: No difficulties   Cognition Arousal/Alertness: Awake/alert Behavior During Therapy: WFL for tasks assessed/performed Overall Cognitive Status: Within Functional Limits for tasks assessed                                       General Comments       Exercises Other Exercises Other Exercises: Pt instructed in falls prevention, home/routines modifications, AE/dME, pet care considerations, polar care mgt, positioning of knee, and compression stocking mgt; handout provided   Shoulder Instructions      Home Living Family/patient expects to be discharged to:: Private residence Living Arrangements: Spouse/significant other Available Help at Discharge: Family;Available PRN/intermittently;Available 24 hours/day Type of Home: House Home Access: Ramped entrance     Home Layout: One level     Bathroom Shower/Tub: Teacher, early years/pre: Handicapped height     Home Equipment: [place] - single point   Additional Comments: SPC was her mother's      Prior Functioning/Environment Prior Level of Function : Independent/Modified Independent;Working/employed;Driving;History of Falls (last six months) (1 fall 2/2 cat vomit on ramp)             Mobility Comments: no AD          OT Problem List: Decreased strength;Pain;Decreased range of motion;Impaired balance (sitting and/or standing);Decreased knowledge of use of DME or AE      OT Treatment/Interventions: Self-care/ADL training;Therapeutic exercise;Therapeutic activities;DME and/or AE instruction;Patient/family education;Balance training    OT Goals(Current goals can be found in the care plan section) Acute Rehab OT Goals Patient Stated Goal: to get stronger and have less pain OT Goal Formulation: With patient Time For Goal Achievement: 02/18/21 Potential to Achieve Goals: Good ADL Goals Pt Will Perform Lower Body Dressing: with modified independence;with caregiver  independent in assisting;sit to/from stand Pt Will Transfer to Toilet: with modified independence;ambulating (elevated commode, LRAD for amb) Additional ADL Goal #1: Pt will independently instruct family in polar care mgt Additional ADL Goal #2: Pt will independently instruct family in compression stocking mgt  OT Frequency: Min 2X/week   Barriers to D/C:            Co-evaluation              AM-PAC OT "6 Clicks" Daily Activity     Outcome Measure Help from another person eating meals?: None Help from another person taking care of personal grooming?: None Help from another person toileting, which includes using toliet, bedpan, or urinal?: A Lot Help from another person bathing (including washing, rinsing, drying)?: A Little Help from another person to put on and taking off regular upper body clothing?: None Help from another person to put on and taking off regular lower body clothing?: A Little 6 Click Score: 20   End of Session Nurse Communication: Mobility status  Activity Tolerance: Patient tolerated treatment well Patient left: in bed;with call bell/phone within reach;with bed alarm set;with SCD's reapplied;Other (comment) (polar care, wound vac, and  foley in place)  OT Visit Diagnosis: Other abnormalities of gait and mobility (R26.89);History of falling (Z91.81);Pain Pain - Right/Left: Left Pain - part of body: Knee                Time: 5374-8270 OT Time Calculation (min): 38 min Charges:  OT General Charges $OT Visit: 1 Visit OT Evaluation $OT Eval Moderate Complexity: 1 Mod OT Treatments $Self Care/Home Management : 23-37 mins  Ardeth Perfect., MPH, MS, OTR/L ascom [phone] 02/04/21, 1:47 PM

## 2021-02-04 NOTE — Plan of Care (Signed)
  Problem: Education: Goal: Knowledge of General Education information will improve Description: Including pain rating scale, medication(s)/side effects and non-pharmacologic comfort measures Outcome: Progressing   Problem: Health Behavior/Discharge Planning: Goal: Ability to manage health-related needs will improve Outcome: Progressing   Problem: Clinical Measurements: Goal: Ability to maintain clinical measurements within normal limits will improve Outcome: Progressing Goal: Will remain free from infection Outcome: Progressing Goal: Diagnostic test results will improve Outcome: Progressing Goal: Respiratory complications will improve Outcome: Progressing Goal: Cardiovascular complication will be avoided Outcome: Progressing   Problem: Activity: Goal: Risk for activity intolerance will decrease Outcome: Progressing   Problem: Nutrition: Goal: Adequate nutrition will be maintained Outcome: Progressing   Problem: Coping: Goal: Level of anxiety will decrease Outcome: Progressing   Problem: Elimination: Goal: Will not experience complications related to bowel motility Outcome: Progressing Goal: Will not experience complications related to urinary retention Outcome: Progressing   Problem: Pain Managment: Goal: General experience of comfort will improve Outcome: Progressing   Problem: Safety: Goal: Ability to remain free from injury will improve Outcome: Progressing   Problem: Skin Integrity: Goal: Risk for impaired skin integrity will decrease Outcome: Progressing   Problem: Education: Goal: Knowledge of the prescribed therapeutic regimen will improve Outcome: Progressing   Problem: Activity: Goal: Ability to avoid complications of mobility impairment will improve Outcome: Progressing Goal: Range of joint motion will improve Outcome: Progressing   Problem: Clinical Measurements: Goal: Postoperative complications will be avoided or minimized Outcome:  Progressing   Problem: Pain Management: Goal: Pain level will decrease with appropriate interventions Outcome: Progressing   Problem: Skin Integrity: Goal: Will show signs of wound healing Outcome: Progressing

## 2021-02-04 NOTE — Transfer of Care (Signed)
Immediate Anesthesia Transfer of Care Note  Patient: Cigi J Molstad  Procedure(s) Performed: TOTAL KNEE ARTHROPLASTY (Left: Knee)  Patient Location: PACU  Anesthesia Type:Spinal  Level of Consciousness: awake, oriented and sedated  Airway & Oxygen Therapy: Patient Spontanous Breathing and Patient connected to face mask oxygen  Post-op Assessment: Report given to RN and Post -op Vital signs reviewed and stable  Post vital signs: stable  Last Vitals:  Vitals Value Taken Time  BP 111/61 02/04/21 0901  Temp    Pulse 77 02/04/21 0904  Resp 16 02/04/21 0904  SpO2 100 % 02/04/21 0904  Vitals shown include unvalidated device data.  Last Pain:  Vitals:   02/04/21 0707  TempSrc: Temporal  PainSc: 5          Complications: No notable events documented.

## 2021-02-04 NOTE — Anesthesia Procedure Notes (Signed)
Spinal  Patient location during procedure: OR Start time: 02/04/2021 7:18 AM End time: 02/04/2021 7:23 AM Reason for block: surgical anesthesia Staffing Anesthesiologist: Molli Barrows, MD Resident/CRNA: Demetrius Charity, CRNA Other anesthesia staff: Lucita Ferrara, RN Preanesthetic Checklist Completed: patient identified, IV checked, site marked, risks and benefits discussed, surgical consent, monitors and equipment checked, pre-op evaluation and timeout performed Spinal Block Patient position: sitting Prep: DuraPrep Patient monitoring: heart rate, cardiac monitor, continuous pulse ox and blood pressure Approach: midline Location: L3-4 Injection technique: single-shot Needle Needle type: [place]  Needle gauge: 24 G Needle length: 9 cm Assessment Sensory level: T4 Events: CSF return Additional Notes Placed by Lucita Ferrara, SRNA

## 2021-02-04 NOTE — Anesthesia Preprocedure Evaluation (Signed)
Anesthesia Evaluation  Patient identified by MRN, date of birth, ID band Patient awake    Reviewed: Allergy & Precautions, H&P , NPO status , Patient's Chart, lab work & pertinent test results, reviewed documented beta blocker date and time   Airway Mallampati: III   Neck ROM: full    Dental  (+) Teeth Intact   Pulmonary sleep apnea and Continuous Positive Airway Pressure Ventilation ,    Pulmonary exam normal        Cardiovascular Exercise Tolerance: Poor hypertension, On Medications + CAD and + Cardiac Stents  Normal cardiovascular exam Rhythm:regular Rate:Normal     Neuro/Psych PSYCHIATRIC DISORDERS Anxiety  Neuromuscular disease    GI/Hepatic Neg liver ROS, GERD  ,  Endo/Other  negative endocrine ROS  Renal/GU negative Renal ROS  negative genitourinary   Musculoskeletal   Abdominal   Peds  Hematology negative hematology ROS (+)   Anesthesia Other Findings Past Medical History: No date: Anxiety No date: Arthritis No date: Basal cell carcinoma (BCC) of forearm No date: Coronary artery disease     Comment:  a.) LHC 01/21/2006: 100 % pRCA, 100% mRCA, 100% dRCA.               PCI peformed; 62% mRCA - PCI complicated by spiral               dissection --> 3.0x 72mm Cypher DES to dRCA, 3.0x37mm               Cypher DES to mRCA, and 3.5x23 mm Cypher DES pRCA. b.)               07/2020 MV: EF>65%, no ischemia/infarct. 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               Mildly dil LA. Triv MR. No date: Fibromyalgia No date: Gastritis No date: GERD (gastroesophageal reflux disease) No date: Hemorrhoids No date: History of kidney stones No date: Hyperlipidemia LDL goal <70 No date: Hypertension No date: IBS (irritable bowel syndrome) No date: Lichen planus No date: Long term (current) use of antithrombotics/antiplatelets      Comment:  a.) DAPT (ASA + clopidogrel) No date: Morbid obesity ([place]) No date: OSA (obstructive sleep apnea)     Comment:  a.) "waiting on CPAP machine" as of 01/22/2021 No date: Osteoporosis No date: Panic disorder Past Surgical History: No date: ABDOMINAL HYSTERECTOMY 03/15/2017: BREAST BIOPSY; Right     Comment:  PSEUDO-ANGIOMATOUS STROMAL HYPERPLASIA.  No date: CHOLECYSTECTOMY No date: COLONOSCOPY 01/21/2006: CORONARY ANGIOPLASTY WITH STENT PLACEMENT; Left     Comment:  Procedure: CORONARY ANGIOPLASTY WITH STENT PLACEMENT               (Cypher DES x 3 placed; 3.0 x 23 dRCA, 3.0 x 28 mRCA, 3.5              x 23 mm pRCA); Location: [place]; Surgeon: Isaias Cowman, MD No date: ESOPHAGOGASTRODUODENOSCOPY 03/26/2016: FINGER ARTHRODESIS; Left     Comment:  Procedure: ARTHRODESIS FINGER;  Surgeon: Hessie Knows,               MD;  Location: [facility] ORS;  Service: Orthopedics;                Laterality: Left; No date: SKIN CANCER EXCISION No date: TUBAL  LIGATION   Reproductive/Obstetrics negative OB ROS                             Anesthesia Physical Anesthesia Plan  ASA: 3  Anesthesia Plan: Spinal   Post-op Pain Management:    Induction:   PONV Risk Score and Plan: 3  Airway Management Planned:   Additional Equipment:   Intra-op Plan:   Post-operative Plan:   Informed Consent: I have reviewed the patients History and Physical, chart, labs and discussed the procedure including the risks, benefits and alternatives for the proposed anesthesia with the patient or authorized representative who has indicated his/her understanding and acceptance.     Dental Advisory Given  Plan Discussed with: CRNA  Anesthesia Plan Comments:         Anesthesia Quick Evaluation

## 2021-02-04 NOTE — Op Note (Signed)
02/04/2021  9:05 AM  PATIENT:  Judy Prince   MRN: [id number]  PRE-OPERATIVE DIAGNOSIS:  Primary localized osteoarthritis of left knee   POST-OPERATIVE DIAGNOSIS:  Same   PROCEDURE:  Procedure(s): Left TOTAL KNEE ARTHROPLASTY   SURGEON: Laurene Footman, MD   ASSISTANTS: Rachelle Hora, PA-C   ANESTHESIA:   spinal   EBL:  100   BLOOD ADMINISTERED:none   DRAINS:  Incisional wound VAC     LOCAL MEDICATIONS USED:  MARCAINE    and OTHER Exparel Toradol and morphine   SPECIMEN:  No Specimen   DISPOSITION OF SPECIMEN:  N/A   COUNTS:  YES   TOURNIQUET: 52 minutes at 300 mm Hg   IMPLANTS: Medacta  GMK sphere system with left 3 femur, left 3 tibia with short stem and 11 mm insert.  Size 2 patella, all components cemented.   DICTATION: Viviann Spare Dictation   patient was brought to the operating room and spinal anesthesia was obtained.  After prepping and draping the left leg in sterile fashion, and after patient identification and timeout procedures were completed, tourniquet was raised  and midline skin incision was made followed by medial parapatellar arthrotomy with moderate medial compartment osteoarthritis, advanced patellofemoral arthritis and moderate lateral compartment arthritis, partial synovectomy was also carried out.   There was extensive inflammatory tissue throughout the joint the ACL and PCL and fat pad were excised along with anterior horns of the meniscus. The proximal tibia cutting guide from  the extra medullary  system was applied and the proximal tibia cut carried out.  The distal femoral cut was carried out in a similar fashion using the intramedullary guide.  9 mm distal femur cut was made and sizing guide applied and sized to a size 3.     The 3 femoral cutting guide applied with anterior posterior and chamfer cuts made.  The posterior horns of the menisci were removed at this point.   Injection of the above medication was carried out after the femoral and tibial cuts  were carried out.  The 3 baseplate trial was placed pinned into position and proximal tibial preparation carried out with drilling hand reaming and the keel punch followed by placement of the 3 femur and sizing the tibial insert size 11 millimeter gave the best fit with stability and full extension.  The distal femoral drill holes were made in the notch cut for the trochlear groove was then carried out with trials were then removed the patella was cut using the patellar cutting guide and it sized to a size 2after drill holes have been made  The knee was irrigated with pulsatile lavage and the bony surfaces dried the tibial component was cemented into place first.  Excess cement was removed and the polyethylene insert placed with a torque screw placed with a torque screwdriver tightened.  The distal femoral component was placed and the knee was held in extension as the patellar button was clamped into place.  After the cement was set, excess cement was removed and the knee was again irrigated thoroughly thoroughly irrigated.  The tourniquet was let down and hemostasis checked with electrocautery. The arthrotomy was repaired with a heavy Quill suture,  followed by 3-0 V lock subcuticular closure, skin staples followed by incisional wound VAC and Polar Care.Marland Kitchen   PLAN OF CARE: Admit for overnight observation   PATIENT DISPOSITION:  PACU - hemodynamically stable.

## 2021-02-04 NOTE — Evaluation (Signed)
Physical Therapy Evaluation Patient Details Name: Judy Prince MRN: [id number] DOB: 09-08-1955 Today's Date: 02/04/2021  History of Present Illness  65yo female s/p L TKA. PMHx includes sleep apnea on CPAP, HTN, CAD, hx cardiac stents, anxiety, fibromyalgia, GERD, HLD, IBS, and OA.    Clinical Impression  Pt admitted with above diagnosis. Pt received in bed agreeable to PT services. Reports pain at 8/10 NPS in L knee. Able to perform quad set indicating quad activation along with ability to DF/PF at ankle. Decreased sensation to LT appreciated on L medial thigh and ankle. Reports being Independent at baseline with all mobility without AD with 24/7 family support. Able to transfer from supine to seated EOB with minA for LLE management. Denies lightheadedness and dizziness. Able to stand with minguard to RW for cuing for hand placement. Excellent quad control in standing appreciated with alternating marches with no indication of L knee buckling. Pt does endorse nausea with standing after ~1-2 min in standing requesting to sit down. Pt attempted multiple times to vomit but unable to. MinA to return to supine for LLE management. Able to scoot up in bed with RLE and UE's on bed rails with supervision. L knee placed in bone foam and polar care unit turned on. Pt reports improvement in symptoms after ~ 2-3 min of nausea/attempted vomiting. Based off of bed mobility and ability to stand with no assist, anticipate pt will be safe to d/c to home environment with HHPT as pt has excellent family support. Pt currently with functional limitations due to the deficits listed below (see PT Problem List). Pt will benefit from skilled PT to increase their independence and safety with mobility to allow discharge to the venue listed below.     Recommendations for follow up therapy are one component of a multi-disciplinary discharge planning process, led by the attending physician.  Recommendations may be updated based on  patient status, additional functional criteria and insurance authorization.  Follow Up Recommendations Home health PT    Assistance Recommended at Discharge Intermittent Supervision/Assistance  Functional Status Assessment Patient has had a recent decline in their functional status and demonstrates the ability to make significant improvements in function in a reasonable and predictable amount of time.  Equipment Recommendations  Rolling walker (2 wheels);BSC/3in1    Recommendations for Other Services       Precautions / Restrictions Precautions Precautions: Fall Restrictions Weight Bearing Restrictions: Yes LLE Weight Bearing: Weight bearing as tolerated      Mobility  Bed Mobility Overal bed mobility: Needs Assistance Bed Mobility: Supine to Sit;Sit to Supine     Supine to sit: Min assist;HOB elevated Sit to supine: Min assist   General bed mobility comments: MINA  for LLE mgt Patient Response: Cooperative  Transfers Overall transfer level: Needs assistance Equipment used: Rolling walker (2 wheels) Transfers: Sit to/from Stand Sit to Stand: Min guard           General transfer comment: Excellent quad control in standing. Able to perform alternating marches in standing.    Ambulation/Gait               General Gait Details: Deferred due to nausea and vomiting.  Stairs            Wheelchair Mobility    Modified Rankin (Stroke Patients Only)       Balance Overall balance assessment: Needs assistance Sitting-balance support: No upper extremity supported;Feet supported Sitting balance-Leahy Scale: Good       Standing balance-Leahy Scale:  Fair Standing balance comment: Relies on BUE support on RW                             Pertinent Vitals/Pain Pain Assessment: 0-10 Pain Score: 8  Pain Location: L knee Pain Descriptors / Indicators: Aching Pain Intervention(s): Limited activity within patient's tolerance;Monitored during  session;Repositioned    Home Living Family/patient expects to be discharged to:: Private residence Living Arrangements: Spouse/significant other Available Help at Discharge: Family;Available PRN/intermittently;Available 24 hours/day Type of Home: House Home Access: Ramped entrance       Home Layout: One level Home Equipment: Cane - single point Additional Comments: SPC was her mother's    Prior Function Prior Level of Function : Independent/Modified Independent;Working/employed;Driving;History of Falls (last six months)             Mobility Comments: no AD       Hand Dominance        Extremity/Trunk Assessment   Upper Extremity Assessment Upper Extremity Assessment: Overall WFL for tasks assessed    Lower Extremity Assessment Lower Extremity Assessment: LLE deficits/detail LLE Deficits / Details: s/p TKA, mild decreased sensation at medial knee and ankle. LLE Sensation: decreased light touch    Cervical / Trunk Assessment Cervical / Trunk Assessment: Normal  Communication   Communication: No difficulties  Cognition Arousal/Alertness: Awake/alert Behavior During Therapy: WFL for tasks assessed/performed Overall Cognitive Status: Within Functional Limits for tasks assessed                                 General Comments: Nausea and vomiting with standing. Became anxious with feeling sick.        General Comments      Exercises Total Joint Exercises Quad Sets: AROM;Left;5 reps;Supine Marching in Standing: AROM;Both;5 reps;Standing Other Exercises Other Exercises: Role of PT in acute setting, safe transfers and use of RW   Assessment/Plan    PT Assessment Patient needs continued PT services  PT Problem List Decreased strength;Decreased range of motion;Decreased activity tolerance;Decreased safety awareness;Decreased balance;Pain;Decreased mobility;Impaired sensation       PT Treatment Interventions DME instruction;Balance  training;Gait training;Neuromuscular re-education;Stair training;Functional mobility training;Patient/family education;Therapeutic activities;Therapeutic exercise    PT Goals (Current goals can be found in the Care Plan section)  Acute Rehab PT Goals Patient Stated Goal: to go home PT Goal Formulation: With patient Time For Goal Achievement: 02/18/21 Potential to Achieve Goals: Good    Frequency BID   Barriers to discharge        Co-evaluation               AM-PAC PT "6 Clicks" Mobility  Outcome Measure Help needed turning from your back to your side while in a flat bed without using bedrails?: A Little Help needed moving from lying on your back to sitting on the side of a flat bed without using bedrails?: A Little Help needed moving to and from a bed to a chair (including a wheelchair)?: A Little Help needed standing up from a chair using your arms (e.g., wheelchair or bedside chair)?: A Little Help needed to walk in hospital room?: A Lot Help needed climbing 3-5 steps with a railing? : A Lot 6 Click Score: 16    End of Session Equipment Utilized During Treatment: Gait belt Activity Tolerance: Other (comment) (Limited by nausea and vomiting) Patient left: in bed;with call bell/phone within reach;with bed alarm  set;with nursing/sitter in room Nurse Communication: Mobility status PT Visit Diagnosis: Difficulty in walking, not elsewhere classified (R26.2);Other abnormalities of gait and mobility (R26.89);Muscle weakness (generalized) (M62.81)    Time: 8937-3428 PT Time Calculation (min) (ACUTE ONLY): 22 min   Charges:   PT Evaluation $PT Eval Low Complexity: 1 Low PT Treatments $Therapeutic Activity: 8-22 mins        Raman Featherston M. Fairly IV, PT, DPT Physical Therapist- [facility]  02/04/2021, 3:22 PM

## 2021-02-05 ENCOUNTER — Encounter: Payer: Self-pay | Admitting: Orthopedic Surgery

## 2021-02-05 DIAGNOSIS — M1712 Unilateral primary osteoarthritis, left knee: Secondary | ICD-10-CM | POA: Diagnosis not present

## 2021-02-05 LAB — BASIC METABOLIC PANEL
Anion gap: 6 (ref 5–15)
BUN: 11 mg/dL (ref 8–23)
CO2: 25 mmol/L (ref 22–32)
Calcium: 8.1 mg/dL — ABNORMAL LOW (ref 8.9–10.3)
Chloride: 105 mmol/L (ref 98–111)
Creatinine, Ser: 0.62 mg/dL (ref 0.44–1.00)
GFR, Estimated: >60 mL/min (ref >60)
Glucose, Bld: 134 mg/dL — ABNORMAL HIGH (ref 70–99)
Potassium: 4.6 mmol/L (ref 3.5–5.1)
Sodium: 136 mmol/L (ref 135–145)

## 2021-02-05 LAB — CBC
HCT: 32.9 % — ABNORMAL LOW (ref 36.0–46.0)
Hemoglobin: 10.6 g/dL — ABNORMAL LOW (ref 12.0–15.0)
MCH: 29.3 pg (ref 26.0–34.0)
MCHC: 32.2 g/dL (ref 30.0–36.0)
MCV: 90.9 fL (ref 80.0–100.0)
Platelets: 179 10*3/uL (ref 150–400)
RBC: 3.62 MIL/uL — ABNORMAL LOW (ref 3.87–5.11)
RDW: 12.1 % (ref 11.5–15.5)
WBC: 11.2 10*3/uL — ABNORMAL HIGH (ref 4.0–10.5)
nRBC: 0 % (ref 0.0–0.2)

## 2021-02-05 MED ORDER — HYDROCODONE-ACETAMINOPHEN 7.5-325 MG PO TABS
ORAL_TABLET | ORAL | Status: AC
  Filled 2021-02-05: qty 2

## 2021-02-05 MED ORDER — POLYETHYLENE GLYCOL 3350 17 G PO PACK: 17.0000 g | PACK | Freq: Every day | ORAL | 0 refills | Status: DC | PRN

## 2021-02-05 MED ORDER — TRAMADOL HCL 50 MG PO TABS
ORAL_TABLET | ORAL | Status: AC
  Filled 2021-02-05: qty 1

## 2021-02-05 MED ORDER — TRAMADOL HCL 50 MG PO TABS
ORAL_TABLET | ORAL | Status: AC
  Administered 2021-02-05: 50 mg via ORAL
  Filled 2021-02-05: qty 1

## 2021-02-05 MED ORDER — ONDANSETRON HCL 4 MG PO TABS
ORAL_TABLET | ORAL | Status: AC
  Administered 2021-02-05: 4 mg via ORAL
  Filled 2021-02-05: qty 1

## 2021-02-05 MED ORDER — METHOCARBAMOL 500 MG PO TABS
ORAL_TABLET | ORAL | Status: AC
  Filled 2021-02-05: qty 1

## 2021-02-05 MED ORDER — METHOCARBAMOL 500 MG PO TABS
ORAL_TABLET | ORAL | Status: AC
  Administered 2021-02-05: 500 mg via ORAL
  Filled 2021-02-05: qty 1

## 2021-02-05 MED ORDER — ASPIRIN 81 MG PO CHEW
CHEWABLE_TABLET | ORAL | Status: AC
  Administered 2021-02-05: 81 mg via ORAL
  Filled 2021-02-05: qty 1

## 2021-02-05 MED ORDER — DOCUSATE SODIUM 100 MG PO CAPS: 100.0000 mg | ORAL_CAPSULE | Freq: Two times a day (BID) | ORAL | 0 refills | Status: DC

## 2021-02-05 MED ORDER — TRAMADOL HCL 50 MG PO TABS
50.0000 mg | ORAL_TABLET | Freq: Four times a day (QID) | ORAL | 0 refills | Status: DC
Start: 2021-02-05 — End: 2022-05-01

## 2021-02-05 MED ORDER — METHOCARBAMOL 500 MG PO TABS: 500.0000 mg | ORAL_TABLET | Freq: Four times a day (QID) | ORAL | 0 refills | Status: DC | PRN

## 2021-02-05 MED ORDER — HYDROCODONE-ACETAMINOPHEN 7.5-325 MG PO TABS
ORAL_TABLET | ORAL | Status: AC
  Administered 2021-02-05: 2 via ORAL
  Filled 2021-02-05: qty 1

## 2021-02-05 MED ORDER — HYDROCODONE-ACETAMINOPHEN 5-325 MG PO TABS: 1.0000 | ORAL_TABLET | ORAL | 0 refills | Status: DC | PRN

## 2021-02-05 MED ORDER — METOPROLOL TARTRATE 50 MG PO TABS
ORAL_TABLET | ORAL | Status: AC
  Administered 2021-02-05: 50 mg via ORAL
  Filled 2021-02-05: qty 1

## 2021-02-05 NOTE — Progress Notes (Signed)
Physical Therapy Treatment Patient Details Name: Judy Prince MRN: [id number] DOB: [birth year] Today's Date: 02/05/2021   History of Present Illness 65yo female s/p L TKA. PMHx includes sleep apnea on CPAP, HTN, CAD, hx cardiac stents, anxiety, fibromyalgia, GERD, HLD, IBS, and OA.    PT Comments    Pt upright in bed agreeable for PT afternoon session. Pt able to sit EOB without PT assist and stand with supervision to RW with safe hand placement. Tolerated 110' of amb at slow and cautious pace endorsing less pain than previous session. Returned to recliner with excellent use of RW and safe hand placement returning to sitting. Pt is most limited in flexion AROM due to pain requiring encouragement and AAROM to perform knee bends only tolerating minimal bending of knee due to pain. PT reports good understanding of HEP which was provided prior to D/c. Pt will benefit from [facility] PT to progress knee AROM and strengthening to return to PLOF.    Recommendations for follow up therapy are one component of a multi-disciplinary discharge planning process, led by the attending physician.  Recommendations may be updated based on patient status, additional functional criteria and insurance authorization.  Follow Up Recommendations  Home health PT     Assistance Recommended at Discharge Intermittent Supervision/Assistance  Equipment Recommendations  Rolling walker (2 wheels);BSC/3in1    Recommendations for Other Services       Precautions / Restrictions Precautions Precautions: Fall Restrictions Weight Bearing Restrictions: Yes LLE Weight Bearing: Weight bearing as tolerated     Mobility  Bed Mobility Overal bed mobility: Needs Assistance Bed Mobility: Supine to Sit;Sit to Supine     Supine to sit: Min assist;HOB elevated Sit to supine: Min assist   General bed mobility comments: In recliner pre and post session Patient Response: Cooperative  Transfers Overall transfer level: Needs  assistance Equipment used: Rolling walker (2 wheels) Transfers: Sit to/from Stand Sit to Stand: Min guard           General transfer comment: cues for hand placement sequencing    Ambulation/Gait Ambulation/Gait assistance: Supervision Gait Distance (Feet): 110 Feet Assistive device: Rolling walker (2 wheels) Gait Pattern/deviations: Step-through pattern;Decreased stance time - left           Stairs             Wheelchair Mobility    Modified Rankin (Stroke Patients Only)       Balance Overall balance assessment: Needs assistance Sitting-balance support: No upper extremity supported;Feet supported Sitting balance-Leahy Scale: Good     Standing balance support: Bilateral upper extremity supported;During functional activity Standing balance-Leahy Scale: Fair Standing balance comment: UE support for fxl mobility, able to alternate for static stand                            Cognition Arousal/Alertness: Awake/alert Behavior During Therapy: WFL for tasks assessed/performed Overall Cognitive Status: Within Functional Limits for tasks assessed                                 General Comments: A&O x4, requires increased time and encouragement to motivate        Exercises Total Joint Exercises Ankle Circles/Pumps: AROM;Both;10 reps Quad Sets: AROM;Left;Supine;10 reps Short Arc Quad: AROM;Strengthening;Seated;Left;10 reps Heel Slides: AAROM;Left;5 reps;Supine Hip ABduction/ADduction: AROM;Left;10 reps;Supine Straight Leg Raises: AAROM;Strengthening;Left;10 reps;Supine Long Arc Quad: AROM;Seated;Left;10 reps Goniometric ROM: 5-30 Other Exercises Other  Exercises: Adjusted personal RW appropriate height for pt    General Comments        Pertinent Vitals/Pain Pain Assessment: 0-10 Pain Score: 6  Pain Location: L knee Pain Descriptors / Indicators: Aching Pain Intervention(s): Limited activity within patient's  tolerance;Repositioned;Ice applied;Monitored during session    Home Living                          Prior Function            PT Goals (current goals can now be found in the care plan section) Acute Rehab PT Goals Patient Stated Goal: to go home PT Goal Formulation: With patient Time For Goal Achievement: 02/18/21 Potential to Achieve Goals: Good Progress towards PT goals: Progressing toward goals    Frequency    BID      PT Plan Current plan remains appropriate    Co-evaluation              AM-PAC PT "6 Clicks" Mobility   Outcome Measure  Help needed turning from your back to your side while in a flat bed without using bedrails?: A Little Help needed moving from lying on your back to sitting on the side of a flat bed without using bedrails?: A Little Help needed moving to and from a bed to a chair (including a wheelchair)?: A Little Help needed standing up from a chair using your arms (e.g., wheelchair or bedside chair)?: A Little Help needed to walk in hospital room?: A Little Help needed climbing 3-5 steps with a railing? : A Lot 6 Click Score: 17    End of Session Equipment Utilized During Treatment: Gait belt Activity Tolerance: Patient tolerated treatment well Patient left: in chair;with call bell/phone within reach;with SCD's reapplied Nurse Communication: Mobility status PT Visit Diagnosis: Difficulty in walking, not elsewhere classified (R26.2);Other abnormalities of gait and mobility (R26.89);Muscle weakness (generalized) (M62.81)     Time: 5945-8592 PT Time Calculation (min) (ACUTE ONLY): 25 min  Charges:  $Gait Training: 8-22 mins $Therapeutic Exercise: 8-22 mins                     Aviendha Azbell M. Fairly IV, PT, DPT Physical Therapist- [facility]  02/05/2021, 4:01 PM

## 2021-02-05 NOTE — Progress Notes (Signed)
Occupational Therapy Treatment Patient Details Name: Judy Prince MRN: [id number] DOB: March 15, 1956 Today's Date: 02/05/2021   History of present illness 65yo female s/p L TKA. PMHx includes sleep apnea on CPAP, HTN, CAD, hx cardiac stents, anxiety, fibromyalgia, GERD, HLD, IBS, and OA.   OT comments  Pt seen for OT Tx this date to f/u re: safety with ADLs/ADL mobility. Pt requires encouragement for OOB activity as she is requesting bed pan for toileting. OT educates pt re: importance of OOB activity esp post-op from elective surgery. Pt agreeable to amb ~10' to [facility] and compromises with OT. OT engages pt in STS with MIN A and pt demos G sitting balance. She requires CGA as well as cues and rationale for safe sequencing of hand placement to CTS using RW. OT engages pt in fxl mobility ~10' to [facility] with cues for safety with pivoting. Pt requires SETUP for seated peri care. She is able to perform hand hygiene with SETUP in sitting. Requires CGA to CTS from commode and perform fxl mobility back to bed with RW. Pt left in bed with all needs met and in reach. Continue ot anticipate that pt will require [place] f/u.    Recommendations for follow up therapy are one component of a multi-disciplinary discharge planning process, led by the attending physician.  Recommendations may be updated based on patient status, additional functional criteria and insurance authorization.    Follow Up Recommendations  Home health OT    Assistance Recommended at Discharge PRN  Equipment Recommendations  BSC/3in1;Other (comment) (2ww)    Recommendations for Other Services      Precautions / Restrictions Precautions Precautions: Fall Restrictions Weight Bearing Restrictions: Yes LLE Weight Bearing: Weight bearing as tolerated       Mobility Bed Mobility Overal bed mobility: Needs Assistance Bed Mobility: Supine to Sit;Sit to Supine     Supine to sit: Min assist;HOB elevated Sit to supine: Min assist   General  bed mobility comments: encouragement for OOB    Transfers Overall transfer level: Needs assistance Equipment used: Rolling walker (2 wheels) Transfers: Sit to/from Stand Sit to Stand: Min guard           General transfer comment: cues for hand placement sequencing     Balance Overall balance assessment: Needs assistance Sitting-balance support: No upper extremity supported;Feet supported Sitting balance-Leahy Scale: Good     Standing balance support: Bilateral upper extremity supported;During functional activity Standing balance-Leahy Scale: Fair Standing balance comment: UE support for fxl mobility, able to alternate for static stand                           ADL either performed or assessed with clinical judgement   ADL Overall ADL's : Needs assistance/impaired     Grooming: Wash/dry hands;Set up;Sitting                   Toilet Transfer: Min guard;Ambulation;Rolling walker (2 wheels);BSC/3in1   Toileting- Clothing Manipulation and Hygiene: Set up;Sitting/lateral lean       Functional mobility during ADLs: Min guard;Rolling walker (2 wheels) (to take ~6-7 steps to/from bed/BSC)      Extremity/Trunk Assessment Upper Extremity Assessment Upper Extremity Assessment: Overall WFL for tasks assessed   Lower Extremity Assessment Lower Extremity Assessment: RLE deficits/detail;LLE deficits/detail RLE Deficits / Details: WFL LLE Deficits / Details: sensation WFL, some continued post op pain and limited ROM as it pertains to LB ADLs  Vision       Perception     Praxis      Cognition Arousal/Alertness: Awake/alert Behavior During Therapy: WFL for tasks assessed/performed Overall Cognitive Status: Within Functional Limits for tasks assessed                                 General Comments: A&O x4, requires increased time and encouragement to motivate          Exercises Total Joint Exercises Ankle Circles/Pumps:  AROM;Both;10 reps Quad Sets: AROM;Left;Supine;10 reps Short Arc Quad: AROM;Strengthening;Seated;Left;10 reps Heel Slides: AAROM;Left;5 reps;Supine Hip ABduction/ADduction: AROM;Left;10 reps;Supine Straight Leg Raises: AAROM;Strengthening;Left;10 reps;Supine Other Exercises Other Exercises: OT engaes pt in ed re: importance of OOB activity including even toileting as it will be a missed opportunity for mobilization multiple times a day if pt requests bed pan rather than getting up to commode which is recommended.   Shoulder Instructions       General Comments      Pertinent Vitals/ Pain       Pain Assessment: 0-10 Pain Score: 6  Pain Location: L knee Pain Descriptors / Indicators: Aching Pain Intervention(s): Limited activity within patient's tolerance;Monitored during session;Repositioned;Ice applied  Home Living                                          Prior Functioning/Environment              Frequency  Min 2X/week        Progress Toward Goals  OT Goals(current goals can now be found in the care plan section)  Progress towards OT goals: Progressing toward goals  Acute Rehab OT Goals Patient Stated Goal: to get stronger OT Goal Formulation: With patient Time For Goal Achievement: 02/18/21 Potential to Achieve Goals: Good  Plan Discharge plan remains appropriate    Co-evaluation                 AM-PAC OT "6 Clicks" Daily Activity     Outcome Measure   Help from another person eating meals?: None Help from another person taking care of personal grooming?: None Help from another person toileting, which includes using toliet, bedpan, or urinal?: A Lot Help from another person bathing (including washing, rinsing, drying)?: A Little Help from another person to put on and taking off regular upper body clothing?: None Help from another person to put on and taking off regular lower body clothing?: A Little 6 Click Score: 20    End of  Session Equipment Utilized During Treatment: Gait belt;Rolling walker (2 wheels)  OT Visit Diagnosis: Other abnormalities of gait and mobility (R26.89);History of falling (Z91.81);Pain Pain - Right/Left: Left Pain - part of body: Knee   Activity Tolerance Patient tolerated treatment well   Patient Left in bed;with call bell/phone within reach;with SCD's reapplied;Other (comment)   Nurse Communication Mobility status        Time: [phone] OT Time Calculation (min): 26 min  Charges: OT General Charges $OT Visit: 1 Visit OT Treatments $Self Care/Home Management : 8-22 mins $Therapeutic Activity: 8-22 mins  Gerrianne Scale, MS, OTR/L ascom [phone] 02/05/21, 3:47 PM

## 2021-02-05 NOTE — Progress Notes (Signed)
   Subjective: 1 Day Post-Op Procedure(s) (LRB): TOTAL KNEE ARTHROPLASTY (Left) Patient reports pain as moderate.   Patient is well, and has had no acute complaints or problems Denies any CP, SOB, ABD pain. We will continue therapy today.  Plan is to go Home after hospital stay.  Objective: Vital signs in last 24 hours: Temp:  [97.4 F (36.3 C)-99.3 F (37.4 C)] 97.8 F (36.6 C) (11/09 0756) Pulse Rate:  [49-85] 75 (11/09 0756) Resp:  [13-26] 16 (11/09 0756) BP: (111-159)/(50-95) 118/60 (11/09 0756) SpO2:  [95 %-100 %] 100 % (11/09 0756) Weight:  [78.9 kg] 78.9 kg (11/08 0930)  Intake/Output from previous day: 11/08 0701 - 11/09 0700 In: 1778.1 [P.O.:270; I.V.:1208.1; IV Piggyback:300] Out: 1500 [Urine:1450; Blood:50] Intake/Output this shift: No intake/output data recorded.  Recent Labs    02/05/21 0246  HGB 10.6*   Recent Labs    02/05/21 0246  WBC 11.2*  RBC 3.62*  HCT 32.9*  PLT 179   Recent Labs    02/05/21 0246  NA 136  K 4.6  CL 105  CO2 25  BUN 11  CREATININE 0.62  GLUCOSE 134*  CALCIUM 8.1*   No results for input(s): LABPT, INR in the last 72 hours.  EXAM General - Patient is Alert, Appropriate, and Oriented Extremity - Neurovascular intact Sensation intact distally Intact pulses distally Dorsiflexion/Plantar flexion intact Dressing - dressing C/D/I and no drainage Motor Function - intact, moving foot and toes well on exam.   Past Medical History:  Diagnosis Date   Anxiety    Arthritis    Basal cell carcinoma (BCC) of forearm    Coronary artery disease    a.) LHC 01/21/2006: 100 % pRCA, 100% mRCA, 100% dRCA. PCI peformed; 69% mRCA - PCI complicated by spiral dissection --> 3.0x 88mm Cypher DES to dRCA, 3.0x47mm Cypher DES to mRCA, and 3.5x23 mm Cypher DES pRCA. b.) 07/2020 MV: EF>65%, no ischemia/infarct.   Cowden disease    Diastolic dysfunction    a.) 07/2020 Echo: EF 60-65%, no rwma, G1DD, nl RV fxn. Mildly dil LA. Triv MR.    Fibromyalgia    Gastritis    GERD (gastroesophageal reflux disease)    Hemorrhoids    History of kidney stones    Hyperlipidemia LDL goal <70    Hypertension    IBS (irritable bowel syndrome)    Lichen planus    Long term (current) use of antithrombotics/antiplatelets    a.) DAPT (ASA + clopidogrel)   Morbid obesity (HCC)    OSA (obstructive sleep apnea)    a.) "waiting on CPAP machine" as of 01/22/2021   Osteoporosis    Panic disorder     Assessment/Plan:   1 Day Post-Op Procedure(s) (LRB): TOTAL KNEE ARTHROPLASTY (Left) Active Problems:   S/P TKR (total knee replacement) using cement, left  Estimated body mass index is 31.83 kg/m as calculated from the following:   Height as of this encounter: 5\' 2"  (1.575 m).   Weight as of this encounter: 78.9 kg. Advance diet Up with therapy Work on [employer] and VSS Pain controlled CM to assist with discharge to home with HHPT pending completion of PT goals  DVT Prophylaxis - Foot Pumps, TED hose, and Aspirin plavix Weight-Bearing as tolerated to left leg   T. Rachelle Hora, PA-C [place] 02/05/2021, 8:21 AM

## 2021-02-05 NOTE — Progress Notes (Signed)
Nsg Discharge Note  Admit Date:  02/04/2021 Discharge date: 02/05/2021   Judy Prince to be D/C'd Home per MD order.  AVS completed. Patient/caregiver able to verbalize understanding.  Discharge Medication: Allergies as of 02/05/2021       Reactions   Molds & Smuts    Other         Medication List     STOP taking these medications    acetaminophen 500 MG tablet Commonly known as: TYLENOL   aspirin EC 81 MG tablet       TAKE these medications    amoxicillin-clavulanate 875-125 MG tablet Commonly known as: AUGMENTIN Take 1 tablet by mouth 2 (two) times daily.   b complex vitamins capsule Take 1 capsule by mouth daily.   BIOFREEZE EX Apply 1 application topically daily as needed (joint pain).   clopidogrel 75 MG tablet Commonly known as: PLAVIX Take 75 mg by mouth daily.   docusate sodium 100 MG capsule Commonly known as: COLACE Take 1 capsule (100 mg total) by mouth 2 (two) times daily.   doxycycline 50 MG capsule Commonly known as: MONODOX Take 50 mg by mouth daily.   ezetimibe 10 MG tablet Commonly known as: ZETIA Take 1 tablet (10 mg total) by mouth daily.   famotidine 20 MG tablet Commonly known as: PEPCID Take 40 mg by mouth at bedtime.   fluticasone 50 MCG/ACT nasal spray Commonly known as: FLONASE Place 2 sprays into both nostrils daily.   HYDROcodone-acetaminophen 5-325 MG tablet Commonly known as: NORCO/VICODIN Take 1-2 tablets by mouth every 4 (four) hours as needed for moderate pain (pain score 4-6).   losartan 25 MG tablet Commonly known as: COZAAR Take 1 tablet (25 mg total) by mouth daily.   methocarbamol 500 MG tablet Commonly known as: ROBAXIN Take 1 tablet (500 mg total) by mouth every 6 (six) hours as needed for muscle spasms.   metoprolol tartrate 50 MG tablet Commonly known as: LOPRESSOR Take 50 mg by mouth 2 (two) times daily.   pantoprazole 40 MG tablet Commonly known as: PROTONIX Take 40 mg by mouth daily.    polyethylene glycol 17 g packet Commonly known as: MIRALAX / GLYCOLAX Take 17 g by mouth daily as needed for mild constipation.   rosuvastatin 20 MG tablet Commonly known as: CRESTOR Take 20 mg by mouth every evening.   sertraline 50 MG tablet Commonly known as: ZOLOFT Take 50 mg by mouth daily before lunch.   traMADol 50 MG tablet Commonly known as: ULTRAM Take 1 tablet (50 mg total) by mouth every 6 (six) hours.               Durable Medical Equipment  (From admission, onward)           Start     Ordered   02/04/21 0946  DME Walker rolling  Once       Question Answer Comment  Walker: With 5 Inch Wheels   Patient needs a walker to treat with the following condition S/P TKR (total knee replacement) using cement, left      02/04/21 0945   02/04/21 0946  DME 3 n 1  Once        02/04/21 0945   02/04/21 0946  DME Bedside commode  Once       Question:  Patient needs a bedside commode to treat with the following condition  Answer:  S/P TKR (total knee replacement) using cement, left   02/04/21 0945  Discharge Assessment: Vitals:   02/05/21 0756 02/05/21 1151  BP: 118/60 (!) 166/68  Pulse: 75 79  Resp: 16 14  Temp: 97.8 F (36.6 C) 97.9 F (36.6 C)  SpO2: 100% 98%   Skin clean, dry and intact without evidence of skin break down, no evidence of skin tears noted. IV catheter discontinued intact. Site without signs and symptoms of complications - no redness or edema noted at insertion site, patient denies c/o pain - only slight tenderness at site.  Dressing with slight pressure applied.  D/c Instructions-Education: Discharge instructions given to patient/family with verbalized understanding. D/c education completed with patient/family including follow up instructions, medication list, d/c activities limitations if indicated, with other d/c instructions as indicated by MD - patient able to verbalize understanding, all questions fully  answered. Patient instructed to return to ED, call 911, or call MD for any changes in condition.  Patient escorted via [place], and D/C home via private auto.  Tresa Endo, RN 02/05/2021 2:47 PM

## 2021-02-05 NOTE — Discharge Instructions (Signed)

## 2021-02-05 NOTE — Progress Notes (Signed)
Physical Therapy Treatment Patient Details Name: Judy Prince MRN: [id number] DOB: January 14, 1956 Today's Date: 02/05/2021   History of Present Illness 65yo female s/p L TKA. PMHx includes sleep apnea on CPAP, HTN, CAD, hx cardiac stents, anxiety, fibromyalgia, GERD, HLD, IBS, and OA.    PT Comments    Pt received supine in bed agreeable to PT. Reports L knee pain 6/10 NPS. Began session in supine with LLE therex for AROM and strengthening to improve L knee mobility 2/2 having ice on knee. Requires AAROM with very guarded knee flexion due to pain along with SLR. MinA provided to transfer from supine to sitting EoB and minguard standing to RW. Pt requires education and cuing for safe transfers with hand placement with standing to RW. Tolerated ambulating 54' in two bouts with toielting between bouts with supervision provided. Utilization of NT to assist pt in toileting for safety. Antalgic gait noticed on LLE but able to progress to step through pattern. Pt requires further education on safe transfers to sitting in recliner for RW management and hand placement. Overall pt is displaying safe ability to transfer and ambulate household distances with no apparent balance concerns besides minor education on RW safety techniques. D/c recs remain appropriate.     Recommendations for follow up therapy are one component of a multi-disciplinary discharge planning process, led by the attending physician.  Recommendations may be updated based on patient status, additional functional criteria and insurance authorization.  Follow Up Recommendations  Home health PT     Assistance Recommended at Discharge Intermittent Supervision/Assistance  Equipment Recommendations  Rolling walker (2 wheels);BSC/3in1    Recommendations for Other Services       Precautions / Restrictions Precautions Precautions: Fall Restrictions Weight Bearing Restrictions: Yes LLE Weight Bearing: Weight bearing as tolerated      Mobility  Bed Mobility Overal bed mobility: Needs Assistance Bed Mobility: Supine to Sit     Supine to sit: HOB elevated;Min assist     General bed mobility comments: Very minimal assist provided for LLE. More so for friction reduction 2/2 grippy socks on sheets Patient Response: Cooperative  Transfers Overall transfer level: Needs assistance Equipment used: Rolling walker (2 wheels) Transfers: Sit to/from Stand Sit to Stand: Min guard                Ambulation/Gait Ambulation/Gait assistance: Supervision Gait Distance (Feet): 108 Feet Assistive device: Rolling walker (2 wheels) Gait Pattern/deviations: Step-through pattern;Decreased stance time - left           Stairs             Wheelchair Mobility    Modified Rankin (Stroke Patients Only)       Balance Overall balance assessment: Needs assistance Sitting-balance support: No upper extremity supported;Feet supported Sitting balance-Leahy Scale: Good     Standing balance support: Bilateral upper extremity supported;During functional activity;Reliant on assistive device for balance Standing balance-Leahy Scale: Fair Standing balance comment: Relies on BUE support on RW                            Cognition Arousal/Alertness: Awake/alert Behavior During Therapy: WFL for tasks assessed/performed Overall Cognitive Status: Within Functional Limits for tasks assessed                                 General Comments: No nausea or vomiting today  Exercises Total Joint Exercises Ankle Circles/Pumps: AROM;Both;10 reps Quad Sets: AROM;Left;Supine;10 reps Short Arc Quad: AROM;Strengthening;Seated;Left;10 reps Heel Slides: AAROM;Left;5 reps;Supine Hip ABduction/ADduction: AROM;Left;10 reps;Supine Straight Leg Raises: AAROM;Strengthening;Left;10 reps;Supine Other Exercises Other Exercises: Safe transfer techniques with RW    General Comments        Pertinent  Vitals/Pain Pain Assessment: 0-10 Pain Score: 6  Pain Location: L knee Pain Descriptors / Indicators: Aching Pain Intervention(s): Limited activity within patient's tolerance;Monitored during session;Repositioned;Ice applied    Home Living                          Prior Function            PT Goals (current goals can now be found in the care plan section) Acute Rehab PT Goals Patient Stated Goal: to go home PT Goal Formulation: With patient Time For Goal Achievement: 02/18/21 Potential to Achieve Goals: Good Progress towards PT goals: Progressing toward goals    Frequency    BID      PT Plan Current plan remains appropriate    Co-evaluation              AM-PAC PT "6 Clicks" Mobility   Outcome Measure  Help needed turning from your back to your side while in a flat bed without using bedrails?: A Little Help needed moving from lying on your back to sitting on the side of a flat bed without using bedrails?: A Little Help needed moving to and from a bed to a chair (including a wheelchair)?: A Little Help needed standing up from a chair using your arms (e.g., wheelchair or bedside chair)?: A Little Help needed to walk in hospital room?: A Little Help needed climbing 3-5 steps with a railing? : A Lot 6 Click Score: 17    End of Session Equipment Utilized During Treatment: Gait belt Activity Tolerance: Patient tolerated treatment well Patient left: in chair;with call bell/phone within reach;with SCD's reapplied Nurse Communication: Mobility status PT Visit Diagnosis: Difficulty in walking, not elsewhere classified (R26.2);Other abnormalities of gait and mobility (R26.89);Muscle weakness (generalized) (M62.81)     Time: 0947-0962 PT Time Calculation (min) (ACUTE ONLY): 39 min  Charges:  $Gait Training: 23-37 mins $Therapeutic Exercise: 8-22 mins                     Judy Prince M. Fairly IV, PT, DPT Physical Therapist- [facility]  02/05/2021, 12:07 PM

## 2021-02-05 NOTE — Discharge Summary (Signed)
Physician Discharge Summary  Patient ID: Judy Prince MRN: [id number] DOB/AGE: 05/24/1955 65 y.o.  Admit date: 02/04/2021 Discharge date: 02/05/2021  Admission Diagnoses:  S/P TKR (total knee replacement) using cement, left [Z96.652]   Discharge Diagnoses: Patient Active Problem List   Diagnosis Date Noted   S/P TKR (total knee replacement) using cement, left 02/04/2021   Combined fat and carbohydrate induced hyperlipemia 07/02/2015   Arteriosclerosis of coronary artery 12/20/2013   Hyperlipidemia LDL goal <70 12/20/2013   OP (osteoporosis) 12/20/2013   Avitaminosis D 12/20/2013   Impingement syndrome of shoulder 11/29/2013    Past Medical History:  Diagnosis Date   Anxiety    Arthritis    Basal cell carcinoma (BCC) of forearm    Coronary artery disease    a.) LHC 01/21/2006: 100 % pRCA, 100% mRCA, 100% dRCA. PCI peformed; 70% mRCA - PCI complicated by spiral dissection --> 3.0x 86mm Cypher DES to dRCA, 3.0x20mm Cypher DES to mRCA, and 3.5x23 mm Cypher DES pRCA. b.) 07/2020 MV: EF>65%, no ischemia/infarct.   Cowden disease    Diastolic dysfunction    a.) 07/2020 Echo: EF 60-65%, no rwma, G1DD, nl RV fxn. Mildly dil LA. Triv MR.   Fibromyalgia    Gastritis    GERD (gastroesophageal reflux disease)    Hemorrhoids    History of kidney stones    Hyperlipidemia LDL goal <70    Hypertension    IBS (irritable bowel syndrome)    Lichen planus    Long term (current) use of antithrombotics/antiplatelets    a.) DAPT (ASA + clopidogrel)   Morbid obesity (HCC)    OSA (obstructive sleep apnea)    a.) "waiting on CPAP machine" as of 01/22/2021   Osteoporosis    Panic disorder      Transfusion: none   Consultants (if any):   Discharged Condition: Improved  Hospital Course: Judy Prince is an 65 y.o. female who was admitted 02/04/2021 with a diagnosis of left knee osteoarthritis and went to the operating room on 02/04/2021 and underwent the above named procedures.    Surgeries:  Procedure(s): TOTAL KNEE ARTHROPLASTY on 02/04/2021 Patient tolerated the surgery well. Taken to PACU where she was stabilized and then transferred to the orthopedic floor.  Started on aspirin plavix. Foot pumps applied bilaterally at 80 mm. Heels elevated on bed with rolled towels. No evidence of DVT. Negative Homan. Physical therapy started on day #1 for gait training and transfer. OT started day #1 for ADL and assisted devices.  Patient's foley was d/c on day #1. Patient's IV  was d/c on day #1.  On post op day #1 patient was stable and ready for discharge to home with HHPT.  Implants: Medacta  GMK sphere system with left 3 femur, left 3 tibia with short stem and 11 mm insert.  Size 2 patella, all components cemented.    She was given perioperative antibiotics:  Anti-infectives (From admission, onward)    Start     Dose/Rate Route Frequency Ordered Stop   02/04/21 2127  ceFAZolin (ANCEF) 2-4 GM/100ML-% IVPB       Note to Pharmacy: Lyman Bishop   : cabinet override      02/04/21 2127 02/04/21 2134   02/04/21 1330  ceFAZolin (ANCEF) IVPB 2g/100 mL premix        2 g 200 mL/hr over 30 Minutes Intravenous Every 6 hours 02/04/21 0945 02/04/21 2230   02/04/21 1000  doxycycline (MONODOX) capsule 50 mg  50 mg Oral Daily 02/04/21 0945     02/04/21 0616  ceFAZolin (ANCEF) 2-4 GM/100ML-% IVPB       Note to Pharmacy: Trudie Reed   : cabinet override      02/04/21 0616 02/04/21 2230   02/04/21 0600  ceFAZolin (ANCEF) IVPB 2g/100 mL premix        2 g 200 mL/hr over 30 Minutes Intravenous On call to O.R. 02/04/21 0217 02/04/21 0727     .  She was given sequential compression devices, early ambulation, and Aspirin PLavix TEDs for DVT prophylaxis.  She benefited maximally from the hospital stay and there were no complications.    Recent vital signs:  Vitals:   02/05/21 0756 02/05/21 1151  BP: 118/60 (!) 166/68  Pulse: 75 79  Resp: 16 14  Temp: 97.8 F (36.6 C) 97.9 F (36.6  C)  SpO2: 100% 98%    Recent laboratory studies:  Lab Results  Component Value Date   HGB 10.6 (L) 02/05/2021   HGB 13.8 01/22/2021   HGB 14.6 03/26/2016   Lab Results  Component Value Date   WBC 11.2 (H) 02/05/2021   PLT 179 02/05/2021   No results found for: INR Lab Results  Component Value Date   NA 136 02/05/2021   K 4.6 02/05/2021   CL 105 02/05/2021   CO2 25 02/05/2021   BUN 11 02/05/2021   CREATININE 0.62 02/05/2021   GLUCOSE 134 (H) 02/05/2021    Discharge Medications:   Allergies as of 02/05/2021       Reactions   Molds & Smuts    Other         Medication List     STOP taking these medications    acetaminophen 500 MG tablet Commonly known as: TYLENOL   aspirin EC 81 MG tablet       TAKE these medications    amoxicillin-clavulanate 875-125 MG tablet Commonly known as: AUGMENTIN Take 1 tablet by mouth 2 (two) times daily.   b complex vitamins capsule Take 1 capsule by mouth daily.   BIOFREEZE EX Apply 1 application topically daily as needed (joint pain).   clopidogrel 75 MG tablet Commonly known as: PLAVIX Take 75 mg by mouth daily.   docusate sodium 100 MG capsule Commonly known as: COLACE Take 1 capsule (100 mg total) by mouth 2 (two) times daily.   doxycycline 50 MG capsule Commonly known as: MONODOX Take 50 mg by mouth daily.   ezetimibe 10 MG tablet Commonly known as: ZETIA Take 1 tablet (10 mg total) by mouth daily.   famotidine 20 MG tablet Commonly known as: PEPCID Take 40 mg by mouth at bedtime.   fluticasone 50 MCG/ACT nasal spray Commonly known as: FLONASE Place 2 sprays into both nostrils daily.   HYDROcodone-acetaminophen 5-325 MG tablet Commonly known as: NORCO/VICODIN Take 1-2 tablets by mouth every 4 (four) hours as needed for moderate pain (pain score 4-6).   losartan 25 MG tablet Commonly known as: COZAAR Take 1 tablet (25 mg total) by mouth daily.   methocarbamol 500 MG tablet Commonly known as:  ROBAXIN Take 1 tablet (500 mg total) by mouth every 6 (six) hours as needed for muscle spasms.   metoprolol tartrate 50 MG tablet Commonly known as: LOPRESSOR Take 50 mg by mouth 2 (two) times daily.   pantoprazole 40 MG tablet Commonly known as: PROTONIX Take 40 mg by mouth daily.   polyethylene glycol 17 g packet Commonly known as: MIRALAX / GLYCOLAX Take  17 g by mouth daily as needed for mild constipation.   rosuvastatin 20 MG tablet Commonly known as: CRESTOR Take 20 mg by mouth every evening.   sertraline 50 MG tablet Commonly known as: ZOLOFT Take 50 mg by mouth daily before lunch.   traMADol 50 MG tablet Commonly known as: ULTRAM Take 1 tablet (50 mg total) by mouth every 6 (six) hours.               Durable Medical Equipment  (From admission, onward)           Start     Ordered   02/04/21 0946  DME Walker rolling  Once       Question Answer Comment  Walker: With 5 Inch Wheels   Patient needs a walker to treat with the following condition S/P TKR (total knee replacement) using cement, left      02/04/21 0945   02/04/21 0946  DME 3 n 1  Once        02/04/21 0945   02/04/21 0946  DME Bedside commode  Once       Question:  Patient needs a bedside commode to treat with the following condition  Answer:  S/P TKR (total knee replacement) using cement, left   02/04/21 0945            Diagnostic Studies: DG Knee 1-2 Views Left  Result Date: 02/04/2021 CLINICAL DATA:  Left knee surgery EXAM: LEFT KNEE - 1-2 VIEW COMPARISON:  01/04/2020 FINDINGS: Interval postsurgical changes from left total knee arthroplasty. Arthroplasty components are in their expected alignment. No periprosthetic fracture or evidence of other complication. Expected postoperative changes within the overlying soft tissues. IMPRESSION: Interval postsurgical changes from left total knee arthroplasty. Electronically Signed   By: Davina Poke D.O.   On: 02/04/2021 09:48    Disposition:       Follow-up Information     Duanne Guess, PA-C Follow up in 2 week(s).   Specialties: Orthopedic Surgery, Emergency Medicine Contact information: [place] [address] [phone]                  Signed: Feliberto Gottron 02/05/2021, 12:16 PM

## 2021-02-05 NOTE — TOC Initial Note (Signed)
Transition of Care [facility]) - Initial/Assessment Note    Patient Details  Name: Judy Prince MRN: [id number] Date of Birth: [birth year]  [facility] CM/SW Contact:    Adelene Amas, [place] Phone Number: 02/05/2021, 11:55 AM  Clinical Narrative:                   Patient will be discharging with DME: 3 in 1, bedside commode and rolling walker.  DME request sent.       Patient Goals and CMS Choice        Expected Discharge Plan and Services                                                Prior Living Arrangements/Services                       Activities of Daily Living Home Assistive Devices/Equipment: Eyeglasses, Kasandra Knudsen (specify quad or straight) ADL Screening (condition at time of admission) Patient's cognitive ability adequate to safely complete daily activities?: Yes Is the patient deaf or have difficulty hearing?: No Does the patient have difficulty seeing, even when wearing glasses/contacts?: No Does the patient have difficulty concentrating, remembering, or making decisions?: No Patient able to express need for assistance with ADLs?: Yes Does the patient have difficulty dressing or bathing?: No Independently performs ADLs?: Yes (appropriate for developmental age) Does the patient have difficulty walking or climbing stairs?: Yes Weakness of Legs: Left Weakness of Arms/Hands: None  Permission Sought/Granted                  Emotional Assessment              Admission diagnosis:  S/P TKR (total knee replacement) using cement, left [Z96.652] Patient Active Problem List   Diagnosis Date Noted   S/P TKR (total knee replacement) using cement, left 02/04/2021   Combined fat and carbohydrate induced hyperlipemia 07/02/2015   Arteriosclerosis of coronary artery 12/20/2013   Hyperlipidemia LDL goal <70 12/20/2013   OP (osteoporosis) 12/20/2013   Avitaminosis D 12/20/2013   Impingement syndrome of shoulder 11/29/2013   PCP:   Rusty Aus, MD Pharmacy:   [id number] - [place], [place] - [address] [place] [address] Phone: [phone] Fax: [place] [address] [place], [place] [address] Phone: [phone] Fax: [phone]     Social Determinants of Health (SDOH) Interventions    Readmission Risk Interventions No flowsheet data found.

## 2021-02-05 NOTE — Anesthesia Postprocedure Evaluation (Signed)
Anesthesia Post Note  Patient: Judy Prince  Procedure(s) Performed: TOTAL KNEE ARTHROPLASTY (Left: Knee)  Patient location during evaluation: Nursing Unit Anesthesia Type: Spinal Level of consciousness: oriented and awake and alert Pain management: pain level controlled Vital Signs Assessment: post-procedure vital signs reviewed and stable Respiratory status: spontaneous breathing and respiratory function stable Cardiovascular status: blood pressure returned to baseline and stable Postop Assessment: no headache, no backache, no apparent nausea or vomiting and patient able to bend at knees Anesthetic complications: no   No notable events documented.   Last Vitals:  Vitals:   02/05/21 0000 02/05/21 0400  BP: (!) 159/69 (!) 138/58  Pulse: 80 85  Resp: 16 16  Temp: 36.9 C 36.7 C  SpO2: 98% 96%    Last Pain:  Vitals:   02/05/21 0400  TempSrc: Temporal  PainSc:                  Alison Stalling

## 2021-02-14 ENCOUNTER — Other Ambulatory Visit: Payer: Self-pay

## 2021-02-14 MED ORDER — LOSARTAN POTASSIUM 25 MG PO TABS: 25.0000 mg | ORAL_TABLET | Freq: Every day | ORAL | 1 refills | Status: DC

## 2021-03-06 ENCOUNTER — Other Ambulatory Visit: Payer: Self-pay | Admitting: *Deleted

## 2021-03-06 MED ORDER — LOSARTAN POTASSIUM 25 MG PO TABS: 25.0000 mg | ORAL_TABLET | Freq: Every day | ORAL | 0 refills | Status: DC

## 2021-04-23 ENCOUNTER — Telehealth: Payer: Medicare HMO | Admitting: Cardiology

## 2021-04-23 NOTE — Telephone Encounter (Signed)
Patient called and reported she had a traumatic experience with surgery and has not been using her cpap but she will try.

## 2021-04-29 ENCOUNTER — Other Ambulatory Visit: Payer: Self-pay

## 2021-04-29 ENCOUNTER — Encounter: Payer: Self-pay | Admitting: Cardiovascular Disease

## 2021-04-29 ENCOUNTER — Ambulatory Visit: Payer: Medicare HMO | Admitting: Cardiovascular Disease

## 2021-04-29 VITALS — BP 124/70 | HR 70 | Ht 62.0 in | Wt 171.4 lb

## 2021-04-29 DIAGNOSIS — E785 Hyperlipidemia, unspecified: Secondary | ICD-10-CM

## 2021-04-29 DIAGNOSIS — I10 Essential (primary) hypertension: Secondary | ICD-10-CM | POA: Diagnosis not present

## 2021-04-29 DIAGNOSIS — G4733 Obstructive sleep apnea (adult) (pediatric): Secondary | ICD-10-CM | POA: Diagnosis not present

## 2021-04-29 DIAGNOSIS — I2510 Atherosclerotic heart disease of native coronary artery without angina pectoris: Secondary | ICD-10-CM | POA: Diagnosis not present

## 2021-04-29 NOTE — Progress Notes (Signed)
Cardiology Office Note   Date:  04/29/2021   ID:  Judy Prince, DOB 31-Mar-1955, MRN [id number]  PCP:  Rusty Aus, MD  Cardiologist:   Kathlyn Sacramento, MD   Chief Complaint  Patient presents with   Other    3-4 month f/u no complaints today. Meds reviewed verbally with pt.      History of Present Illness: Judy Prince is a 65 y.o. female who is here today for follow-up visit regarding coronary artery disease.    She had anginal symptoms in 2007.  She underwent cardiac catheterization which showed 70% mid RCA stenosis with moderate tortuosity in the vessel.  Attempted PCI was complicated by guide catheter induced spiral dissection which required placement of 3 overlapped Cypher stents.   She has done well since then with no recurrent cardiac events and has been on dual antiplatelet therapy with aspirin and clopidogrel.  She has other chronic medical conditions that include essential hypertension and hyperlipidemia.  She does have strong family history of coronary artery disease. Lower extremity arterial Doppler in April of this year showed normal ABI and toe pressure.   She had atypical chest pain in April 2022.  Lexiscan Myoview showed no evidence of ischemia with normal ejection fraction.  Echocardiogram showed normal LV systolic function, grade 1 diastolic dysfunction and mild tricuspid regurgitation. She was diagnosed with moderate sleep apnea in June 2022 and was subsequently placed on CPAP but she did not tolerate it. She underwent left knee replacement in November.  She has not obtained her full functionality and continues to have left leg swelling. She denies chest pain or shortness of breath.   Past Medical History:  Diagnosis Date   Anxiety    Arthritis    Basal cell carcinoma (BCC) of forearm    Coronary artery disease    a.) LHC 01/21/2006: 100 % pRCA, 100% mRCA, 100% dRCA. PCI peformed; 81% mRCA - PCI complicated by spiral dissection --> 3.0x 54mm Cypher DES to  dRCA, 3.0x73mm Cypher DES to mRCA, and 3.5x23 mm Cypher DES pRCA. b.) 07/2020 MV: EF>65%, no ischemia/infarct.   Cowden disease    Diastolic dysfunction    a.) 07/2020 Echo: EF 60-65%, no rwma, G1DD, nl RV fxn. Mildly dil LA. Triv MR.   Fibromyalgia    Gastritis    GERD (gastroesophageal reflux disease)    Hemorrhoids    History of kidney stones    Hyperlipidemia LDL goal <70    Hypertension    IBS (irritable bowel syndrome)    Lichen planus    Long term (current) use of antithrombotics/antiplatelets    a.) DAPT (ASA + clopidogrel)   Morbid obesity (HCC)    OSA (obstructive sleep apnea)    a.) "waiting on CPAP machine" as of 01/22/2021   Osteoporosis    Panic disorder     Past Surgical History:  Procedure Laterality Date   ABDOMINAL HYSTERECTOMY     BREAST BIOPSY Right 03/15/2017   PSEUDO-ANGIOMATOUS STROMAL HYPERPLASIA.    CHOLECYSTECTOMY     COLONOSCOPY     CORONARY ANGIOPLASTY WITH STENT PLACEMENT Left 01/21/2006   Procedure: CORONARY ANGIOPLASTY WITH STENT PLACEMENT (Cypher DES x 3 placed; 3.0 x 23 dRCA, 3.0 x 28 mRCA, 3.5 x 23 mm pRCA); Location: [facility]; Surgeon: Isaias Cowman, MD   ESOPHAGOGASTRODUODENOSCOPY     FINGER ARTHRODESIS Left 03/26/2016   Procedure: ARTHRODESIS FINGER;  Surgeon: Hessie Knows, MD;  Location: [facility];  Service: Orthopedics;  Laterality: Left;  SKIN CANCER EXCISION     TOTAL KNEE ARTHROPLASTY Left 02/04/2021   Procedure: TOTAL KNEE ARTHROPLASTY;  Surgeon: Hessie Knows, MD;  Location: [facility];  Service: Orthopedics;  Laterality: Left;   TUBAL LIGATION       Current Outpatient Medications  Medication Sig Dispense Refill   aspirin EC 81 MG tablet Take 81 mg by mouth daily. Swallow whole.     b complex vitamins capsule Take 1 capsule by mouth daily.     clopidogrel (PLAVIX) 75 MG tablet Take 75 mg by mouth daily.      ezetimibe (ZETIA) 10 MG tablet Take 1 tablet (10 mg total) by mouth daily. 90 tablet 3   famotidine (PEPCID) 20 MG tablet  Take 40 mg by mouth at bedtime.     Fexofenadine HCl (ALLEGRA PO) Take by mouth daily at 2 am.     fluticasone (FLONASE) 50 MCG/ACT nasal spray Place 2 sprays into both nostrils daily.     losartan (COZAAR) 25 MG tablet Take 1 tablet (25 mg total) by mouth daily. 90 tablet 0   Menthol, Topical Analgesic, (BIOFREEZE EX) Apply 1 application topically daily as needed (joint pain).     methocarbamol (ROBAXIN) 500 MG tablet Take 1 tablet (500 mg total) by mouth every 6 (six) hours as needed for muscle spasms. 30 tablet 0   metoprolol (LOPRESSOR) 50 MG tablet Take 50 mg by mouth 2 (two) times daily.      pantoprazole (PROTONIX) 40 MG tablet Take 40 mg by mouth daily.     polyethylene glycol (MIRALAX / GLYCOLAX) 17 g packet Take 17 g by mouth daily as needed for mild constipation. 14 each 0   rosuvastatin (CRESTOR) 20 MG tablet Take 20 mg by mouth every evening.      sertraline (ZOLOFT) 50 MG tablet Take 50 mg by mouth daily before lunch.   12   traMADol (ULTRAM) 50 MG tablet Take 1 tablet (50 mg total) by mouth every 6 (six) hours. 30 tablet 0   HYDROcodone-acetaminophen (NORCO/VICODIN) 5-325 MG tablet Take 1-2 tablets by mouth every 4 (four) hours as needed for moderate pain (pain score 4-6). (Patient not taking: Reported on 04/29/2021) 30 tablet 0   No current facility-administered medications for this visit.    Allergies:   Molds & smuts and Other    Social History:  The patient  reports that she has never smoked. She has never used smokeless tobacco. She reports that she does not drink alcohol and does not use drugs.   Family History:  The patient's family history includes Breast cancer in her maternal aunt; Diabetes in her brother, mother, and sister; Heart failure in her mother.    ROS:  Please see the history of present illness.   Otherwise, review of systems are positive for none.   All other systems are reviewed and negative.    PHYSICAL EXAM: VS:  BP 124/70 (BP Location: Left Arm,  Patient Position: Sitting, Cuff Size: Normal)    Pulse 70    Ht 5\' 2"  (1.575 m)    Wt 171 lb 6 oz (77.7 kg)    LMP  (LMP Unknown)    SpO2 98%    BMI 31.34 kg/m  , BMI Body mass index is 31.34 kg/m. GEN: Well nourished, well developed, in no acute distress  HEENT: normal  Neck: no JVD, carotid bruits, or masses Cardiac: RRR; no murmurs, rubs, or gallops,no edema  Respiratory:  clear to auscultation bilaterally, normal work of breathing  GI: soft, nontender, nondistended, + BS MS: no deformity or atrophy  Skin: warm and dry, no rash Neuro:  Strength and sensation are intact Psych: euthymic mood, full affect   EKG:  EKG is not ordered today.    Recent Labs: 01/22/2021: ALT 17 02/05/2021: BUN 11; Creatinine, Ser 0.62; Hemoglobin 10.6; Platelets 179; Potassium 4.6; Sodium 136    Lipid Panel No results found for: CHOL, TRIG, HDL, CHOLHDL, VLDL, LDLCALC, LDLDIRECT    Wt Readings from Last 3 Encounters:  04/29/21 171 lb 6 oz (77.7 kg)  02/04/21 174 lb (78.9 kg)  01/23/21 169 lb 8 oz (76.9 kg)        PAD Screen 06/01/2019  Previous PAD dx? Yes  Previous surgical procedure? No  Pain with walking? Yes  Subsides with rest? No  Feet/toe relief with dangling? No  Painful, non-healing ulcers? No  Extremities discolored? No      ASSESSMENT AND PLAN:  1.  Coronary artery disease involving native coronary arteries without angina: She is doing well overall with no cardiac events since 2007.  Given placement of 3 overlapping Cypher stents, I recommend lifelong dual antiplatelet therapy.  She is already on aspirin and clopidogrel.    2.  Mixed hyperlipidemia: She could not afford Vascepa.  I reviewed her recent lipid profile done in November which showed a total cholesterol of 176 with triglyceride of 472.  Continue rosuvastatin and ezetimibe.  I discussed with her the importance of increasing her physical activities and the importance of heart healthy diet.   3. Essential hypertension:  Blood pressure is well controlled.  4.  Sleep apnea: Unfortunately, she has not been tolerating CPAP very well.  I advised her to keep her office visit with Dr. Radford Pax next week.    Disposition:   FU with me in 12 months  Signed,  Kathlyn Sacramento, MD  04/29/2021 3:44 PM    [place]

## 2021-04-29 NOTE — Patient Instructions (Signed)
Medication Instructions:  °Your physician recommends that you continue on your current medications as directed. Please refer to the Current Medication list given to you today. ° °*If you need a refill on your cardiac medications before your next appointment, please call your pharmacy* ° ° °Lab Work: °None ordered °If you have labs (blood work) drawn today and your tests are completely normal, you will receive your results only by: °MyChart Message (if you have MyChart) OR °A paper copy in the mail °If you have any lab test that is abnormal or we need to change your treatment, we will call you to review the results. ° ° °Testing/Procedures: °None ordered ° ° °Follow-Up: °At [facility], you and your health needs are our priority.  As part of our continuing mission to provide you with exceptional heart care, we have created designated Provider Care Teams.  These Care Teams include your primary Cardiologist (physician) and Advanced Practice Providers (APPs -  Physician Assistants and Nurse Practitioners) who all work together to provide you with the care you need, when you need it. ° °We recommend signing up for the patient portal called "MyChart".  Sign up information is provided on this After Visit Summary.  MyChart is used to connect with patients for Virtual Visits (Telemedicine).  Patients are able to view lab/test results, encounter notes, upcoming appointments, etc.  Non-urgent messages can be sent to your provider as well.   °To learn more about what you can do with MyChart, go to [email or web address].   ° °Your next appointment:   °Your physician wants you to follow-up in: 1 year You will receive a reminder letter in the mail two months in advance. If you don't receive a letter, please call our office to schedule the follow-up appointment. ° ° °The format for your next appointment:   °In Person ° °Provider:   °You may see Judy Arida, MD or one of the following Advanced Practice Providers on your  designated Care Team:   °[name], NP °[name], PA-C °[name], PA-C{ ° ° ° °Other Instructions °N/A ° °

## 2021-05-04 NOTE — Progress Notes (Deleted)
Cardiology Office Note:    Date:  05/04/2021   ID:  Judy Prince, DOB November 19, 1955, MRN [id number]  PCP:  Rusty Aus, MD  Cardiologist:  Kathlyn Sacramento, MD    Referring MD: Rusty Aus, MD   No chief complaint on file.   History of Present Illness:    Judy Prince is a 66 y.o. female with a hx of anxiety/CAD/diastolic dysfunction/GERD/hypertension/hypertension and morbid obesity who was referred for sleep study.  She underwent [facility] home sleep study testing showing moderate obstructive sleep apnea with an AHI of 17.0/h and no significant central apnea with pAHIc 2.8/h.  O2 saturation nadir was 88%.  She was started on CPAP therapy and is now here for follow-up.  Past Medical History:  Diagnosis Date   Anxiety    Arthritis    Basal cell carcinoma (BCC) of forearm    Coronary artery disease    a.) LHC 01/21/2006: 100 % pRCA, 100% mRCA, 100% dRCA. PCI peformed; 01% mRCA - PCI complicated by spiral dissection --> 3.0x 47mm Cypher DES to dRCA, 3.0x65mm Cypher DES to mRCA, and 3.5x23 mm Cypher DES pRCA. b.) 07/2020 MV: EF>65%, no ischemia/infarct.   Cowden disease    Diastolic dysfunction    a.) 07/2020 Echo: EF 60-65%, no rwma, G1DD, nl RV fxn. Mildly dil LA. Triv MR.   Fibromyalgia    Gastritis    GERD (gastroesophageal reflux disease)    Hemorrhoids    History of kidney stones    Hyperlipidemia LDL goal <70    Hypertension    IBS (irritable bowel syndrome)    Lichen planus    Long term (current) use of antithrombotics/antiplatelets    a.) DAPT (ASA + clopidogrel)   Morbid obesity (HCC)    OSA (obstructive sleep apnea)    a.) "waiting on CPAP machine" as of 01/22/2021   Osteoporosis    Panic disorder     Past Surgical History:  Procedure Laterality Date   ABDOMINAL HYSTERECTOMY     BREAST BIOPSY Right 03/15/2017   PSEUDO-ANGIOMATOUS STROMAL HYPERPLASIA.    CHOLECYSTECTOMY     COLONOSCOPY     CORONARY ANGIOPLASTY WITH STENT PLACEMENT Left 01/21/2006   Procedure:  CORONARY ANGIOPLASTY WITH STENT PLACEMENT (Cypher DES x 3 placed; 3.0 x 23 dRCA, 3.0 x 28 mRCA, 3.5 x 23 mm pRCA); Location: [place]; Surgeon: Isaias Cowman, MD   ESOPHAGOGASTRODUODENOSCOPY     FINGER ARTHRODESIS Left 03/26/2016   Procedure: ARTHRODESIS FINGER;  Surgeon: Hessie Knows, MD;  Location: [facility];  Service: Orthopedics;  Laterality: Left;   SKIN CANCER EXCISION     TOTAL KNEE ARTHROPLASTY Left 02/04/2021   Procedure: TOTAL KNEE ARTHROPLASTY;  Surgeon: Hessie Knows, MD;  Location: [facility];  Service: Orthopedics;  Laterality: Left;   TUBAL LIGATION      Current Medications: No outpatient medications have been marked as taking for the 05/05/21 encounter (Appointment) with Sueanne Margarita, MD.     Allergies:   Molds & smuts and Other   Social History   Socioeconomic History   Marital status: Married    Spouse name: Not on file   Number of children: Not on file   Years of education: Not on file   Highest education level: Not on file  Occupational History   Not on file  Tobacco Use   Smoking status: Never   Smokeless tobacco: Never  Vaping Use   Vaping Use: Never used  Substance and Sexual Activity   Alcohol use: No  Alcohol/week: 0.0 standard drinks   Drug use: No   Sexual activity: Not on file  Other Topics Concern   Not on file  Social History Narrative   Not on file   Social Determinants of Health   Financial Resource Strain: Not on file  Food Insecurity: Not on file  Transportation Needs: Not on file  Physical Activity: Not on file  Stress: Not on file  Social Connections: Not on file     Family History: The patient's family history includes Breast cancer in her maternal aunt; Diabetes in her brother, mother, and sister; Heart failure in her mother.  ROS:   Please see the history of present illness.    ROS  All other systems reviewed and negative.   EKGs/Labs/Other Studies Reviewed:    The following studies were reviewed today: Home sleep  study  EKG:  EKG is not ordered today.    Recent Labs: 01/22/2021: ALT 17 02/05/2021: BUN 11; Creatinine, Ser 0.62; Hemoglobin 10.6; Platelets 179; Potassium 4.6; Sodium 136   Recent Lipid Panel No results found for: CHOL, TRIG, HDL, CHOLHDL, VLDL, LDLCALC, LDLDIRECT  CHA2DS2-VASc Score =   [ ] .  Therefore, the patient's annual risk of stroke is   %.        Physical Exam:    VS:  LMP  (LMP Unknown)     Wt Readings from Last 3 Encounters:  04/29/21 171 lb 6 oz (77.7 kg)  02/04/21 174 lb (78.9 kg)  01/23/21 169 lb 8 oz (76.9 kg)     GEN:  Well nourished, well developed in no acute distress HEENT: Normal NECK: No JVD; No carotid bruits LYMPHATICS: No lymphadenopathy CARDIAC: RRR, no murmurs, rubs, gallops RESPIRATORY:  Clear to auscultation without rales, wheezing or rhonchi  ABDOMEN: Soft, non-tender, non-distended MUSCULOSKELETAL:  No edema; No deformity  SKIN: Warm and dry NEUROLOGIC:  Alert and oriented x 3 PSYCHIATRIC:  Normal affect   ASSESSMENT:    1. OSA (obstructive sleep apnea)   2. Essential hypertension    PLAN:    In order of problems listed above:   OSA -[facility] home sleep study testing showing moderate obstructive sleep apnea with an AHI of 17.0/h and no significant central apnea with pAHIc 2.8/h.  O2 saturation nadir was 88%.  -She was started on CPAP therapy but has been intolerant.  2.  Hypertension -BP is adequately controlled on exam today. -Continue prescription drug management with losartan 25 mg daily and Lopressor 50 mg twice daily with.  Refills   Time Spent: 20 minutes total time of encounter, including 15 minutes spent in face-to-face patient care on the date of this encounter. This time includes coordination of care and counseling regarding above mentioned problem list. Remainder of non-face-to-face time involved reviewing chart documents/testing relevant to the patient encounter and documentation in the medical record. I have  independently reviewed documentation from referring provider  Medication Adjustments/Labs and Tests Ordered: Current medicines are reviewed at length with the patient today.  Concerns regarding medicines are outlined above.  No orders of the defined types were placed in this encounter.  No orders of the defined types were placed in this encounter.   Signed, Fransico Him, MD  05/04/2021 7:12 PM    [facility]

## 2021-05-05 ENCOUNTER — Ambulatory Visit: Payer: Medicare HMO | Admitting: Cardiology

## 2021-05-05 DIAGNOSIS — I10 Essential (primary) hypertension: Secondary | ICD-10-CM

## 2021-05-05 DIAGNOSIS — G4733 Obstructive sleep apnea (adult) (pediatric): Secondary | ICD-10-CM

## 2021-05-27 ENCOUNTER — Telehealth: Payer: Self-pay | Admitting: Cardiovascular Disease

## 2021-05-27 DIAGNOSIS — G4733 Obstructive sleep apnea (adult) (pediatric): Secondary | ICD-10-CM

## 2021-05-27 NOTE — Telephone Encounter (Signed)
Patient states that she would like a sleep doctor that is located in [place]. States she was referred to [facility]. Please call to discuss.

## 2021-05-27 NOTE — Telephone Encounter (Signed)
Yes, refer to pulmonary.

## 2021-05-27 NOTE — Telephone Encounter (Signed)
Msg fwd to Dr. Fletcher Anon to advise.

## 2021-05-28 ENCOUNTER — Ambulatory Visit: Payer: Medicare HMO | Admitting: Dermatology

## 2021-05-28 NOTE — Telephone Encounter (Signed)
Referral placed to Pulmonology. ? ?Spoke with the patient. ?Adv the patient that a referral has been placed to [facility] to take over management of her OSA. ?Adv the patient that their office will contact her to schedule. ?Patient verbalized understanding and voiced appreciation for the assistance. ?

## 2021-06-12 ENCOUNTER — Other Ambulatory Visit: Payer: Self-pay | Admitting: Cardiovascular Disease

## 2021-07-07 ENCOUNTER — Other Ambulatory Visit: Payer: Self-pay | Admitting: *Deleted

## 2021-07-07 DIAGNOSIS — E785 Hyperlipidemia, unspecified: Secondary | ICD-10-CM

## 2021-07-07 MED ORDER — EZETIMIBE 10 MG PO TABS: 10.0000 mg | ORAL_TABLET | Freq: Every day | ORAL | 3 refills | Status: AC

## 2021-07-18 ENCOUNTER — Ambulatory Visit: Payer: Medicare HMO | Admitting: Adult Health

## 2021-07-18 ENCOUNTER — Encounter: Payer: Self-pay | Admitting: Adult Health

## 2021-07-18 DIAGNOSIS — G4733 Obstructive sleep apnea (adult) (pediatric): Secondary | ICD-10-CM

## 2021-07-18 DIAGNOSIS — E669 Obesity, unspecified: Secondary | ICD-10-CM

## 2021-07-18 NOTE — Progress Notes (Signed)
? ?'@Patient'$  ID: Judy Prince, female    DOB: 02/04/1956, 66 y.o.   MRN: [id number] ? ?Chief Complaint  ?Patient presents with  ? sleep consult  ? ? ?Referring provider: ?Wellington Hampshire, MD ? ?HPI: ?65 year old female presents for sleep consult July 18, 2021 for sleep apnea ? ?TEST/EVENTS :  ? ?07/18/2021 Sleep consult  ?Patient presents for a sleep consult today.  She was kindly referred by her cardiologist Dr. Fletcher Anon.  Patient has underlying coronary artery disease with previous stents x3.  Patient was diagnosed with sleep apnea with June 2022.  She was found to have moderate obstructive sleep apnea with a AHI at 28/hour.  She was recommended to begin on CPAP.  Patient says she has tried CPAP several times but just cannot tolerate it.  She says it does not comfortable and she is not able to wear it.  She says she does have daytime sleepiness and restless sleep.  Epworth score is 2.  Mainly gets sleepy if she is an active watching TV.  Typically goes to bed about 10 to 11 PM.  Takes about 30 minutes to an hour to go to sleep.  And is typically up once each night.  And gets up about 5:45 AM.  She does not operate heavy machinery. Says her sleep is aggravated by joint pain  ?Minimal caffeine . No symptoms suspicious for cataplexy or sleep paralysis .  ? ?Patient works full-time as a Quarry manager.  She is married and lives with her husband.  She does have adult children.  Never smoker  She does not use alcohol or drugs. Wants to retire soon.  ? ?Family history positive for heart disease, cancer , diabetes, heart failure. ? ?Medical history is significant for coronary artery disease, diastolic dysfunction, fibromyalgia, GERD, kidney stones, hyperlipidemia, hypertension, IBS, lichen planus, osteoporosis, panic disorder. ? ?Surgical history status post left knee arthroplasty and November 2022, coronary stent placement, cholecystectomy, skin cancer excision. ? ?Allergies  ?Allergen Reactions  ? Molds & Smuts   ? Other    ? ? ?Immunization History  ?Administered Date(s) Administered  ? Influenza Inj Mdck Quad Pf 02/01/2019  ? Influenza,inj,Quad PF,6+ Mos 01/31/2020  ? Influenza-Unspecified 01/02/2016  ? PFIZER Comirnaty(Gray Top)Covid-19 Tri-Sucrose Vaccine 06/16/2019, 07/07/2019  ? Pneumococcal Polysaccharide-23 11/13/2018  ? Td 10/26/2014  ? ? ?Past Medical History:  ?Diagnosis Date  ? Anxiety   ? Arthritis   ? Basal cell carcinoma (BCC) of forearm   ? Coronary artery disease   ? a.) LHC 01/21/2006: 100 % pRCA, 100% mRCA, 100% dRCA. PCI peformed; 40% mRCA - PCI complicated by spiral dissection --> 3.0x 34m Cypher DES to dRCA, 3.0x270mCypher DES to mRCA, and 3.5x23 mm Cypher DES pRCA. b.) 07/2020 MV: EF>65%, no ischemia/infarct.  ? [place]  ? Diastolic dysfunction   ? a.) 07/2020 Echo: EF 60-65%, no rwma, G1DD, nl RV fxn. Mildly dil LA. Triv MR.  ? Fibromyalgia   ? Gastritis   ? GERD (gastroesophageal reflux disease)   ? Hemorrhoids   ? History of kidney stones   ? Hyperlipidemia LDL goal <70   ? Hypertension   ? IBS (irritable bowel syndrome)   ? Lichen planus   ? Long term (current) use of antithrombotics/antiplatelets   ? a.) DAPT (ASA + clopidogrel)  ? [place]bid [place]  ? OSA (obstructive sleep apnea)   ? a.) "waiting on CPAP machine" as of 01/22/2021  ? Osteoporosis   ? Panic disorder   ? ? ?  Tobacco History: ?Social History  ? ?Tobacco Use  ?Smoking Status Never  ?Smokeless Tobacco Never  ? ?Counseling given: Not Answered ? ? ?Outpatient Medications Prior to Visit  ?Medication Sig Dispense Refill  ? aspirin EC 81 MG tablet Take 81 mg by mouth daily. Swallow whole.    ? b complex vitamins capsule Take 1 capsule by mouth daily.    ? clopidogrel (PLAVIX) 75 MG tablet Take 75 mg by mouth daily.     ? ezetimibe (ZETIA) 10 MG tablet Take 1 tablet (10 mg total) by mouth daily. 90 tablet 3  ? famotidine (PEPCID) 20 MG tablet Take 40 mg by mouth at bedtime.    ? fluticasone (FLONASE) 50 MCG/ACT nasal spray Place 2  sprays into both nostrils daily.    ? HYDROcodone-acetaminophen (NORCO/VICODIN) 5-325 MG tablet Take 1-2 tablets by mouth every 4 (four) hours as needed for moderate pain (pain score 4-6). 30 tablet 0  ? losartan (COZAAR) 25 MG tablet TAKE 1 TABLET (25 MG TOTAL) BY MOUTH DAILY. 90 tablet 3  ? Menthol, Topical Analgesic, (BIOFREEZE EX) Apply 1 application topically daily as needed (joint pain).    ? methocarbamol (ROBAXIN) 500 MG tablet Take 1 tablet (500 mg total) by mouth every 6 (six) hours as needed for muscle spasms. 30 tablet 0  ? metoprolol (LOPRESSOR) 50 MG tablet Take 50 mg by mouth 2 (two) times daily.     ? pantoprazole (PROTONIX) 40 MG tablet Take 40 mg by mouth daily.    ? rosuvastatin (CRESTOR) 20 MG tablet Take 20 mg by mouth every evening.     ? sertraline (ZOLOFT) 50 MG tablet Take 50 mg by mouth daily before lunch.   12  ? traMADol (ULTRAM) 50 MG tablet Take 1 tablet (50 mg total) by mouth every 6 (six) hours. (Patient not taking: Reported on 07/18/2021) 30 tablet 0  ? Fexofenadine HCl (ALLEGRA PO) Take by mouth daily at 2 am.    ? polyethylene glycol (MIRALAX / GLYCOLAX) 17 g packet Take 17 g by mouth daily as needed for mild constipation. 14 each 0  ? ?No facility-administered medications prior to visit.  ? ? ? ?Review of Systems:  ? ?Constitutional:   No  weight loss, night sweats,  Fevers, chills, fatigue, or  lassitude. ? ?HEENT:   No headaches,  Difficulty swallowing,  Tooth/dental problems, or  Sore throat,  ?              No sneezing, itching, ear ache, nasal congestion, post nasal drip,  ? ?CV:  No chest pain,  Orthopnea, PND, swelling in lower extremities, anasarca, dizziness, palpitations, syncope.  ? ?GI  No heartburn, indigestion, abdominal pain, nausea, vomiting, diarrhea, change in bowel habits, loss of appetite, bloody stools.  ? ?Resp: No shortness of breath with exertion or at rest.  No excess mucus, no productive cough,  No non-productive cough,  No coughing up of blood.  No change  in color of mucus.  No wheezing.  No chest wall deformity ? ?Skin: no rash or lesions. ? ?GU: no dysuria, change in color of urine, no urgency or frequency.  No flank pain, no hematuria  ? ?MS:  No joint pain or swelling.  No decreased range of motion.  No back pain. ? ? ? ?Physical Exam ? ?BP 126/72 (BP Location: Left Arm, Cuff Size: Normal)   Pulse 78   Temp 97.8 ?F (36.6 ?C) (Temporal)   Ht '5\' 2"'$  (1.575 m)   Wt  171 lb (77.6 kg)   LMP  (LMP Unknown)   SpO2 96%   BMI 31.28 kg/m?  ? ?GEN: A/Ox3; pleasant , NAD, well nourished  ?  ?HEENT:  [place]/AT,  EACs-clear, TMs-wnl, NOSE-clear, THROAT-clear, no lesions, no postnasal drip or exudate noted.  Class 2-3 MP airway  ? ?NECK:  Supple w/ fair ROM; no JVD; normal carotid impulses w/o bruits; no thyromegaly or nodules palpated; no lymphadenopathy.   ? ?RESP  Clear  P & A; w/o, wheezes/ rales/ or rhonchi. no accessory muscle use, no dullness to percussion ? ?CARD:  RRR, no m/r/g, no peripheral edema, pulses intact, no cyanosis or clubbing. ? ?GI:   Soft & nt; nml bowel sounds; no organomegaly or masses detected.  ? ?Musco: Warm bil, no deformities or joint swelling noted.  ? ?Neuro: alert, no focal deficits noted.   ? ?Skin: Warm, no lesions or rashes ? ? ? ?Lab Results: ? ? ? ?BNP ?No results found for: BNP ? ?ProBNP ?No results found for: PROBNP ? ?Imaging: ?No results found. ? ? ? ?   ? View : No data to display.  ?  ?  ?  ? ? ?No results found for: NITRICOXIDE ? ? ? ? ? ?Assessment & Plan:  ? ?OSA (obstructive sleep apnea) ?Moderate obstructive sleep apnea with underlying comorbidities with heart disease and hyperlipidemia. ?We went over sleep apnea in detail.  Potential complications of untreated sleep apnea.  Patient education regarding CPAP usage.  Patient says she will retry her CPAP.  She does have a new DreamWear nasal mask.  She wants to try this and see if this works better. ?Decrease CPAP pressure to AutoSet 5-15 ?We went over treatment options including  weight loss, oral appliance and CPAP.  We also discussed the inspire device.  Patient says she is not interested in a surgical procedure at this time.   ? ?Plan  ?Patient Instructions  ?Restart CPAP At bedtime  , w

## 2021-07-18 NOTE — Progress Notes (Signed)
Reviewed and agree with assessment/plan. ? ? ?Chesley Mires, MD ?[place] ?07/18/2021, 4:29 PM ?Pager:  [phone] ? ?

## 2021-07-18 NOTE — Patient Instructions (Signed)
Restart CPAP At bedtime  , wear all night long for at least 6hr or more .  ?Work on healthy weight  ?Do not drive if sleepy .  ?Follow up in 3 months and As needed   ?

## 2021-07-18 NOTE — Assessment & Plan Note (Addendum)
Moderate obstructive sleep apnea with underlying comorbidities with heart disease and hyperlipidemia. ?We went over sleep apnea in detail.  Potential complications of untreated sleep apnea.  Patient education regarding CPAP usage.  Patient says she will retry her CPAP.  She does have a new DreamWear nasal mask.  She wants to try this and see if this works better. ?Decrease CPAP pressure to AutoSet 5-15 ?We went over treatment options including weight loss, oral appliance and CPAP.  We also discussed the inspire device.  Patient says she is not interested in a surgical procedure at this time.   ? ?Plan  ?Patient Instructions  ?Restart CPAP At bedtime  , wear all night long for at least 6hr or more .  ?Work on healthy weight  ?Do not drive if sleepy .  ?Follow up in 3 months and As needed   ?  ? ?

## 2021-07-18 NOTE — Assessment & Plan Note (Signed)
Healthy weight loss discussed 

## 2021-08-07 ENCOUNTER — Other Ambulatory Visit: Payer: Self-pay | Admitting: Internal Medicine

## 2021-08-07 DIAGNOSIS — Z1231 Encounter for screening mammogram for malignant neoplasm of breast: Secondary | ICD-10-CM

## 2021-08-23 IMAGING — US US BREAST*R* LIMITED INC AXILLA
1 series · 13 of 15 positions shown · non-contrast
Comparison: Previous exam(s).

CLINICAL DATA: Follow-up for probably benign right breast mass.
Patient has a history of ultrasound-guided biopsy of a mass in the
right breast at the 5 o'clock position with pathology demonstrating
pseudoangiomatous stromal hyperplasia. An additional similar
appearing smaller mass is present in the right breast at the [DATE]
position for which follow-up was recommended.

EXAM:
DIGITAL DIAGNOSTIC BILATERAL MAMMOGRAM WITH CAD AND TOMO
RIGHT BREAST ULTRASOUND

[Series 1: us breast*right* limited inc axilla · 0.05mm/px · 13 of 15 slices shown]
[im 1/15]
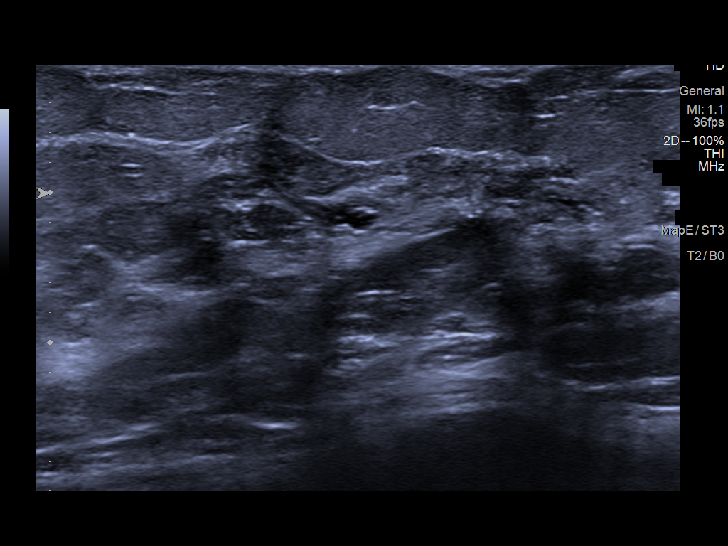
[im 2/15]
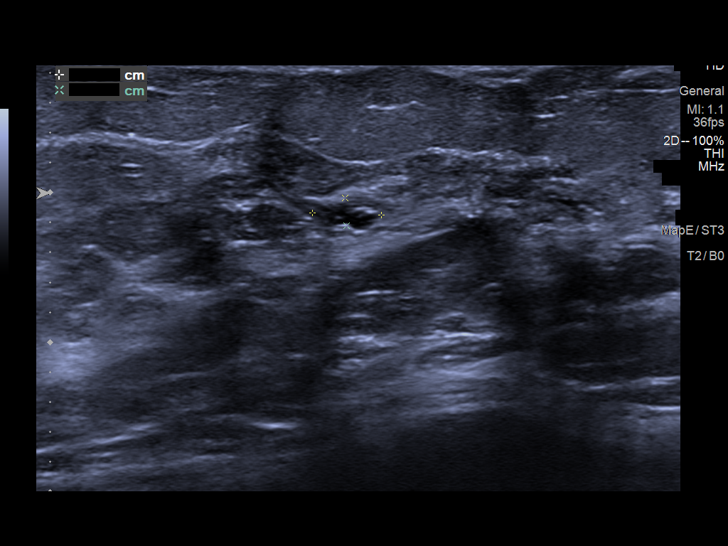
[im 3/15]
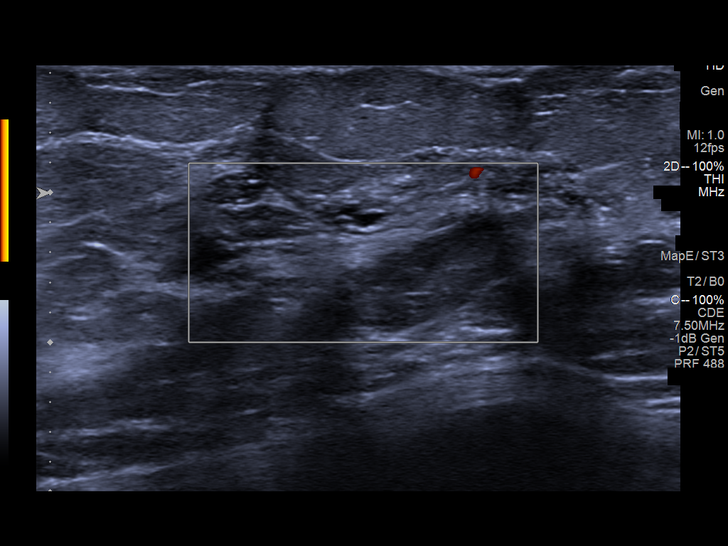
[im 5/15]
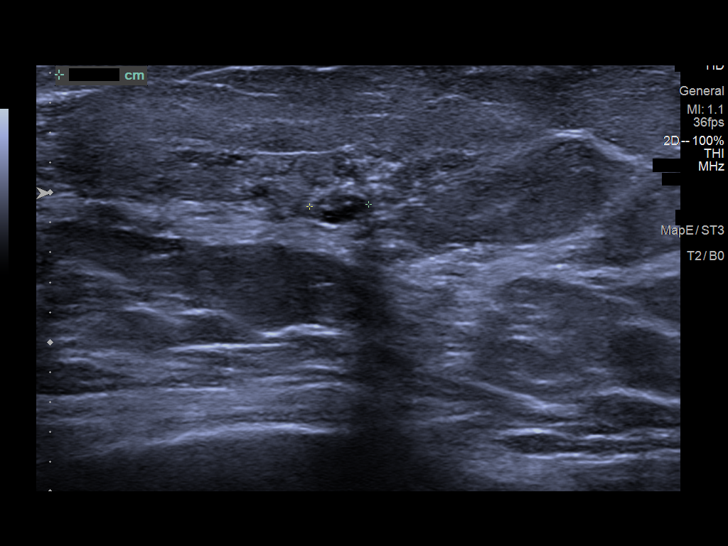
[im 6/15]
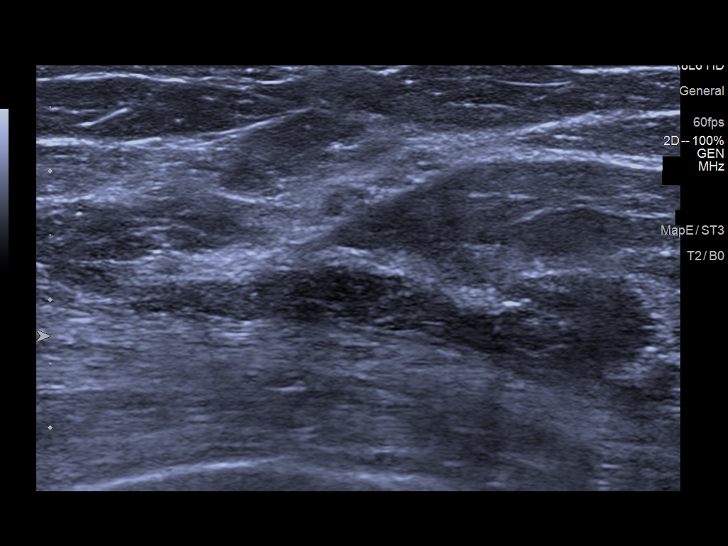
[im 7/15]
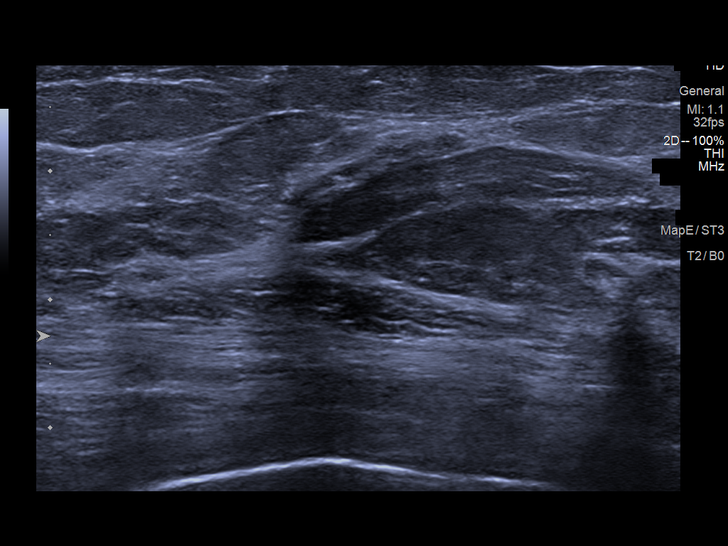
[im 8/15]
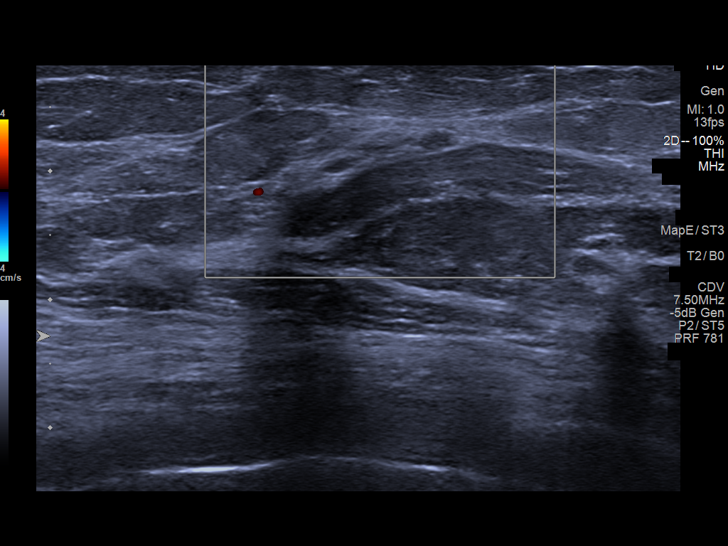
[im 9/15]
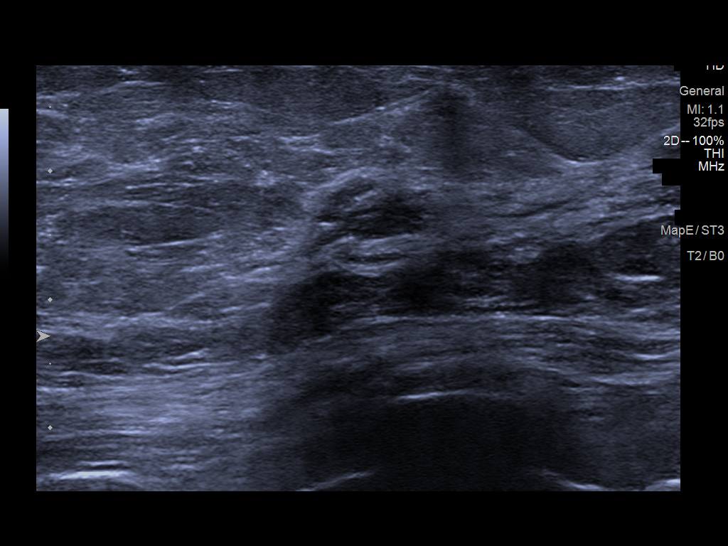
[im 10/15]
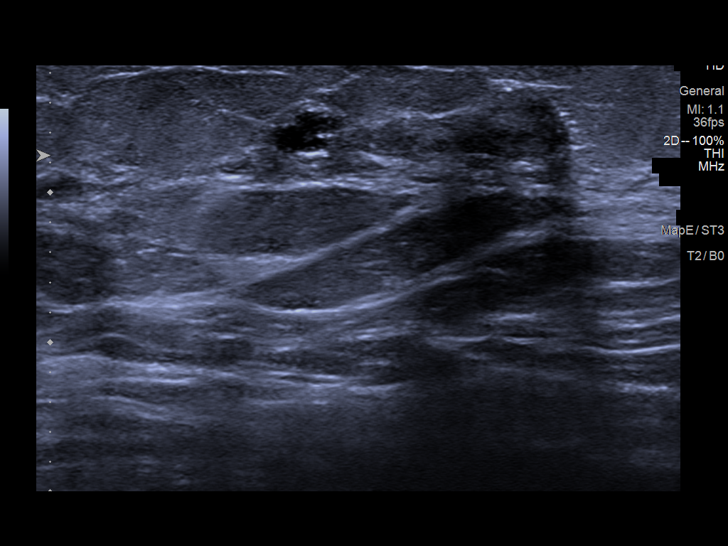
[im 11/15]
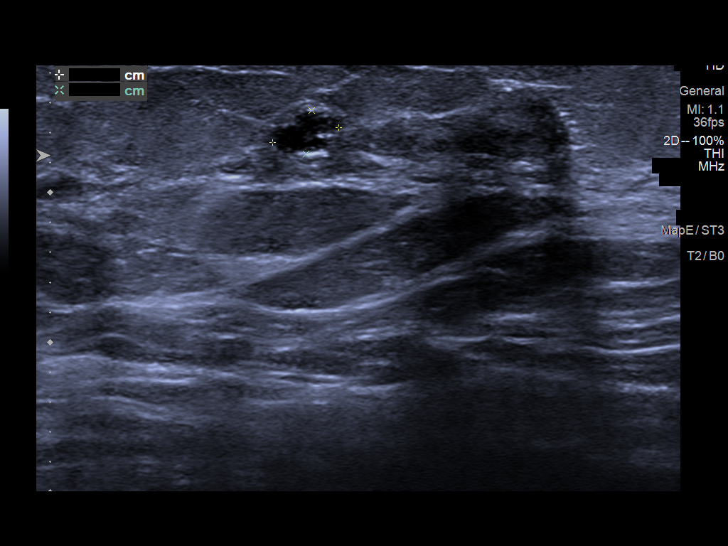
[im 13/15]
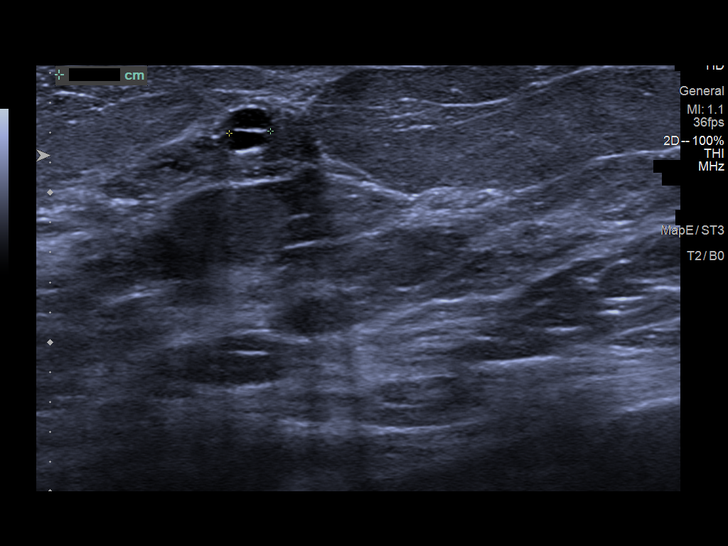
[im 14/15]
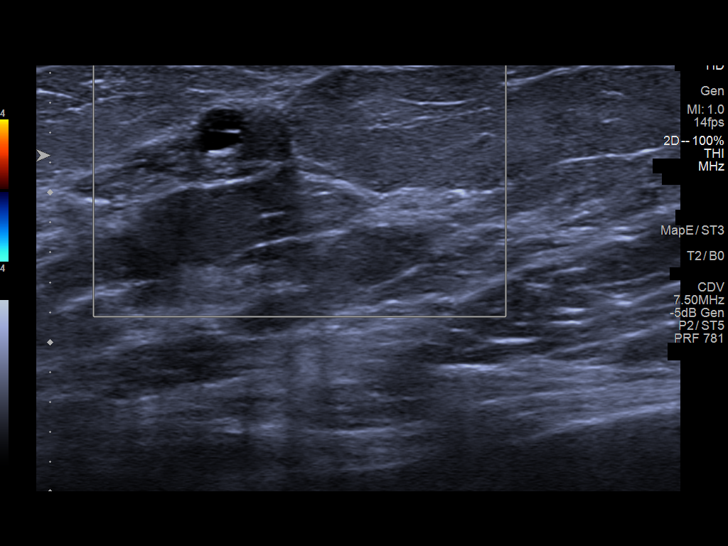
[im 15/15]
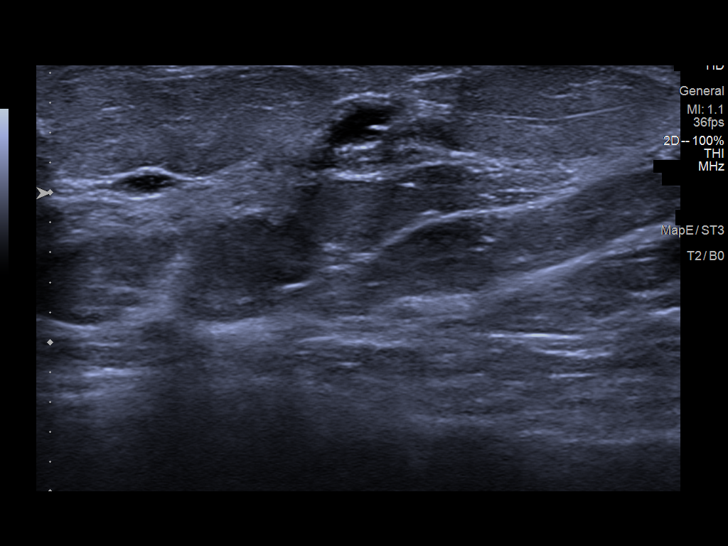

[13 of 15 positions shown; findings below may reference images not displayed]

ACR Breast Density Category b: There are scattered areas of
fibroglandular density.
FINDINGS: The asymmetry within the lower right breast containing a biopsy
marking clip appears unchanged. There is an asymmetry/possible mass
within the outer right breast which persists on the additional spot
compression tomograms however appears similar to more remote
mammograms, possibly representing an area of dense fibroglandular
tissue.

Mammographic images were processed with CAD.

Targeted ultrasound of the right breast was performed. The
hypoechoic mass at the [DATE] position 2 cm from the nipple measures
0.4 x 0.2 x 0.5 cm, slightly smaller in size previously measuring
0.6 x 0.4 x 0.5 cm. This is considered benign given 2 years of
stability.

Targeted ultrasound of the entire outer right breast was performed.
A cluster of cysts is seen at the [DATE] position 8 cm from nipple
measuring 0.5 x 0.3 x 0.3 cm with a smaller 0.4 cm cyst also seen at
the approximate [DATE] position. In addition, there is an area of
focally dense fibroglandular tissue identified at the [DATE] position.
The cysts and dense fibroglandular tissue account for the
asymmetry/possible mass seen in the outer right breast at
mammography. No suspicious masses or abnormality seen
sonographically in the outer right breast.
IMPRESSION: No findings of malignancy in either breast.

RECOMMENDATION:
Screening mammogram in one year.(Code:[id number]-E-NL1)

I have discussed the findings and recommendations with the patient.
If applicable, a reminder letter will be sent to the patient
regarding the next appointment.

BI-RADS CATEGORY  2: Benign.

## 2021-09-02 ENCOUNTER — Ambulatory Visit
Admission: RE | Admit: 2021-09-02 | Discharge: 2021-09-02 | Disposition: A | Discharge status: Home / Self Care | Payer: Medicare HMO | Source: Ambulatory Visit | Attending: Internal Medicine | Admitting: Internal Medicine

## 2021-09-02 DIAGNOSIS — Z1231 Encounter for screening mammogram for malignant neoplasm of breast: Secondary | ICD-10-CM | POA: Diagnosis present

## 2021-10-16 ENCOUNTER — Telehealth: Payer: Self-pay

## 2021-10-16 NOTE — Telephone Encounter (Signed)
ATC patient to request SD card. Unable to leave vm due to mailbox not being setup. Will call back.

## 2021-10-17 ENCOUNTER — Ambulatory Visit: Payer: Medicare HMO | Admitting: Adult Health

## 2021-10-17 NOTE — Telephone Encounter (Signed)
ATC x2--unable to leave vm due to mailbox not being setup. Will close encounter per office protocol.

## 2021-11-12 ENCOUNTER — Ambulatory Visit: Payer: Medicare HMO | Admitting: Dermatology

## 2021-11-12 ENCOUNTER — Encounter: Payer: Self-pay | Admitting: Dermatology

## 2021-11-12 DIAGNOSIS — Z1283 Encounter for screening for malignant neoplasm of skin: Secondary | ICD-10-CM

## 2021-11-12 DIAGNOSIS — Z85828 Personal history of other malignant neoplasm of skin: Secondary | ICD-10-CM | POA: Diagnosis not present

## 2021-11-12 DIAGNOSIS — D492 Neoplasm of unspecified behavior of bone, soft tissue, and skin: Secondary | ICD-10-CM | POA: Diagnosis not present

## 2021-11-12 DIAGNOSIS — L719 Rosacea, unspecified: Secondary | ICD-10-CM | POA: Diagnosis not present

## 2021-11-12 DIAGNOSIS — D229 Melanocytic nevi, unspecified: Secondary | ICD-10-CM

## 2021-11-12 DIAGNOSIS — L820 Inflamed seborrheic keratosis: Secondary | ICD-10-CM

## 2021-11-12 DIAGNOSIS — D1800 Hemangioma unspecified site: Secondary | ICD-10-CM

## 2021-11-12 DIAGNOSIS — L578 Other skin changes due to chronic exposure to nonionizing radiation: Secondary | ICD-10-CM

## 2021-11-12 DIAGNOSIS — L814 Other melanin hyperpigmentation: Secondary | ICD-10-CM

## 2021-11-12 DIAGNOSIS — L821 Other seborrheic keratosis: Secondary | ICD-10-CM

## 2021-11-12 MED ORDER — METRONIDAZOLE 0.75 % EX CREA: TOPICAL_CREAM | Freq: Two times a day (BID) | CUTANEOUS | 0 refills | Status: AC

## 2021-11-12 NOTE — Patient Instructions (Addendum)
Rosacea Start Metronidazole 0.75% cream twice daily to face  Cryotherapy Aftercare  Wash gently with soap and water everyday.   Apply Vaseline daily until healed.    Recommend daily broad spectrum sunscreen SPF 30+ to sun-exposed areas, reapply every 2 hours as needed. Call for new or changing lesions.  Staying in the shade or wearing long sleeves, sun glasses (UVA+UVB protection) and wide brim hats (4-inch brim around the entire circumference of the hat) are also recommended for sun protection.    Recommend taking Heliocare sun protection supplement daily in sunny weather for additional sun protection. For maximum protection on the sunniest days, you can take up to 2 capsules of regular Heliocare OR take 1 capsule of Heliocare Ultra. For prolonged exposure (such as a full day in the sun), you can repeat your dose of the supplement 4 hours after your first dose. Heliocare can be purchased at [employer], at some [facility] or at [email or web address].    Melanoma ABCDEs  Melanoma is the most dangerous type of skin cancer, and is the leading cause of death from skin disease.  You are more likely to develop melanoma if you: Have light-colored skin, light-colored eyes, or red or blond hair Spend a lot of time in the sun Tan regularly, either outdoors or in a tanning bed Have had blistering sunburns, especially during childhood Have a close family member who has had a melanoma Have atypical moles or large birthmarks  Early detection of melanoma is key since treatment is typically straightforward and cure rates are extremely high if we catch it early.   The first sign of melanoma is often a change in a mole or a new dark spot.  The ABCDE system is a way of remembering the signs of melanoma.  A for asymmetry:  The two halves do not match. B for border:  The edges of the growth are irregular. C for color:  A mixture of colors are present instead of an even brown color. D for diameter:   Melanomas are usually (but not always) greater than 61m - the size of a pencil eraser. E for evolution:  The spot keeps changing in size, shape, and color.  Please check your skin once per month between visits. You can use a small mirror in front and a large mirror behind you to keep an eye on the back side or your body.   If you see any new or changing lesions before your next follow-up, please call to schedule a visit.  Please continue daily skin protection including broad spectrum sunscreen SPF 30+ to sun-exposed areas, reapplying every 2 hours as needed when you're outdoors.   Staying in the shade or wearing long sleeves, sun glasses (UVA+UVB protection) and wide brim hats (4-inch brim around the entire circumference of the hat) are also recommended for sun protection.  Melanoma ABCDEs  Melanoma is the most dangerous type of skin cancer, and is the leading cause of death from skin disease.  You are more likely to develop melanoma if you: Have light-colored skin, light-colored eyes, or red or blond hair Spend a lot of time in the sun Tan regularly, either outdoors or in a tanning bed Have had blistering sunburns, especially during childhood Have a close family member who has had a melanoma Have atypical moles or large birthmarks  Early detection of melanoma is key since treatment is typically straightforward and cure rates are extremely high if we catch it early.   The first sign of  melanoma is often a change in a mole or a new dark spot.  The ABCDE system is a way of remembering the signs of melanoma.  A for asymmetry:  The two halves do not match. B for border:  The edges of the growth are irregular. C for color:  A mixture of colors are present instead of an even brown color. D for diameter:  Melanomas are usually (but not always) greater than 13m - the size of a pencil eraser. E for evolution:  The spot keeps changing in size, shape, and color.  Please check your skin once per  month between visits. You can use a small mirror in front and a large mirror behind you to keep an eye on the back side or your body.   If you see any new or changing lesions before your next follow-up, please call to schedule a visit.  Please continue daily skin protection including broad spectrum sunscreen SPF 30+ to sun-exposed areas, reapplying every 2 hours as needed when you're outdoors.   Staying in the shade or wearing long sleeves, sun glasses (UVA+UVB protection) and wide brim hats (4-inch brim around the entire circumference of the hat) are also recommended for sun protection.    Due to recent changes in healthcare laws, you may see results of your pathology and/or laboratory studies on MyChart before the doctors have had a chance to review them. We understand that in some cases there may be results that are confusing or concerning to you. Please understand that not all results are received at the same time and often the doctors may need to interpret multiple results in order to provide you with the best plan of care or course of treatment. Therefore, we ask that you please give u[place]2 business days to thoroughly review all your results before contacting the office for clarification. Should we see a critical lab result, you will be contacted sooner.   If You Need Anything After Your Visit  If you have any questions or concerns for your doctor, please call our main line at [phone] press option 4 to reach your doctor's medical assistant. If no one answers, please leave a voicemail as directed and we will return your call as soon as possible. Messages left after 4 pm will be answered the following business day.   You may also send [place] message via [place] We typically respond to MyChart messages within 1-2 business days.  For prescription refills, please ask your pharmacy to contact our office. Our fax number is [phone]  If you have an urgent issue when the clinic is closed  that cannot wait until the next business day, you can page your doctor at the number below.    Please note that while we do our best to be available for urgent issues outside of office hours, we are not available 24/7.   If you have an urgent issue and are unable to reach u[place] you may choose to seek medical care at your doctor's office, retail clinic, urgent care center, or emergency room.  If you have a medical emergency, please immediately call 911 or go to the emergency department.  Pager Numbers  - Dr. KNehemiah Massed [phone] - Dr. MLaurence Ferrari [phone] - Dr. SNicole Kindred [phone] In the event of inclement weather, please call our main line at [phone] an update on the status of any delays or closures.  Dermatology Medication Tips: Please keep the boxes that topical medications come in  in order to help keep track of the instructions about where and how to use these. Pharmacies typically print the medication instructions only on the boxes and not directly on the medication tubes.   If your medication is too expensive, please contact our office at [phone] option 4 or send [place] a message through [place].   We are unable to tell what your co-pay for medications will be in advance as this is different depending on your insurance coverage. However, we may be able to find a substitute medication at lower cost or fill out paperwork to get insurance to cover a needed medication.   If a prior authorization is required to get your medication covered by your insurance company, please allow [place] 1-2 business days to complete this process.  Drug prices often vary depending on where the prescription is filled and some pharmacies may offer cheaper prices.  The website www.goodrx.com contains coupons for medications through different pharmacies. The prices here do not account for what the cost may be with help from insurance (it may be cheaper with your insurance), but the website can give you  the price if you did not use any insurance.  - You can print the associated coupon and take it with your prescription to the pharmacy.  - You may also stop by our office during regular business hours and pick up a GoodRx coupon card.  - If you need your prescription sent electronically to a different pharmacy, notify our office through [facility] or by phone at [phone] option 4.     Si Usted Necesita Algo Despus de Su Visita  Tambin puede enviarnos un mensaje a travs de Pharmacist, community. Por lo general respondemos a los mensajes de MyChart en el transcurso de 1 a 2 das hbiles.  Para renovar recetas, por favor pida a su farmacia que se ponga en contacto con nuestra oficina. Harland Dingwall de fax es [place] [phone].  Si tiene un asunto urgente cuando la clnica est cerrada y que no puede esperar hasta el siguiente da hbil, puede llamar/localizar a su doctor(a) al nmero que aparece a continuacin.   Por favor, tenga en cuenta que aunque hacemos todo lo posible para estar disponibles para asuntos urgentes fuera del horario de [place], no estamos disponibles las 24 horas del da, los 7 das de la [place].   Si tiene un problema urgente y no puede comunicarse con nosotros, puede optar por buscar atencin mdica  en el consultorio de su doctor(a), en una clnica privada, en un centro de atencin urgente o en una sala de emergencias.  Si tiene Engineering geologist, por favor llame inmediatamente al 911 o vaya a la sala de emergencias.  Nmeros de bper  - Dr. Nehemiah Massed: [phone]  - Dra. Moye: [phone]  - Dra. Nicole Kindred: [phone]  En caso de inclemencias del [place], por favor llame a Johnsie Kindred principal al [phone] para una actualizacin sobre el [place] de cualquier retraso o cierre.  Consejos para la medicacin en dermatologa: Por favor, guarde las cajas en las que vienen los medicamentos de uso tpico para ayudarle a seguir las instrucciones sobre dnde y  cmo usarlos. Las farmacias generalmente imprimen las instrucciones del medicamento slo en las cajas y no directamente en los tubos del [place].   Si su medicamento es muy caro, por favor, pngase en contacto con Zigmund Daniel llamando al [phone] y presione la opcin 4 o envenos un mensaje a travs de Pharmacist, community.   No podemos decirle cul ser  su copago por los medicamentos por adelantado ya que esto es diferente dependiendo de la cobertura de su seguro. Sin embargo, es posible que podamos encontrar un medicamento sustituto a Electrical engineer un formulario para que el seguro cubra el medicamento que se considera necesario.   Si se requiere una autorizacin previa para que su compaa de seguros [place] su medicamento, por favor permtanos de 1 a 2 das hbiles para completar este proceso.  Los precios de los medicamentos varan con frecuencia dependiendo del Environmental consultant de dnde se surte la receta y alguna farmacias pueden ofrecer precios ms baratos.  El sitio web www.goodrx.com tiene cupones para medicamentos de Airline pilot. Los precios aqu no tienen en cuenta lo que podra costar con la ayuda del seguro (puede ser ms barato con su seguro), pero el sitio web puede darle el precio si no utiliz Research scientist (physical sciences).  - Puede imprimir el cupn correspondiente y llevarlo con su receta a la farmacia.  - Tambin puede pasar por nuestra oficina durante el horario de atencin regular y Charity fundraiser una tarjeta de cupones de GoodRx.  - Si necesita que su receta se enve electrnicamente a una farmacia diferente, informe a nuestra oficina a travs de MyChart de [place] o por telfono llamando al [phone] y presione la opcin 4.

## 2021-11-12 NOTE — Progress Notes (Signed)
Follow-Up Visit   Subjective  Judy Prince is a 66 y.o. female who presents for the following: Skin Problem (Check nodule under skin at left cheek. Dur: 6 months. Non tender unless traumatized) and Annual Exam (HxBCC on left forearm. Areas of concern).  The patient presents for Total-Body Skin Exam (TBSE) for skin cancer screening and mole check.  The patient has spots, moles and lesions to be evaluated, some may be new or changing and the patient has concerns that these could be cancer.   The following portions of the chart were reviewed this encounter and updated as appropriate:  Tobacco  Allergies  Meds  Problems  Med Hx  Surg Hx  Fam Hx      Review of Systems: No other skin or systemic complaints except as noted in HPI or Assessment and Plan.   Objective  Well appearing patient in no apparent distress; mood and affect are within normal limits.  A full examination was performed including scalp, head, eyes, ears, nose, lips, neck, chest, axillae, abdomen, back, buttocks, bilateral upper extremities, bilateral lower extremities, hands, feet, fingers, toes, fingernails, and toenails. All findings within normal limits unless otherwise noted below.  left cheek Deep, subcutaneous, slightly rubbery nodule   Right Lower Abdomen x1 Erythematous keratotic or waxy stuck-on papule or plaque.  Head - Anterior (Face) Mid face erythema with telangiectasias +/- scattered inflammatory papules.    Assessment & Plan   History of Basal Cell Carcinoma of the Skin. Arm - No evidence of recurrence today - Recommend regular full body skin exams - Recommend daily broad spectrum sunscreen SPF 30+ to sun-exposed areas, reapply every 2 hours as needed.  - Call if any new or changing lesions are noted between office visits   Lentigines - Scattered tan macules - Due to sun exposure - Benign-appearing, observe - Recommend daily broad spectrum sunscreen SPF 30+ to sun-exposed areas, reapply  every 2 hours as needed. - Call for any changes  Seborrheic Keratoses - Stuck-on, waxy, tan-brown papules and/or plaques  - Benign-appearing - Discussed benign etiology and prognosis. - Observe - Call for any changes  Melanocytic Nevi - Tan-brown and/or pink-flesh-colored symmetric macules and papules - Benign appearing on exam today - Observation - Call clinic for new or changing moles - Recommend daily use of broad spectrum spf 30+ sunscreen to sun-exposed areas.   Hemangiomas - Red papules - Discussed benign nature - Observe - Call for any changes  Actinic Damage - Chronic condition, secondary to cumulative UV/sun exposure - diffuse scaly erythematous macules with underlying dyspigmentation - Recommend daily broad spectrum sunscreen SPF 30+ to sun-exposed areas, reapply every 2 hours as needed.  - Staying in the shade or wearing long sleeves, sun glasses (UVA+UVB protection) and wide brim hats (4-inch brim around the entire circumference of the hat) are also recommended for sun protection.  - Call for new or changing lesions.  Skin cancer screening performed today.  Neoplasm of skin left cheek  Possible carotid gland growth. Recommend referral to ENT for evaluation.  Related Procedures Ambulatory referral to ENT  Inflamed seborrheic keratosis Right Lower Abdomen x1  Symptomatic, irritating, patient would like treated.  Destruction of lesion - Right Lower Abdomen x1  Destruction method: cryotherapy   Informed consent: discussed and consent obtained   Lesion destroyed using liquid nitrogen: Yes   Outcome: patient tolerated procedure well with no complications   Post-procedure details: wound care instructions given   Additional details:  Prior to procedure, discussed  risks of blister formation, small wound, skin dyspigmentation, or rare scar following cryotherapy. Recommend Vaseline ointment to treated areas while healing.   Rosacea Head - Anterior  (Face)  Chronic and persistent condition with duration or expected duration over one year. Condition is symptomatic/ bothersome to patient. Not currently at goal.  Rosacea is a chronic progressive skin condition usually affecting the face of adults, causing redness and/or acne bumps. It is treatable but not curable. It sometimes affects the eyes (ocular rosacea) as well. It may respond to topical and/or systemic medication and can flare with stress, sun exposure, alcohol, exercise and some foods.  Daily application of broad spectrum spf 30+ sunscreen to face is recommended to reduce flares.  Start Metronidazole 0.75% cream twice daily to face for rosacea.  Consider adding sodium sulfacetamide wash if needed at next visit.   metroNIDAZOLE (METROCREAM) 0.75 % cream - Head - Anterior (Face) Apply topically 2 (two) times daily.   No follow-ups on file.  I, Emelia Salisbury, CMA, am acting as scribe for Forest Gleason, MD.  Documentation: I have reviewed the above documentation for accuracy and completeness, and I agree with the above.  Forest Gleason, MD

## 2021-11-19 ENCOUNTER — Encounter: Payer: Self-pay | Admitting: Dermatology

## 2021-11-26 ENCOUNTER — Other Ambulatory Visit: Payer: Self-pay | Admitting: Student

## 2021-11-26 DIAGNOSIS — D37030 Neoplasm of uncertain behavior of the parotid salivary glands: Secondary | ICD-10-CM

## 2021-12-10 ENCOUNTER — Ambulatory Visit
Admission: RE | Admit: 2021-12-10 | Discharge: 2021-12-10 | Disposition: A | Discharge status: Home / Self Care | Payer: Medicare HMO | Source: Ambulatory Visit | Attending: Student | Admitting: Student

## 2021-12-10 DIAGNOSIS — D37030 Neoplasm of uncertain behavior of the parotid salivary glands: Secondary | ICD-10-CM

## 2021-12-11 ENCOUNTER — Telehealth: Payer: Self-pay | Admitting: Cardiovascular Disease

## 2021-12-11 NOTE — Telephone Encounter (Signed)
Pt c/o medication issue:  1. Name of Medication:   clopidogrel (PLAVIX) 75 MG tablet  rosuvastatin (CRESTOR) 20 MG tablet  2. How are you currently taking this medication (dosage and times per day)?   As prescribed  3. Are you having a reaction (difficulty breathing--STAT)?   No  4. What is your medication issue?   Patient stated she had been taking these two medications and she recently read that the side effects of taking these two medications together can cause muscle ache.  Patient stated she has had muscle pains and she wants to know if she can stop taking the Crestor.

## 2021-12-12 NOTE — Telephone Encounter (Signed)
Routed to Dr. Fletcher Anon on 12/11/21 to advise

## 2021-12-16 MED ORDER — ROSUVASTATIN CALCIUM 10 MG PO TABS: 20.0000 mg | ORAL_TABLET | Freq: Every evening | ORAL | Status: DC

## 2021-12-16 NOTE — Telephone Encounter (Signed)
Patient made aware of Dr. Tyrell Antonio response and recommendation. She will cut her 20 mg tab of rosuvastatin in half and take 10 mg daily. Adv the pt to contact the office if symptoms do not improve. Patient verbalized understanding.

## 2021-12-16 NOTE — Telephone Encounter (Signed)
No issues of taking Plavix and rosuvastatin together.  She needs rosuvastatin given her known coronary artery disease.  If she is having muscle aches on the current dose, we can decrease the dose to 10 mg daily.

## 2021-12-18 ENCOUNTER — Ambulatory Visit
Admission: RE | Admit: 2021-12-18 | Discharge: 2021-12-18 | Disposition: A | Discharge status: Home / Self Care | Payer: Medicare HMO | Source: Ambulatory Visit | Attending: Student | Admitting: Student

## 2021-12-18 MED ORDER — IOPAMIDOL (ISOVUE-300) INJECTION 61%
75.0000 mL | Freq: Once | INTRAVENOUS | Status: AC | PRN
  Administered 2021-12-18: 75 mL via INTRAVENOUS

## 2022-02-06 IMAGING — CR DG HIP (WITH OR WITHOUT PELVIS) 2-3V*L*
1 series · 3 of 3 positions shown · non-contrast
Comparison: None.

CLINICAL DATA: Fall.  Left hip pain

EXAM:
DG HIP (WITH OR WITHOUT PELVIS) 2-3V LEFT

[Series 1: dg hip unilat w or w/o pelvis 2-3 views  · non-contrast · 0.14mm/px · 3 of 3 slices shown]
[im 1/3]
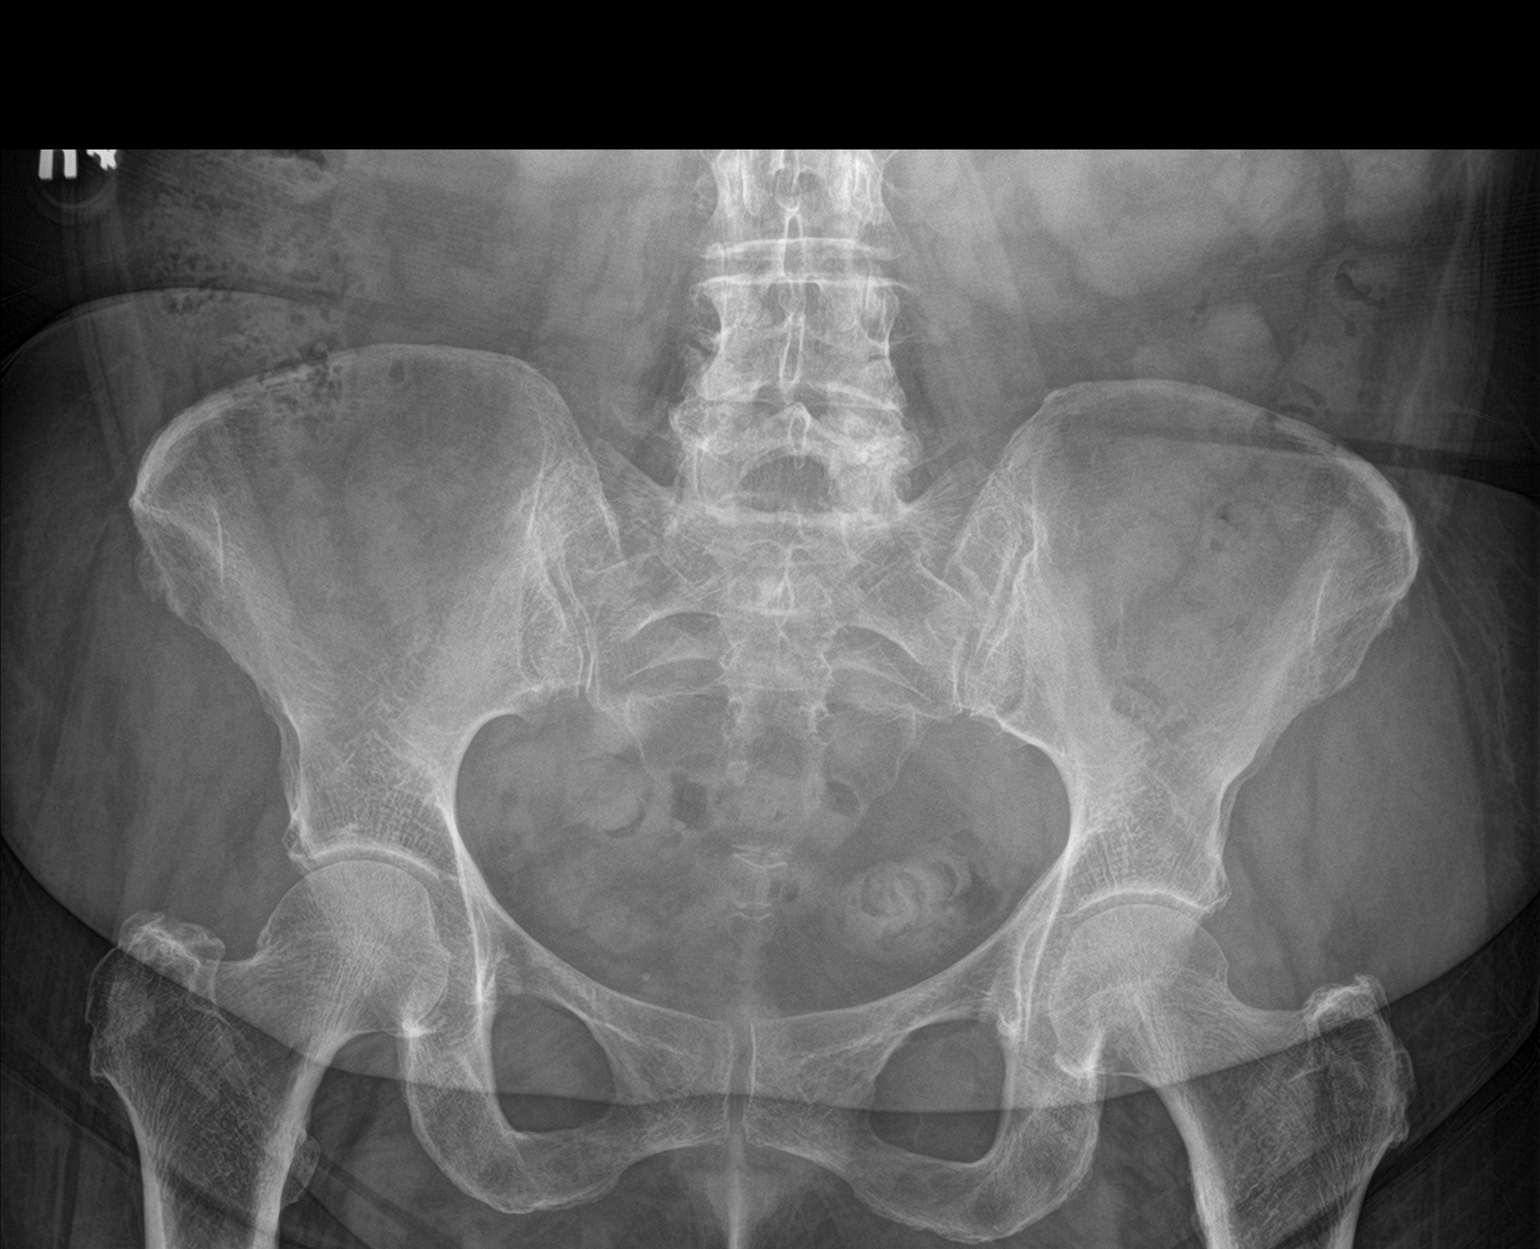
[im 2/3]
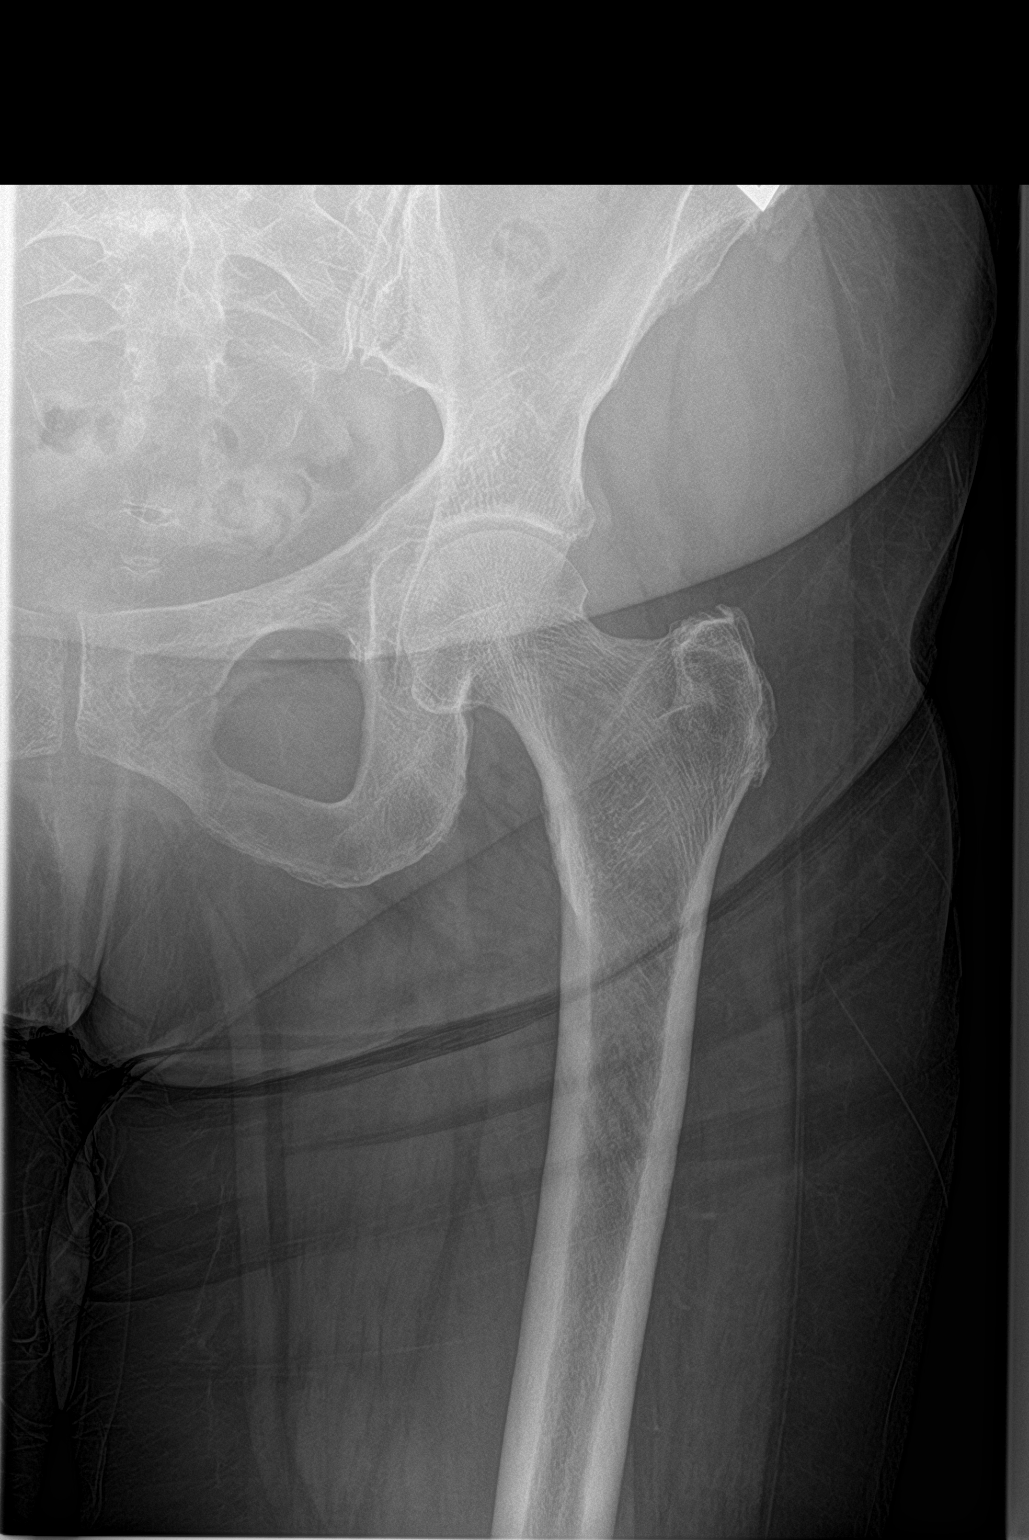
[im 3/3]
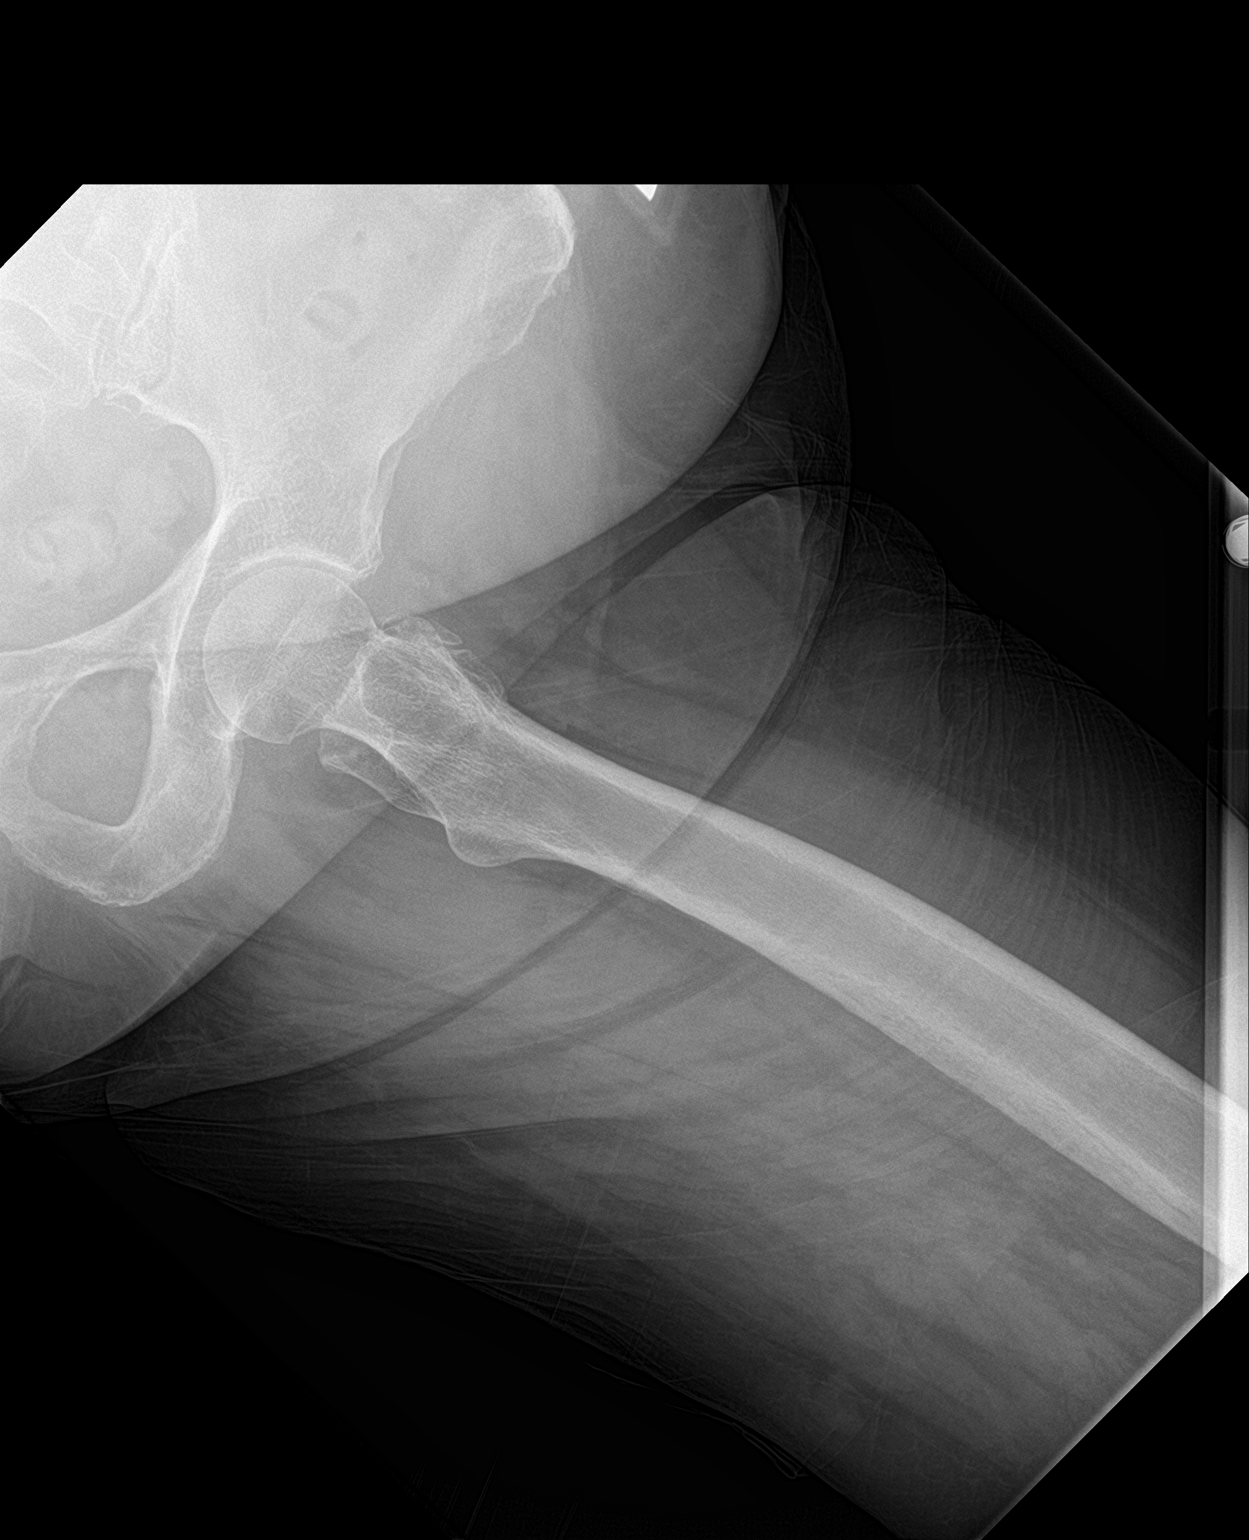

[3 of 3 positions shown; findings below may reference images not displayed]

FINDINGS: No acute bony abnormality. Specifically, no fracture, subluxation,
or dislocation. Hip joints and SI joints are symmetric.
IMPRESSION: No acute bony abnormality.

## 2022-04-28 ENCOUNTER — Encounter: Payer: Medicare HMO | Admitting: Dermatology

## 2022-05-01 ENCOUNTER — Ambulatory Visit: Payer: Medicare HMO | Attending: Cardiovascular Disease | Admitting: Cardiovascular Disease

## 2022-05-01 ENCOUNTER — Encounter: Payer: Self-pay | Admitting: Cardiovascular Disease

## 2022-05-01 VITALS — BP 120/80 | HR 66 | Ht 62.0 in | Wt 178.2 lb

## 2022-05-01 DIAGNOSIS — I2510 Atherosclerotic heart disease of native coronary artery without angina pectoris: Secondary | ICD-10-CM

## 2022-05-01 DIAGNOSIS — I10 Essential (primary) hypertension: Secondary | ICD-10-CM | POA: Diagnosis not present

## 2022-05-01 DIAGNOSIS — E785 Hyperlipidemia, unspecified: Secondary | ICD-10-CM

## 2022-05-01 DIAGNOSIS — G4733 Obstructive sleep apnea (adult) (pediatric): Secondary | ICD-10-CM | POA: Diagnosis not present

## 2022-05-01 NOTE — Patient Instructions (Signed)
Medication Instructions:  No changes *If you need a refill on your cardiac medications before your next appointment, please call your pharmacy*   Lab Work: None ordered If you have labs (blood work) drawn today and your tests are completely normal, you will receive your results only by: MyChart Message (if you have MyChart) OR A paper copy in the mail If you have any lab test that is abnormal or we need to change your treatment, we will call you to review the results.   Testing/Procedures: None ordered   Follow-Up: At [facility], you and your health needs are our priority.  As part of our continuing mission to provide you with exceptional heart care, we have created designated Provider Care Teams.  These Care Teams include your primary Cardiologist (physician) and Advanced Practice Providers (APPs -  Physician Assistants and Nurse Practitioners) who all work together to provide you with the care you need, when you need it.  We recommend signing up for the patient portal called "MyChart".  Sign up information is provided on this After Visit Summary.  MyChart is used to connect with patients for Virtual Visits (Telemedicine).  Patients are able to view lab/test results, encounter notes, upcoming appointments, etc.  Non-urgent messages can be sent to your provider as well.   To learn more about what you can do with MyChart, go to [email or web address].    Your next appointment:   12 month(s)  Provider:   You may see Muhammad Arida, MD or one of the following Advanced Practice Providers on your designated Care Team:   [name], NP [name], PA-C [name], PA-C Sheri Hammock, NP    

## 2022-05-01 NOTE — Progress Notes (Signed)
Cardiology Office Note   Date:  05/01/2022   ID:  Judy Prince, DOB [birth year], MRN [id number]  PCP:  Rusty Aus, MD  Cardiologist:   Kathlyn Sacramento, MD   Chief Complaint  Patient presents with   Other    12 month f/u no complaints today. Meds reviewed verbally with pt.      History of Present Illness: Judy Prince is a 67 y.o. female who is here today for follow-up visit regarding coronary artery disease.   She had anginal symptoms in 2007.  She underwent cardiac catheterization which showed 70% mid RCA stenosis with moderate tortuosity in the vessel.  Attempted PCI was complicated by guide catheter induced spiral dissection which required placement of 3 overlapped Cypher stents.  She has done well since then with no recurrent cardiac events and has been on dual antiplatelet therapy with aspirin and clopidogrel.  She has other chronic medical conditions that include essential hypertension and hyperlipidemia.  She does have strong family history of coronary artery disease. Lower extremity arterial Doppler in April of 2021 showed normal ABI and toe pressure.   She had atypical chest pain in April 2022.  Lexiscan Myoview showed no evidence of ischemia with normal ejection fraction.  Echocardiogram showed normal LV systolic function, grade 1 diastolic dysfunction and mild tricuspid regurgitation.  She was diagnosed with moderate sleep apnea in June 2022 and was subsequently placed on CPAP but she did not tolerate it.  She underwent left knee replacement in November of 2022.  She reports no significant improvement in her symptoms after surgery.  She has been doing well and denies any chest pain, shortness of breath or palpitations.  She has been under stress as her husband was recently diagnosed with esophageal cancer.    Past Medical History:  Diagnosis Date   Anxiety    Arthritis    Basal cell carcinoma (BCC) of forearm    Coronary artery disease    a.) LHC 01/21/2006:  100 % pRCA, 100% mRCA, 100% dRCA. PCI peformed; 31% mRCA - PCI complicated by spiral dissection --> 3.0x 97m Cypher DES to dRCA, 3.0x27mCypher DES to mRCA, and 3.5x23 mm Cypher DES pRCA. b.) 07/2020 MV: EF>65%, no ischemia/infarct.   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 Mildly dil LA. Triv MR.   Fibromyalgia    Gastritis    GERD (gastroesophageal reflux disease)    Hemorrhoids    History of kidney stones    Hyperlipidemia LDL goal <70    Hypertension    IBS (irritable bowel syndrome)    Lichen planus    Long term (current) use of antithrombotics/antiplatelets    a.) DAPT (ASA + clopidogrel)   Morbid obesity (HCC)    OSA (obstructive sleep apnea)    a.) "waiting on CPAP machine" as of 01/22/2021   Osteoporosis    Panic disorder     Past Surgical History:  Procedure Laterality Date   ABDOMINAL HYSTERECTOMY     BREAST BIOPSY Right 03/15/2017   PSEUDO-ANGIOMATOUS STROMAL HYPERPLASIA.    CHOLECYSTECTOMY     COLONOSCOPY     CORONARY ANGIOPLASTY WITH STENT PLACEMENT Left 01/21/2006   Procedure: CORONARY ANGIOPLASTY WITH STENT PLACEMENT (Cypher DES x 3 placed; 3.0 x 23 dRCA, 3.0 x 28 mRCA, 3.5 x 23 mm pRCA); [facility] AlIsaias CowmanMD   ESOPHAGOGASTRODUODENOSCOPY     FINGER ARTHRODESIS Left 03/26/2016  Procedure: ARTHRODESIS FINGER;  Surgeon: Hessie Knows, MD;  Location: [facility];  Service: Orthopedics;  Laterality: Left;   SKIN CANCER EXCISION     TOTAL KNEE ARTHROPLASTY Left 02/04/2021   Procedure: TOTAL KNEE ARTHROPLASTY;  Surgeon: Hessie Knows, MD;  Location: [facility];  Service: Orthopedics;  Laterality: Left;   TUBAL LIGATION       Current Outpatient Medications  Medication Sig Dispense Refill   aspirin EC 81 MG tablet Take 81 mg by mouth daily. Swallow whole.     b complex vitamins capsule Take 1 capsule by mouth daily.     cetirizine (ZYRTEC) 10 MG tablet Take 10 mg by mouth daily.      clopidogrel (PLAVIX) 75 MG tablet Take 75 mg by mouth daily.      Coenzyme Q10 (CO Q-10 PO) Take by mouth daily at 2 am.     ezetimibe (ZETIA) 10 MG tablet Take 1 tablet (10 mg total) by mouth daily. 90 tablet 3   famotidine (PEPCID) 20 MG tablet Take 40 mg by mouth at bedtime.     gabapentin (NEURONTIN) 100 MG capsule Take 100 mg by mouth as needed.     losartan (COZAAR) 25 MG tablet TAKE 1 TABLET (25 MG TOTAL) BY MOUTH DAILY. 90 tablet 3   Menthol, Topical Analgesic, (BIOFREEZE EX) Apply 1 application topically daily as needed (joint pain).     metoprolol (LOPRESSOR) 50 MG tablet Take 50 mg by mouth 2 (two) times daily.      metroNIDAZOLE (METROCREAM) 0.75 % cream Apply topically 2 (two) times daily. 45 g 0   pantoprazole (PROTONIX) 40 MG tablet Take 40 mg by mouth daily.     rosuvastatin (CRESTOR) 10 MG tablet Take 2 tablets (20 mg total) by mouth every evening.     sertraline (ZOLOFT) 50 MG tablet Take 50 mg by mouth daily before lunch.   12   No current facility-administered medications for this visit.    Allergies:   Molds & smuts and Other    Social History:  The patient  reports that she has never smoked. She has never used smokeless tobacco. She reports that she does not drink alcohol and does not use drugs.   Family History:  The patient's family history includes Breast cancer in her maternal aunt; Diabetes in her brother, mother, and sister; Heart failure in her mother.    ROS:  Please see the history of present illness.   Otherwise, review of systems are positive for none.   All other systems are reviewed and negative.    PHYSICAL EXAM: VS:  BP 120/80 (BP Location: Left Arm, Patient Position: Sitting, Cuff Size: Normal)   Pulse 66   Ht '5\' 2"'$  (1.575 m)   Wt 178 lb 4 oz (80.9 kg)   LMP  (LMP Unknown)   SpO2 98%   BMI 32.60 kg/m  , BMI Body mass index is 32.6 kg/m. GEN: Well nourished, well developed, in no acute distress  HEENT: normal  Neck: no JVD, carotid  bruits, or masses Cardiac: RRR; no murmurs, rubs, or gallops,no edema  Respiratory:  clear to auscultation bilaterally, normal work of breathing GI: soft, nontender, nondistended, + BS MS: no deformity or atrophy  Skin: warm and dry, no rash Neuro:  Strength and sensation are intact Psych: euthymic mood, full affect   EKG:  EKG is ordered today. EKG showed normal sinus rhythm.  No significant ST or T wave changes.   Recent Labs: No results found for  requested labs within last 365 days.    Lipid Panel No results found for: "CHOL", "TRIG", "HDL", "CHOLHDL", "VLDL", "LDLCALC", "LDLDIRECT"    Wt Readings from Last 3 Encounters:  05/01/22 178 lb 4 oz (80.9 kg)  07/18/21 171 lb (77.6 kg)  04/29/21 171 lb 6 oz (77.7 kg)           06/01/2019    4:00 PM  PAD Screen  Previous PAD dx? Yes  Previous surgical procedure? No  Pain with walking? Yes  Subsides with rest? No  Feet/toe relief with dangling? No  Painful, non-healing ulcers? No  Extremities discolored? No      ASSESSMENT AND PLAN:  1.  Coronary artery disease involving native coronary arteries without angina: She is doing well overall with no cardiac events since 2007.  Given placement of 3 overlapping Cypher stents, I recommend lifelong dual antiplatelet therapy.  She is already on aspirin and clopidogrel.    2.  Mixed hyperlipidemia: She could not afford Vascepa.  I reviewed most recent lipid profile done in May which showed an LDL of 57.  Triglyceride is chronically elevated at 289 which is slightly better than before.   3. Essential hypertension: Blood pressure is well controlled.  4.  Sleep apnea: Unfortunately, she reports intolerance to CPAP.    Disposition:   FU with me in 12 months  Signed,  Kathlyn Sacramento, MD  05/01/2022 [place]

## 2022-08-12 ENCOUNTER — Other Ambulatory Visit: Payer: Self-pay

## 2022-08-12 DIAGNOSIS — Z1231 Encounter for screening mammogram for malignant neoplasm of breast: Secondary | ICD-10-CM

## 2022-09-08 ENCOUNTER — Ambulatory Visit
Admission: RE | Admit: 2022-09-08 | Discharge: 2022-09-08 | Disposition: A | Discharge status: Home / Self Care | Payer: Medicare HMO | Source: Ambulatory Visit | Attending: Internal Medicine | Admitting: Internal Medicine

## 2022-09-08 DIAGNOSIS — Z1231 Encounter for screening mammogram for malignant neoplasm of breast: Secondary | ICD-10-CM | POA: Diagnosis not present

## 2022-11-18 ENCOUNTER — Ambulatory Visit: Payer: Medicare HMO | Admitting: Dermatology

## 2023-02-02 ENCOUNTER — Encounter: Payer: Self-pay | Admitting: Internal Medicine

## 2023-02-09 ENCOUNTER — Telehealth: Payer: Self-pay | Admitting: Cardiovascular Disease

## 2023-02-09 ENCOUNTER — Encounter: Payer: Self-pay | Admitting: Internal Medicine

## 2023-02-09 NOTE — Telephone Encounter (Signed)
Patient stated she will be having surgery with Dr. Odis Hollingshead and wants to know when she will need to stop her clopidogrel (PLAVIX) 75 MG tablet.

## 2023-02-09 NOTE — Telephone Encounter (Signed)
Pt called and she reports the office of Dr [name] had contacted her with the request: contact your cardiologist to ask when to stop taking Plavix  (Pt is scheduled for EGD on 11/19 at 11:25 with propofol.)  This RN explained to pt that this request should originate from the office of the MD performing the procedure and should be in the form of a surgical clearance.  I will send to MDs [facility] and [place]

## 2023-02-15 ENCOUNTER — Encounter: Payer: Self-pay | Admitting: Internal Medicine

## 2023-02-16 ENCOUNTER — Ambulatory Visit: Payer: Medicare HMO | Admitting: Anesthesiology

## 2023-02-16 ENCOUNTER — Ambulatory Visit
Admission: RE | Admit: 2023-02-16 | Discharge: 2023-02-16 | Disposition: A | Discharge status: Home / Self Care | Payer: Medicare HMO | Attending: Internal Medicine | Admitting: Internal Medicine

## 2023-02-16 ENCOUNTER — Other Ambulatory Visit: Payer: Self-pay

## 2023-02-16 ENCOUNTER — Encounter: Payer: Self-pay | Admitting: Internal Medicine

## 2023-02-16 ENCOUNTER — Encounter
Admission: RE | Disposition: A | Discharge status: Home / Self Care | Payer: Self-pay | Source: Home / Self Care | Attending: Internal Medicine

## 2023-02-16 DIAGNOSIS — Z955 Presence of coronary angioplasty implant and graft: Secondary | ICD-10-CM | POA: Insufficient documentation

## 2023-02-16 DIAGNOSIS — E66813 Obesity, class 3: Secondary | ICD-10-CM | POA: Insufficient documentation

## 2023-02-16 DIAGNOSIS — R1011 Right upper quadrant pain: Secondary | ICD-10-CM | POA: Diagnosis present

## 2023-02-16 DIAGNOSIS — K317 Polyp of stomach and duodenum: Secondary | ICD-10-CM | POA: Diagnosis not present

## 2023-02-16 DIAGNOSIS — Z7902 Long term (current) use of antithrombotics/antiplatelets: Secondary | ICD-10-CM | POA: Insufficient documentation

## 2023-02-16 DIAGNOSIS — Z79899 Other long term (current) drug therapy: Secondary | ICD-10-CM | POA: Insufficient documentation

## 2023-02-16 DIAGNOSIS — K2100 Gastro-esophageal reflux disease with esophagitis, without bleeding: Secondary | ICD-10-CM | POA: Diagnosis not present

## 2023-02-16 DIAGNOSIS — K2950 Unspecified chronic gastritis without bleeding: Secondary | ICD-10-CM | POA: Insufficient documentation

## 2023-02-16 DIAGNOSIS — I10 Essential (primary) hypertension: Secondary | ICD-10-CM | POA: Insufficient documentation

## 2023-02-16 DIAGNOSIS — I2510 Atherosclerotic heart disease of native coronary artery without angina pectoris: Secondary | ICD-10-CM | POA: Insufficient documentation

## 2023-02-16 DIAGNOSIS — K3189 Other diseases of stomach and duodenum: Secondary | ICD-10-CM | POA: Diagnosis not present

## 2023-02-16 DIAGNOSIS — Z86711 Personal history of pulmonary embolism: Secondary | ICD-10-CM | POA: Diagnosis not present

## 2023-02-16 DIAGNOSIS — Z6832 Body mass index (BMI) 32.0-32.9, adult: Secondary | ICD-10-CM | POA: Diagnosis not present

## 2023-02-16 HISTORY — PX: ESOPHAGOGASTRODUODENOSCOPY (EGD) WITH PROPOFOL: SHX5813

## 2023-02-16 HISTORY — PX: BIOPSY: SHX5522

## 2023-02-16 HISTORY — DX: Ulcer of esophagus without bleeding: K22.10

## 2023-02-16 HISTORY — DX: Deficiency of other specified B group vitamins: E53.8

## 2023-02-16 HISTORY — DX: Benign neoplasm, unspecified site: D36.9

## 2023-02-16 HISTORY — DX: Other specified abnormal findings of blood chemistry: R79.89

## 2023-02-16 SURGERY — ESOPHAGOGASTRODUODENOSCOPY (EGD) WITH PROPOFOL
Anesthesia: General

## 2023-02-16 MED ORDER — PROPOFOL 10 MG/ML IV BOLUS
INTRAVENOUS | Status: DC | PRN
  Administered 2023-02-16: 60 mg via INTRAVENOUS
  Administered 2023-02-16 (×2): 20 mg via INTRAVENOUS

## 2023-02-16 MED ORDER — LIDOCAINE HCL (CARDIAC) PF 100 MG/5ML IV SOSY
PREFILLED_SYRINGE | INTRAVENOUS | Status: DC | PRN
  Administered 2023-02-16: 100 mg via INTRAVENOUS

## 2023-02-16 MED ORDER — PROPOFOL 500 MG/50ML IV EMUL
INTRAVENOUS | Status: DC | PRN
  Administered 2023-02-16: 155 ug/kg/min via INTRAVENOUS

## 2023-02-16 MED ORDER — SODIUM CHLORIDE 0.9 % IV SOLN: INTRAVENOUS | Status: DC

## 2023-02-16 MED ORDER — EPHEDRINE SULFATE-NACL 50-0.9 MG/10ML-% IV SOSY: PREFILLED_SYRINGE | INTRAVENOUS | Status: DC | PRN

## 2023-02-16 MED ORDER — GLYCOPYRROLATE 0.2 MG/ML IJ SOLN
INTRAMUSCULAR | Status: DC | PRN
  Administered 2023-02-16: .2 mg via INTRAVENOUS

## 2023-02-16 NOTE — Op Note (Signed)
[facility] Patient Name: Judy Prince Procedure Date: 02/16/2023 12:16 PM MRN: [id number] Account #: [id number] Date of Birth: 1955-05-06 Admit Type: Outpatient Age: 67 Room: [facility] ENDO ROOM 3 Gender: Female Note Status: Finalized Instrument Name: Upper Endoscope [id number] Procedure:             Upper GI endoscopy Indications:           Abdominal pain in the right upper quadrant,                         Gastro-esophageal reflux disease, Reflux esophagitis Providers:             Boykin Nearing. [name] MD, MD Referring MD:          Danella Penton, MD (Referring MD) Medicines:             Propofol per Anesthesia Complications:         No immediate complications. Estimated blood loss:                         Minimal. Procedure:             Pre-Anesthesia Assessment:                        - Prior to the procedure, a History and Physical was                         performed, and patient medications, allergies and                         sensitivities were reviewed. The patient's tolerance                         of previous anesthesia was reviewed.                        - The risks and benefits of the procedure and the                         sedation options and risks were discussed with the                         patient. All questions were answered and informed                         consent was obtained.                        - Patient identification and proposed procedure were                         verified prior to the procedure by the nurse. The                         procedure was verified in the procedure room.                        - After reviewing the risks and benefits, the patient  was deemed in satisfactory condition to undergo the                         procedure.                        - ASA Grade Assessment: III - A patient with severe                         systemic disease.                        - After  reviewing the risks and benefits, the patient                         was deemed in satisfactory condition to undergo the                         procedure.                        After obtaining informed consent, the endoscope was                         passed under direct vision. Throughout the procedure,                         the patient's blood pressure, pulse, and oxygen                         saturations were monitored continuously. The Endoscope                         was introduced through the mouth, and advanced to the                         third part of duodenum. The upper GI endoscopy was                         accomplished without difficulty. The patient tolerated                         the procedure well. Findings:      The esophagus was normal.      Patchy mildly erythematous mucosa without bleeding was found in the       gastric body and in the gastric antrum. Two biopsies were obtained with       cold forceps for Helicobacter pylori testing in the gastric body.      Multiple small sessile polyps with no bleeding and no stigmata of recent       bleeding were found in the gastric body.      The examined duodenum was normal. Impression:            - Normal esophagus.                        - Erythematous mucosa in the gastric body and antrum.                        - Multiple gastric polyps.                        -  Normal examined duodenum.                        - Biopsies performed in the gastric body. Recommendation:        - Patient has a contact number available for                         emergencies. The signs and symptoms of potential                         delayed complications were discussed with the patient.                         Return to normal activities tomorrow. Written                         discharge instructions were provided to the patient.                        - Resume previous diet.                        - Continue present  medications.                        - Await pathology results.                        - Return to my office in 3 months.                        - The findings and recommendations were discussed with                         the patient. Procedure Code(s):     --- Professional ---                        [id number], Esophagogastroduodenoscopy, flexible,                         transoral; with biopsy, single or multiple Diagnosis Code(s):     --- Professional ---                        K21.00, Gastro-esophageal reflux disease with                         esophagitis, without bleeding                        R10.11, Right upper quadrant pain                        K31.7, Polyp of stomach and duodenum                        K31.89, Other diseases of stomach and duodenum CPT copyright 2022 [employer] Association. All rights reserved. The codes documented in this report are preliminary and upon coder review may  be revised to meet current compliance requirements. Stanton Kidney MD, MD 02/16/2023 12:26:37 PM This report has been  signed electronically. Number of Addenda: 0 Note Initiated On: 02/16/2023 12:16 PM Estimated Blood Loss:  Estimated blood loss was minimal.      [facility]

## 2023-02-16 NOTE — Anesthesia Procedure Notes (Signed)
Procedure Name: General with mask airway Date/Time: 02/16/2023 12:15 PM  Performed by: Mohammed Kindle, CRNAPre-anesthesia Checklist: Patient identified, Emergency Drugs available, Suction available and Patient being monitored Patient Re-evaluated:Patient Re-evaluated prior to induction Oxygen Delivery Method: Simple face mask Induction Type: IV induction Placement Confirmation: positive ETCO2, CO2 detector and breath sounds checked- equal and bilateral Dental Injury: Teeth and Oropharynx as per pre-operative assessment

## 2023-02-16 NOTE — H&P (Signed)
Outpatient short stay form Pre-procedure 02/16/2023 12:10 PM Judy Prince K. [name], M.D.  Primary Physician: [name], M.D.  Reason for visit:  GERD, RUQ pain, Erosive esophagitis  History of present illness:  Patient had recent acute on chronic RUQ and LUQ pain aggravated by meals. Was placed on Carafate by Dr. Hyacinth Meeker which has helped pain to some degree. Carafate has worsened constipation and bloating, however. She has not been taking famotidine the past few days since starting the carafate.  Patient takes Plavix for CAD w/ stents. Also on low dose ASA. Patient has history of hemorrhoids of which she denies any acute complications. Patient complains of chafing and around the natal cleft just above the buttocks as well as in the perianal region. She is using moist wipes and reports she is also using powder around the area. GERD for the most part stable although she does have some late night reflux when she eats before bedtime.  06/13/19: EGD (indic: GERD, abd pain) - LA-A reflux esophagitis. Many 1-2 mm esophageal ulcers, larged 2 mm (bx w/ squamous mucosa w/o histopathologic changes, negative for CMV/HSV). Multiple pedunculated and sessile fundic gland polyps, path + mild chronic gastritis. Normal duodenum. : CSY (indic: FHx polyps) - Two sessile and semi-pedunculated TAs in sigmoid and descending colon. Grade I internal hemorrhoids. Repeat in 05/2024. GI medications: -- Current: Pantoprazole 40 mg daily. Famotidine 20 mg nightly. -- Prior: Ranitidine 75 mg nightly.     Current Facility-Administered Medications:    0.9 %  sodium chloride infusion, , Intravenous, Continuous, [place], Boykin Nearing, MD, Last Rate: 20 mL/hr at 02/16/23 0956, New Bag at 02/16/23 0956  Medications Prior to Admission  Medication Sig Dispense Refill Last Dose   acetaminophen (TYLENOL) 500 MG tablet Take 500 mg by mouth every 6 (six) hours as needed for mild pain (pain score 1-3).   02/15/2023   aspirin EC 81 MG  tablet Take 81 mg by mouth daily. Swallow whole.   Past Week   b complex vitamins capsule Take 1 capsule by mouth daily.   02/15/2023   cetirizine (ZYRTEC) 10 MG tablet Take 10 mg by mouth daily.   02/15/2023   Coenzyme Q10 (CO Q-10 PO) Take by mouth daily at 2 am.   Past Week   ezetimibe (ZETIA) 10 MG tablet Take 1 tablet (10 mg total) by mouth daily. 90 tablet 3 02/15/2023   famotidine (PEPCID) 20 MG tablet Take 40 mg by mouth at bedtime.   02/15/2023   gabapentin (NEURONTIN) 100 MG capsule Take 100 mg by mouth as needed.   02/15/2023   losartan (COZAAR) 25 MG tablet TAKE 1 TABLET (25 MG TOTAL) BY MOUTH DAILY. 90 tablet 3 02/15/2023   metoprolol (LOPRESSOR) 50 MG tablet Take 50 mg by mouth 2 (two) times daily.    02/16/2023 at 0800   pantoprazole (PROTONIX) 40 MG tablet Take 40 mg by mouth daily.   02/15/2023   rosuvastatin (CRESTOR) 10 MG tablet Take 2 tablets (20 mg total) by mouth every evening.   02/15/2023   sertraline (ZOLOFT) 50 MG tablet Take 50 mg by mouth daily before lunch.   12 02/15/2023   clopidogrel (PLAVIX) 75 MG tablet Take 75 mg by mouth daily.    02/13/2023   Menthol, Topical Analgesic, (BIOFREEZE EX) Apply 1 application topically daily as needed (joint pain).        Allergies  Allergen Reactions   Molds & Smuts    Other      Past Medical History:  Diagnosis Date   Anxiety    Arthritis    B12 deficiency    Basal cell carcinoma (BCC) of forearm    Coronary artery disease    a.) LHC 01/21/2006: 100 % pRCA, 100% mRCA, 100% dRCA. PCI peformed; 80% mRCA - PCI complicated by spiral dissection --> 3.0x 23mm Cypher DES to dRCA, 3.0x51mm Cypher DES to mRCA, and 3.5x23 mm Cypher DES pRCA. b.) 07/2020 MV: EF>65%, no ischemia/infarct.   Cowden disease (HCC)    Diastolic dysfunction    a.) 07/2020 Echo: EF 60-65%, no rwma, G1DD, nl RV fxn. Mildly dil LA. Triv MR.   Esophagitis, erosive    Fibromyalgia    Gastritis    GERD (gastroesophageal reflux disease)    Hemorrhoids     History of kidney stones    Hyperlipidemia LDL goal <70    Hypertension    IBS (irritable bowel syndrome)    Lichen planus    Long term (current) use of antithrombotics/antiplatelets    a.) DAPT (ASA + clopidogrel)   Low serum vitamin D    Morbid obesity (HCC)    OSA (obstructive sleep apnea)    a.) "waiting on CPAP machine" as of 01/22/2021   Osteoporosis    Panic disorder    Tubular adenoma     Review of systems:  Otherwise negative.    Physical Exam  Gen: Alert, oriented. Appears stated age.  HEENT: [place]/AT. PERRLA. Lungs: CTA, no wheezes. CV: RR nl S1, S2. Abd: soft, benign, no masses. BS+ Ext: No edema. Pulses 2+    Planned procedures: Proceed with EGD. The patient understands the nature of the planned procedure, indications, risks, alternatives and potential complications including but not limited to bleeding, infection, perforation, damage to internal organs and possible oversedation/side effects from anesthesia. The patient agrees and gives consent to proceed.  Please refer to procedure notes for findings, recommendations and patient disposition/instructions.     Humzah Harty K. [name], M.D. Gastroenterology 02/16/2023  12:10 PM

## 2023-02-16 NOTE — Transfer of Care (Signed)
Immediate Anesthesia Transfer of Care Note  Patient: Judy Prince  Procedure(s) Performed: ESOPHAGOGASTRODUODENOSCOPY (EGD) WITH PROPOFOL BIOPSY  Patient Location: Endoscopy Unit  Anesthesia Type:General  Level of Consciousness: drowsy and patient cooperative  Airway & Oxygen Therapy: Patient Spontanous Breathing and Patient connected to face mask oxygen  Post-op Assessment: Report given to RN and Post -op Vital signs reviewed and stable  Post vital signs: Reviewed and stable  Last Vitals:  Vitals Value Taken Time  BP 148/72 02/16/23 1245  Temp 36.1 C 02/16/23 1225  Pulse 62 02/16/23 1249  Resp 15 02/16/23 1249  SpO2 100 % 02/16/23 1249  Vitals shown include unfiled device data.  Last Pain:  Vitals:   02/16/23 1245  TempSrc:   PainSc: 0-No pain         Complications: No notable events documented.

## 2023-02-16 NOTE — Anesthesia Preprocedure Evaluation (Signed)
Anesthesia Evaluation  Patient identified by MRN, date of birth, ID band Patient awake    Reviewed: Allergy & Precautions, H&P , NPO status , Patient's Chart, lab work & pertinent test results, reviewed documented beta blocker date and time   Airway Mallampati: II   Neck ROM: full    Dental  (+) Poor Dentition   Pulmonary neg pulmonary ROS   Pulmonary exam normal        Cardiovascular Exercise Tolerance: Good hypertension, On Medications + CAD  Normal cardiovascular exam Rhythm:regular Rate:Normal     Neuro/Psych  PSYCHIATRIC DISORDERS Anxiety      Neuromuscular disease    GI/Hepatic Neg liver ROS, PUD,GERD  Medicated,,  Endo/Other    Class 3 obesity  Renal/GU negative Renal ROS  negative genitourinary   Musculoskeletal   Abdominal   Peds  Hematology negative hematology ROS (+)   Anesthesia Other Findings Past Medical History: No date: Anxiety No date: Arthritis No date: B12 deficiency No date: Basal cell carcinoma (BCC) of forearm No date: Coronary artery disease     Comment:  a.) LHC 01/21/2006: 100 % pRCA, 100% mRCA, 100% dRCA.               PCI peformed; 80% mRCA - PCI complicated by spiral               dissection --> 3.0x 23mm Cypher DES to dRCA, 3.0x66mm               Cypher DES to mRCA, and 3.5x23 mm Cypher DES pRCA. b.)               07/2020 MV: EF>65%, no ischemia/infarct. 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               Mildly dil LA. Triv MR. No date: Esophagitis, erosive No date: Fibromyalgia No date: Gastritis No date: GERD (gastroesophageal reflux disease) No date: Hemorrhoids No date: History of kidney stones No date: Hyperlipidemia LDL goal <70 No date: Hypertension No date: IBS (irritable bowel syndrome) No date: Lichen planus No date: Long term (current) use of  antithrombotics/antiplatelets     Comment:  a.) DAPT (ASA + clopidogrel) No date: Low serum vitamin D No date: Morbid obesity (HCC) No date: OSA (obstructive sleep apnea)     Comment:  a.) "waiting on CPAP machine" as of 01/22/2021 No date: Osteoporosis No date: Panic disorder No date: Tubular adenoma Past Surgical History: No date: ABDOMINAL HYSTERECTOMY 03/15/2017: BREAST BIOPSY; Right     Comment:  PSEUDO-ANGIOMATOUS STROMAL HYPERPLASIA.  No date: CHOLECYSTECTOMY No date: COLONOSCOPY 01/21/2006: CORONARY ANGIOPLASTY WITH STENT PLACEMENT; Left     Comment:  Procedure: CORONARY ANGIOPLASTY WITH STENT PLACEMENT               (Cypher DES x 3 placed; 3.0 x 23 dRCA, 3.0 x 28 mRCA, 3.5              x 23 mm pRCA); Location: [facility]; Surgeon: Marcina Millard, MD No date: DILATION AND CURETTAGE OF UTERUS No date: ESOPHAGOGASTRODUODENOSCOPY 03/26/2016: FINGER ARTHRODESIS; Left     Comment:  Procedure: ARTHRODESIS FINGER;  Surgeon: Kennedy Bucker,               MD;  Location: [facility] ORS;  Service: Orthopedics;  Laterality: Left; No date: JOINT REPLACEMENT No date: SKIN CANCER EXCISION 02/04/2021: TOTAL KNEE ARTHROPLASTY; Left     Comment:  Procedure: TOTAL KNEE ARTHROPLASTY;  Surgeon: Kennedy Bucker, MD;  Location: [facility] ORS;  Service: Orthopedics;               Laterality: Left; No date: TUBAL LIGATION BMI    Body Mass Index: 32.74 kg/m     Reproductive/Obstetrics negative OB ROS                             Anesthesia Physical Anesthesia Plan  ASA: 3  Anesthesia Plan: General   Post-op Pain Management:    Induction:   PONV Risk Score and Plan:   Airway Management Planned:   Additional Equipment:   Intra-op Plan:   Post-operative Plan:   Informed Consent: I have reviewed the patients History and Physical, chart, labs and discussed the procedure including the risks, benefits and alternatives for the  proposed anesthesia with the patient or authorized representative who has indicated his/her understanding and acceptance.     Dental Advisory Given  Plan Discussed with: CRNA  Anesthesia Plan Comments:        Anesthesia Quick Evaluation

## 2023-02-16 NOTE — Interval H&P Note (Signed)
History and Physical Interval Note:  02/16/2023 12:13 PM  Judy Prince  has presented today for surgery, with the diagnosis of 530.19 (ICD-9-CM) - K22.10 (ICD-10-CM) - Esophagitis, erosive 530.81, 530.10 (ICD-9-CM) - K21.00 (ICD-10-CM) - Gastroesophageal reflux disease with esophagitis without hemorrhage 789.01, 338.29 (ICD-9-CM) - R10.11, G89.29 (ICD-10-CM) - Chronic RUQ pai.  The various methods of treatment have been discussed with the patient and family. After consideration of risks, benefits and other options for treatment, the patient has consented to  Procedure(s): ESOPHAGOGASTRODUODENOSCOPY (EGD) WITH PROPOFOL (N/A) as a surgical intervention.  The patient's history has been reviewed, patient examined, no change in status, stable for surgery.  I have reviewed the patient's chart and labs.  Questions were answered to the patient's satisfaction.     [place], [place]

## 2023-02-17 ENCOUNTER — Encounter: Payer: Self-pay | Admitting: Internal Medicine

## 2023-02-18 NOTE — Anesthesia Postprocedure Evaluation (Signed)
Anesthesia Post Note  Patient: Judy Prince  Procedure(s) Performed: ESOPHAGOGASTRODUODENOSCOPY (EGD) WITH PROPOFOL BIOPSY  Patient location during evaluation: PACU Anesthesia Type: General Level of consciousness: awake and alert Pain management: pain level controlled Vital Signs Assessment: post-procedure vital signs reviewed and stable Respiratory status: spontaneous breathing, nonlabored ventilation, respiratory function stable and patient connected to nasal cannula oxygen Cardiovascular status: blood pressure returned to baseline and stable Postop Assessment: no apparent nausea or vomiting Anesthetic complications: no   No notable events documented.   Last Vitals:  Vitals:   02/16/23 1235 02/16/23 1245  BP: 117/81 (!) 148/72  Pulse: 67 (!) 59  Resp: 16 13  Temp:    SpO2: 98% 98%    Last Pain:  Vitals:   02/17/23 0734  TempSrc:   PainSc: 0-No pain                 Yevette Edwards

## 2023-02-19 LAB — SURGICAL PATHOLOGY

## 2023-04-24 IMAGING — DX DG KNEE 1-2V*L*
2 series · 2 of 2 positions shown · non-contrast
Comparison: 01/04/2020

CLINICAL DATA: Left knee surgery

EXAM:
LEFT KNEE - 1-2 VIEW

[knee ap]
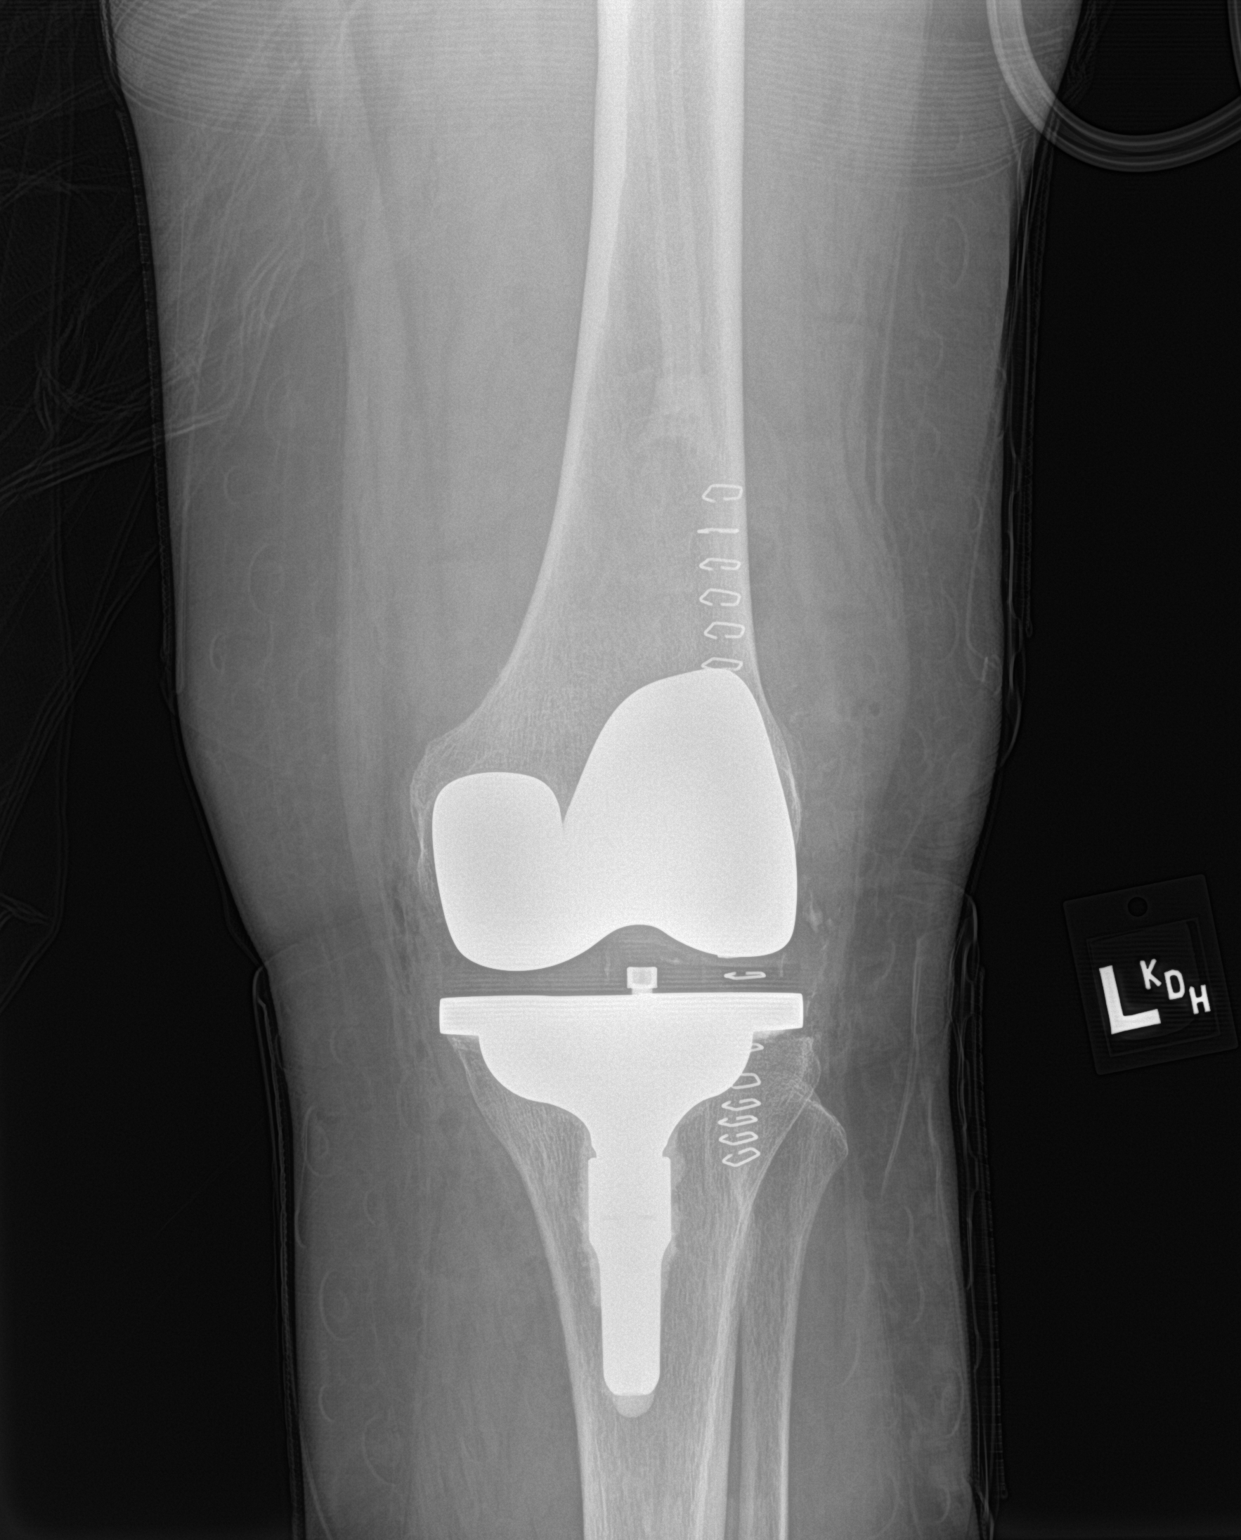

[knee lat]
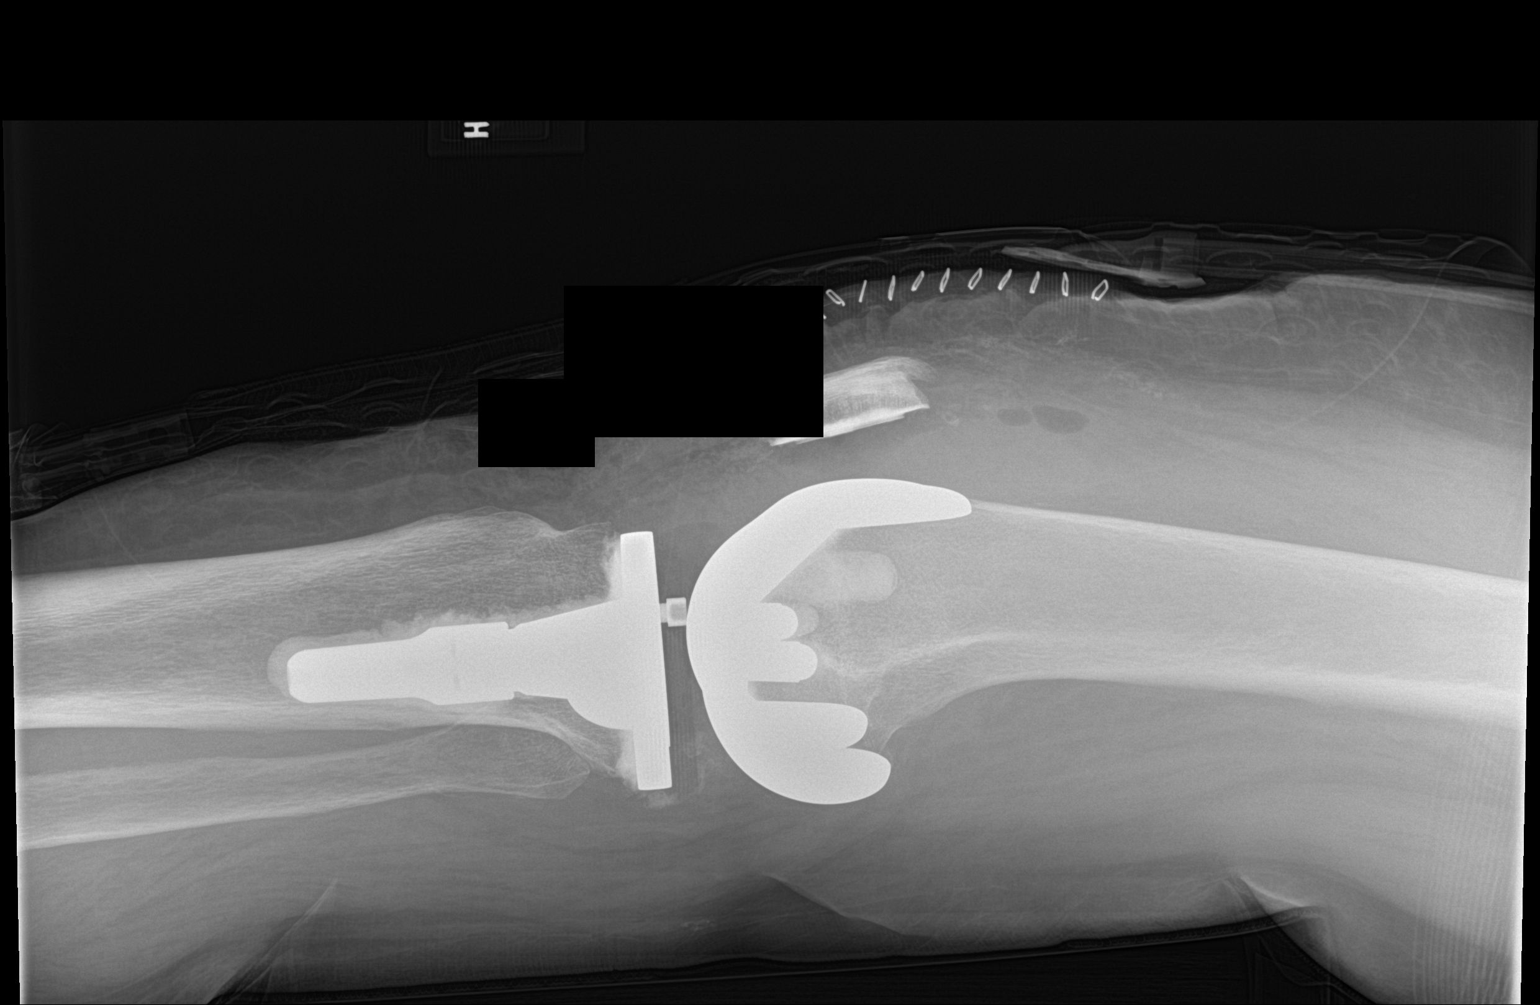

[2 of 2 positions shown; findings below may reference images not displayed]

FINDINGS: Interval postsurgical changes from left total knee arthroplasty.
Arthroplasty components are in their expected alignment. No
periprosthetic fracture or evidence of other complication. Expected
postoperative changes within the overlying soft tissues.
IMPRESSION: Interval postsurgical changes from left total knee arthroplasty.

## 2023-06-24 ENCOUNTER — Emergency Department
Admission: EM | Admit: 2023-06-24 | Discharge: 2023-06-24 | Disposition: A | Discharge status: Home / Self Care | Attending: Emergency Medicine | Admitting: Emergency Medicine

## 2023-06-24 ENCOUNTER — Emergency Department

## 2023-06-24 DIAGNOSIS — I493 Ventricular premature depolarization: Secondary | ICD-10-CM | POA: Diagnosis not present

## 2023-06-24 DIAGNOSIS — R002 Palpitations: Secondary | ICD-10-CM

## 2023-06-24 DIAGNOSIS — I2510 Atherosclerotic heart disease of native coronary artery without angina pectoris: Secondary | ICD-10-CM | POA: Insufficient documentation

## 2023-06-24 DIAGNOSIS — I10 Essential (primary) hypertension: Secondary | ICD-10-CM | POA: Insufficient documentation

## 2023-06-24 LAB — MAGNESIUM: Magnesium: 2.1 mg/dL (ref 1.7–2.4)

## 2023-06-24 LAB — CBC
HCT: 41 % (ref 36.0–46.0)
Hemoglobin: 13.8 g/dL (ref 12.0–15.0)
MCH: 30.4 pg (ref 26.0–34.0)
MCHC: 33.7 g/dL (ref 30.0–36.0)
MCV: 90.3 fL (ref 80.0–100.0)
Platelets: 224 10*3/uL (ref 150–400)
RBC: 4.54 MIL/uL (ref 3.87–5.11)
RDW: 12.2 % (ref 11.5–15.5)
WBC: 8.2 10*3/uL (ref 4.0–10.5)
nRBC: 0 % (ref 0.0–0.2)

## 2023-06-24 LAB — BASIC METABOLIC PANEL WITH GFR
Anion gap: 9 (ref 5–15)
BUN: 14 mg/dL (ref 8–23)
CO2: 25 mmol/L (ref 22–32)
Calcium: 9.2 mg/dL (ref 8.9–10.3)
Chloride: 105 mmol/L (ref 98–111)
Creatinine, Ser: 0.66 mg/dL (ref 0.44–1.00)
GFR, Estimated: >60 mL/min (ref >60)
Glucose, Bld: 111 mg/dL — ABNORMAL HIGH (ref 70–99)
Potassium: 3.7 mmol/L (ref 3.5–5.1)
Sodium: 139 mmol/L (ref 135–145)

## 2023-06-24 LAB — TROPONIN I (HIGH SENSITIVITY)
Troponin I (High Sensitivity): 12 ng/L (ref <18)
Troponin I (High Sensitivity): 5 ng/L (ref <18)

## 2023-06-24 MED ORDER — KETOROLAC TROMETHAMINE 30 MG/ML IJ SOLN
15.0000 mg | Freq: Once | INTRAMUSCULAR | Status: AC
  Administered 2023-06-24: 15 mg via INTRAVENOUS
  Filled 2023-06-24: qty 1

## 2023-06-24 NOTE — ED Provider Notes (Signed)
 [facility] Provider Note    Event Date/Time   First MD Initiated Contact with Patient 06/24/23 2054     (approximate)   History   Hypertension   HPI  Judy Prince is a 68 y.o. female who presents to the ED for evaluation of Hypertension   Review of PCP visit from October.  History of CAD, HTN, HLD, fibromyalgia and IBS. Also review an outpatient endoscopy from November, normal esophagus, erythematous mucosa of the gastric body, gastric polyps  Patient presents to the ED due to intermittent palpitations.  Due to this she checked her blood pressure and was elevated so she presents to the ED.  No dizziness, syncope or falls.  No chest pain.   Physical Exam   Triage Vital Signs: ED Triage Vitals  Encounter Vitals Group     BP 06/24/23 2043 (!) 166/96     Systolic BP Percentile --      Diastolic BP Percentile --      Pulse Rate 06/24/23 2043 79     Resp 06/24/23 2043 19     Temp 06/24/23 2043 98.2 F (36.8 C)     Temp Source 06/24/23 2043 Oral     SpO2 06/24/23 2043 100 %     Weight 06/24/23 2045 178 lb (80.7 kg)     Height 06/24/23 2045 5\' 2"  (1.575 m)     Head Circumference --      Peak Flow --      Pain Score 06/24/23 2044 10     Pain Loc --      Pain Education --      Exclude from Growth Chart --     Most recent vital signs: Vitals:   06/24/23 2211 06/24/23 2230  BP: (!) 171/66 (!) 157/71  Pulse: (!) 59 66  Resp: 15 18  Temp:    SpO2: 100% 97%    General: Awake, no distress.  Anxious but well-appearing, pleasant and conversational CV:  Good peripheral perfusion.  Resp:  Normal effort.  Abd:  No distention.  MSK:  No deformity noted.  Neuro:  No focal deficits appreciated. Other:     ED Results / Procedures / Treatments   Labs (all labs ordered are listed, but only abnormal results are displayed) Labs Reviewed  BASIC METABOLIC PANEL WITH GFR - Abnormal; Notable for the following components:      Result Value    Glucose, Bld 111 (*)    All other components within normal limits  CBC  MAGNESIUM  TROPONIN I (HIGH SENSITIVITY)  TROPONIN I (HIGH SENSITIVITY)    EKG Sinus rhythm with a rate of 71 bpm.  Normal axis and intervals.  No evidence of acute ischemia.  2 PVCs.  RADIOLOGY CXR interpreted by me without evidence of acute cardiopulmonary pathology.  Official radiology report(s): DG Chest 2 View Result Date: 06/24/2023 CLINICAL DATA:  Palpitations. EXAM: CHEST - 2 VIEW COMPARISON:  March 12, 2009 FINDINGS: The heart size and mediastinal contours are within normal limits. Very mild atelectasis is seen within the left lung base. There is no evidence of acute infiltrate, pleural effusion or pneumothorax. Radiopaque surgical clips are seen within the right upper quadrant. The visualized skeletal structures are unremarkable. IMPRESSION: No active cardiopulmonary disease. Electronically Signed   By: Aram Candela M.D.   On: 06/24/2023 21:25    PROCEDURES and INTERVENTIONS:  .1-3 Lead EKG Interpretation  Performed by: Delton Prairie, MD Authorized by: Delton Prairie, MD  Interpretation: normal     ECG rate:  70   ECG rate assessment: normal     Rhythm: sinus rhythm     Ectopy: none     Conduction: normal     Medications  ketorolac (TORADOL) 30 MG/ML injection 15 mg (15 mg Intravenous Given 06/24/23 2236)     IMPRESSION / MDM / ASSESSMENT AND PLAN / ED COURSE  I reviewed the triage vital signs and the nursing notes.  Differential diagnosis includes, but is not limited to, ACS, PTX, PNA, muscle strain/spasm, PE, dissection, anxiety, pleural effusion, symptomatic PVCs, electrolyte derangement  {Patient presents with symptoms of an acute illness or injury that is potentially life-threatening.  Patient presents with palpitations with a few PVCs on her twelve-lead but not a heavy burden noted on telemetry, otherwise with a benign workup and suitable for outpatient management.  Normal  electrolytes, negative troponin, clear CXR and reassuring CBC.  I considered admission for this patient but believe she is suitable for outpatient management overall.  Clinical Course as of 06/24/23 2322  Thu Jun 24, 2023  2321 Reassessed.  Feeling well.  We discussed reassuring workup and she is comfortable going home which I think is reasonable.  Discussed return precautions. [DS]    Clinical Course User Index [DS] Delton Prairie, MD     FINAL CLINICAL IMPRESSION(S) / ED DIAGNOSES   Final diagnoses:  Palpitations  PVC (premature ventricular contraction)     Rx / DC Orders   ED Discharge Orders     None        Note:  This document was prepared using Dragon voice recognition software and may include unintentional dictation errors.   Delton Prairie, MD 06/24/23 [id number]

## 2023-06-24 NOTE — ED Triage Notes (Signed)
 Pt arrived POV from home for feeling like her BP is elevated and feels she is having palpitations since last night. Reports has been doing some yard work and unsure if she may have "over did it". Denies CP or SOB, feeling "really anxious". Pt does have cardiac hx of 3 stents placements. A&O x4, NAD noted, VSS stable at current, HR 79. Reports generalized pain throughout hte day 10/10, hx of fibromyalgia.

## 2023-07-07 ENCOUNTER — Ambulatory Visit

## 2023-07-07 ENCOUNTER — Ambulatory Visit: Attending: Medical | Admitting: Medical

## 2023-07-07 ENCOUNTER — Encounter: Payer: Self-pay | Admitting: Medical

## 2023-07-07 VITALS — BP 116/78 | HR 67 | Ht 62.0 in | Wt 181.4 lb

## 2023-07-07 DIAGNOSIS — E785 Hyperlipidemia, unspecified: Secondary | ICD-10-CM | POA: Diagnosis not present

## 2023-07-07 DIAGNOSIS — I493 Ventricular premature depolarization: Secondary | ICD-10-CM

## 2023-07-07 DIAGNOSIS — I10 Essential (primary) hypertension: Secondary | ICD-10-CM | POA: Diagnosis not present

## 2023-07-07 DIAGNOSIS — I2510 Atherosclerotic heart disease of native coronary artery without angina pectoris: Secondary | ICD-10-CM

## 2023-07-07 DIAGNOSIS — Z79899 Other long term (current) drug therapy: Secondary | ICD-10-CM

## 2023-07-07 NOTE — Patient Instructions (Signed)
 Medication Instructions:   Your Physician recommend you continue on your current medication as directed.    *If you need a refill on your cardiac medications before your next appointment, please call your pharmacy*  Lab Work: Your provider would like for you to have following labs drawn today TSH.   If you have labs (blood work) drawn today and your tests are completely normal, you will receive your results only by: MyChart Message (if you have MyChart) OR A paper copy in the mail If you have any lab test that is abnormal or we need to change your treatment, we will call you to review the results.  Testing/Procedures:ZIO XT- Long Term Monitor Instructions  Your physician has requested you wear a ZIO patch monitor for 14 days.  This is a single patch monitor. [employer] supplies one patch monitor per enrollment. Additional stickers are not available. Please do not apply patch if you will be having a Nuclear Stress Test,  Echocardiogram, Cardiac CT, MRI, or Chest Xray during the period you would be wearing the  monitor. The patch cannot be worn during these tests. You cannot remove and re-apply the  ZIO XT patch monitor.  Your ZIO patch monitor will be mailed 3 day USPS to your address on file. It may take 3-5 days  to receive your monitor after you have been enrolled.  Once you have received your monitor, please review the enclosed instructions. Your monitor  has already been registered assigning a specific monitor serial # to you.  Billing and Patient Assistance Program Information  We have supplied [employer] with any of your insurance information on file for billing purposes. [employer] offers a sliding scale Patient Assistance Program for patients that do not have  insurance, or whose insurance does not completely cover the cost of the ZIO monitor.  You must apply for the Patient Assistance Program to qualify for this discounted rate.  To apply, please call [employer] at [phone], select  option 4, select option 2, ask to apply for  Patient Assistance Program. Meredeth Ide will ask your household income, and how many people  are in your household. They will quote your out-of-pocket cost based on that information.  [employer] will also be able to set up a 42-month, interest-free payment plan if needed.  Applying the monitor   Shave hair from upper left chest.  Hold abrader disc by orange tab. Rub abrader in 40 strokes over the upper left chest as  indicated in your monitor instructions.  Clean area with 4 enclosed alcohol pads. Let dry.  Apply patch as indicated in monitor instructions. Patch will be placed under collarbone on left  side of chest with arrow pointing upward.  Rub patch adhesive wings for 2 minutes. Remove white label marked "1". Remove the white  label marked "2". Rub patch adhesive wings for 2 additional minutes.  While looking in a mirror, press and release button in center of patch. A small green light will  flash 3-4 times. This will be your only indicator that the monitor has been turned on.  Do not shower for the first 24 hours. You may shower after the first 24 hours.  Press the button if you feel a symptom. You will hear a small click. Record Date, Time and  Symptom in the Patient Logbook.  When you are ready to remove the patch, follow instructions on the last 2 pages of Patient  Logbook. Stick patch monitor onto the last page of Patient Logbook.  Place Patient  Logbook in the blue and white box. Use locking tab on box and tape box closed  securely. The blue and white box has prepaid postage on it. Please place it in the mailbox as  soon as possible. Your physician should have your test results approximately 7 days after the  monitor has been mailed back to [facility].  Call [facility] Customer Care at [phone] if you have questions regarding  your ZIO XT patch monitor. Call them immediately if you see an orange light blinking on your  monitor.   If your monitor falls off in less than 4 days, contact our Monitor department at [phone].  If your monitor becomes loose or falls off after 4 days call [employer] at [phone] for  suggestions on securing your monitor   Follow-Up: At [facility], you and your health needs are our priority.  As part of our continuing mission to provide you with exceptional heart care, our providers are all part of one team.  This team includes your primary Cardiologist (physician) and Advanced Practice Providers or APPs (Physician Assistants and Nurse Practitioners) who all work together to provide you with the care you need, when you need it.  Your next appointment:   6-8 week(s)  Provider:   You may see Lorine Bears, MD or one of the following Advanced Practice Providers on your designated Care Team:   Nicolasa Ducking, NP Ames Dura, PA-C Eula Listen, PA-C Cadence Chester, PA-C Charlsie Quest, NP Carlos Levering, NP    We recommend signing up for the patient portal called "MyChart".  Sign up information is provided on this After Visit Summary.  MyChart is used to connect with patients for Virtual Visits (Telemedicine).  Patients are able to view lab/test results, encounter notes, upcoming appointments, etc.  Non-urgent messages can be sent to your provider as well.   To learn more about what you can do with MyChart, go to [email or web address].

## 2023-07-07 NOTE — Progress Notes (Signed)
 Cardiology Office Note:  .   Date:  07/07/2023  ID:  Judy Prince, DOB 1955-12-19, MRN [id number] PCP: Danella Penton, MD  [facility] Providers Cardiologist:  Lorine Bears, MD {    History of Present Illness: .   Judy Prince is a 68 y.o. female with a h/o CAD, mixed hyperlipidemia, hypertension, sleep apnea intolerant to CPAP who presents for 1 year follow-up.  In 2007 the patient had anginal symptoms and underwent cardiac cath which showed 70% mid RCA stenosis with moderate tortuosity in the vessel.  Attempted PCI was complicated by guiding catheter and do spiral dissection which required placement of 3 over left Cypher stents.  Lower extremity arterial Doppler in April 2021 showed normal ABI and toe pressure.  Atypical chest pain in April 2022 prompted Myoview Lexi scan which showed no evidence of ischemia with normal EF.  Echo showed normal LVSF, grade 1 diastolic dysfunction, mild TR.  She was diagnosed with sleep apnea in June 2022 but did not tolerate CPAP.  Patient was last seen in February 2024 and was overall doing well from a cardiac perspective.  She had been recently diagnosed with esophageal cancer.  Patient was seen in the ER 06/24/2023 for palpitations and hypertension with a blood pressure of 166/96. EKG shows NSR with PVCs. Labs unremarkable.   Today, the patient felt she was having palpitations the day she went to the ER. They started that same day. She denied chest pain or SOB.  She hasn't noticed it since.. She denies lightheadedness, dizziness, lower leg edema, orthopnea or pnd. She has shoulder, neck and back pain. She has been working in the yard before.    Studies Reviewed: Marland Kitchen   EKG Interpretation Date/Time:  Wednesday July 07 2023 13:37:31 EDT Ventricular Rate:  67 PR Interval:  134 QRS Duration:  88 QT Interval:  440 QTC Calculation: 464 R Axis:   11  Text Interpretation: Sinus rhythm with frequent Premature ventricular complexes When compared with  ECG of 24-Jun-2023 20:58, No significant change was found Confirmed by Fransico Michael, Hannan Hutmacher ([id number]) on 07/07/2023 1:44:51 PM    Echo 07/2020  1. Left ventricular ejection fraction, by estimation, is 60 to 65%. The  left ventricle has normal function. The left ventricle has no regional  wall motion abnormalities. Left ventricular diastolic parameters are  consistent with Grade I diastolic  dysfunction (impaired relaxation).   2. Right ventricular systolic function is normal. The right ventricular  size is normal.   3. Left atrial size was mildly dilated.   4. The mitral valve is normal in structure. Trivial mitral valve  regurgitation.   Carotid US 07/2020 Summary:  Right Carotid: There was no evidence of thrombus, dissection,  atherosclerotic                plaque or stenosis in the cervical carotid system.   Left Carotid: There was no evidence of thrombus, dissection,  atherosclerotic               plaque or stenosis in the cervical carotid system.   Vertebrals:  Bilateral vertebral arteries demonstrate antegrade flow.  Subclavians: Normal flow hemodynamics were seen in bilateral subclavian               arteries.   Myoview lexiscan 07/2020 Narrative & Impression  There was no ST segment deviation noted during stress. The study is normal. This is a low risk study. The left ventricular ejection fraction is hyperdynamic (>65%). There is no  evidence for ischemia        Physical Exam:   VS:  BP 116/78   Pulse 67   Ht 5\' 2"  (1.575 m)   Wt 181 lb 6.4 oz (82.3 kg)   LMP  (LMP Unknown)   SpO2 97%   BMI 33.18 kg/m    Wt Readings from Last 3 Encounters:  07/07/23 181 lb 6.4 oz (82.3 kg)  06/24/23 178 lb (80.7 kg)  02/16/23 179 lb (81.2 kg)    GEN: Well nourished, well developed in no acute distress NECK: No JVD; No carotid bruits CARDIAC: RRR, no murmurs, rubs, gallops RESPIRATORY:  Clear to auscultation without rales, wheezing or rhonchi  ABDOMEN: Soft, non-tender,  non-distended EXTREMITIES:  No edema; No deformity   ASSESSMENT AND PLAN: .    PVCs The patient was recently in the ER for palpitations. EKG shows NSR with PVCs and BP was high. She denies significant symptoms since that day. EKG today shows NSR with frequent PVCs. I will check a TSH. I will order a 2 week heart monitor. She takes metoprolol 50mg  BID.   CAD Myoview lexiscan in 2022 was low risk with no ischemia. She has MSK chest pain reproducible on exam. She is on long-term DAPT due to 3 overlapping stents. Continue ASA, Plavix, Crestor, Zetia, Lopressor  HTN BP normal today, continue metoprolol 50mg  BID and Losartan 25mg  daily.   HLD LDL 78, continue Crestor and Zetia.     Dispo: Follow-up in 6-8 weeks  Signed, Olesya Wike David Stall, PA-C

## 2023-07-08 LAB — TSH: TSH: 2.37 u[IU]/mL (ref 0.450–4.500)

## 2023-07-19 ENCOUNTER — Ambulatory Visit: Admitting: Dermatology

## 2023-07-19 ENCOUNTER — Encounter: Payer: Self-pay | Admitting: Dermatology

## 2023-07-19 DIAGNOSIS — L820 Inflamed seborrheic keratosis: Secondary | ICD-10-CM | POA: Diagnosis not present

## 2023-07-19 DIAGNOSIS — L739 Follicular disorder, unspecified: Secondary | ICD-10-CM

## 2023-07-19 DIAGNOSIS — L821 Other seborrheic keratosis: Secondary | ICD-10-CM

## 2023-07-19 DIAGNOSIS — W908XXA Exposure to other nonionizing radiation, initial encounter: Secondary | ICD-10-CM

## 2023-07-19 DIAGNOSIS — L719 Rosacea, unspecified: Secondary | ICD-10-CM

## 2023-07-19 DIAGNOSIS — L570 Actinic keratosis: Secondary | ICD-10-CM

## 2023-07-19 NOTE — Patient Instructions (Addendum)
 Treatment Plan: Recommend Hibiclens  to affected areas at scalp. Leave on for a few minutes before rinsing, may follow with regular shampoo and conditioner.   Treatment Plan Continue metronidazole  0.75% 1-2 times daily to face for rosacea.  Due to recent changes in healthcare laws, you may see results of your pathology and/or laboratory studies on MyChart before the doctors have had a chance to review them. We understand that in some cases there may be results that are confusing or concerning to you. Please understand that not all results are received at the same time and often the doctors may need to interpret multiple results in order to provide you with the best plan of care or course of treatment. Therefore, we ask that you please give us  2 business days to thoroughly review all your results before contacting the office for clarification. Should we see a critical lab result, you will be contacted sooner.   If You Need Anything After Your Visit  If you have any questions or concerns for your doctor, please call our main line at [phone] and press option 4 to reach your doctor's medical assistant. If no one answers, please leave a voicemail as directed and we will return your call as soon as possible. Messages left after 4 pm will be answered the following business day.   You may also send us  a message via MyChart. We typically respond to MyChart messages within 1-2 business days.  For prescription refills, please ask your pharmacy to contact our office. Our fax number is [phone].  If you have an urgent issue when the clinic is closed that cannot wait until the next business day, you can page your doctor at the number below.    Please note that while we do our best to be available for urgent issues outside of office hours, we are not available 24/7.   If you have an urgent issue and are unable to reach us , you may choose to seek medical care at your doctor's office, retail clinic,  urgent care center, or emergency room.  If you have a medical emergency, please immediately call 911 or go to the emergency department.  Pager Numbers  - Dr. Bary Likes: [phone]  - Dr. Annette Barters: [phone]  - Dr. Felipe Horton: [phone]   In the event of inclement weather, please call our main line at [phone] for an update on the status of any delays or closures.  Dermatology Medication Tips: Please keep the boxes that topical medications come in in order to help keep track of the instructions about where and how to use these. Pharmacies typically print the medication instructions only on the boxes and not directly on the medication tubes.   If your medication is too expensive, please contact our office at [phone] option 4 or send us  a message through MyChart.   We are unable to tell what your co-pay for medications will be in advance as this is different depending on your insurance coverage. However, we may be able to find a substitute medication at lower cost or fill out paperwork to get insurance to cover a needed medication.   If a prior authorization is required to get your medication covered by your insurance company, please allow us  1-2 business days to complete this process.  Drug prices often vary depending on where the prescription is filled and some pharmacies may offer cheaper prices.  The website www.goodrx.com contains coupons for medications through different pharmacies. The prices here do not account for what the cost  may be with help from insurance (it may be cheaper with your insurance), but the website can give you the price if you did not use any insurance.  - You can print the associated coupon and take it with your prescription to the pharmacy.  - You may also stop by our office during regular business hours and pick up a GoodRx coupon card.  - If you need your prescription sent electronically to a different pharmacy, notify our office through [facility] or by phone at [phone] option 4.     Si Usted Necesita Algo Despus de Su Visita  Tambin puede enviarnos un mensaje a travs de Clinical cytogeneticist. Por lo general respondemos a los mensajes de MyChart en el transcurso de 1 a 2 das hbiles.  Para renovar recetas, por favor pida a su farmacia que se ponga en contacto con nuestra oficina. Franz Jacks de fax es [place] [phone].  Si tiene un asunto urgente cuando la clnica est cerrada y que no puede esperar hasta el siguiente da hbil, puede llamar/localizar a su doctor(a) al nmero que aparece a continuacin.   Por favor, tenga en cuenta que aunque hacemos todo lo posible para estar disponibles para asuntos urgentes fuera del horario de [place], no estamos disponibles las 24 horas del da, los 7 [address]  [address] de la [place].   Si tiene un problema urgente y no puede comunicarse con nosotros, puede optar por buscar atencin mdica  en el consultorio de su doctor(a), en una clnica privada, en un centro de atencin urgente o en una sala de emergencias.  Si tiene Engineer, drilling, por favor llame inmediatamente al 911 o vaya a la sala de emergencias.  Nmeros de bper  - Dr. Bary Likes: [phone]  - Dra. Annette Barters: [phone]  - Dr. Felipe Horton: [phone]   En caso de inclemencias del tiempo, por favor llame a Lajuan Pila principal al [phone] para una actualizacin sobre el [place] de cualquier retraso o cierre.  Consejos para la medicacin en dermatologa: Por favor, guarde las cajas en las que vienen los medicamentos de uso tpico para ayudarle a seguir las instrucciones sobre dnde y cmo usarlos. Las farmacias generalmente imprimen las instrucciones del medicamento slo en las cajas y no directamente en los tubos del [place].   Si su medicamento es muy caro, por favor, pngase en contacto con Bettyjane Brunet llamando al [phone] y presione la opcin 4 o envenos un mensaje a travs de Clinical cytogeneticist.   No podemos decirle cul  ser su copago por los medicamentos por adelantado ya que esto es diferente dependiendo de la cobertura de su seguro. Sin embargo, es posible que podamos encontrar un medicamento sustituto a Audiological scientist un formulario para que el seguro cubra el medicamento que se considera necesario.   Si se requiere una autorizacin previa para que su compaa de seguros [place] su medicamento, por favor permtanos de 1 a 2 das hbiles para completar este proceso.  Los precios de los medicamentos varan con frecuencia dependiendo del Environmental consultant de dnde se surte la receta y alguna farmacias pueden ofrecer precios ms baratos.  El sitio web www.goodrx.com tiene cupones para medicamentos de Health and safety inspector. Los precios aqu no tienen en cuenta lo que podra costar con la ayuda del seguro (puede ser ms barato con su seguro), pero el sitio web puede darle el precio si no utiliz Tourist information centre manager.  - Puede imprimir el cupn correspondiente y llevarlo con su receta a la farmacia.  - Tambin puede pasar por  nuestra oficina durante el horario de atencin regular y recoger una tarjeta de cupones de GoodRx.  - Si necesita que su receta se enve electrnicamente a una farmacia diferente, informe a nuestra oficina a travs de MyChart de [place] o por telfono llamando al [phone] y presione la opcin 4.

## 2023-07-19 NOTE — Progress Notes (Signed)
 Follow-Up Visit   Subjective  Judy Prince is a 68 y.o. female who presents for the following: itching and burning scalp. Patient switched to Aveeno shampoo. When patient saw Dr. 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 She had some spots at face when she made appt but they have improved. 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 Tresa Frohlich.   Patient was given clindamycin 05/2020 for folliculitis at scalp.  The patient has spots, moles and lesions to be evaluated, some may be new or changing and the patient may have concern these could be cancer.   The following portions of the chart were reviewed this encounter and updated as appropriate: medications, allergies, medical history  Review of Systems:  No other skin or systemic complaints except as noted in HPI or Assessment and Plan.  Objective  Well appearing patient in no apparent distress; mood and affect are within normal limits.   A focused examination was performed of the following areas: Face, arms, shoulders, chest  Relevant exam findings are noted in the Assessment and Plan.  Right Forehead Erythematous thin papules/macules with gritty scale.  L chest x 1 Erythematous stuck-on, waxy papule or plaque  Assessment & Plan   FOLLICULITIS Exam: Perifollicular erythematous papules at scalp  Folliculitis occurs due to inflammation of the superficial hair follicle (pore), resulting in acne-like lesions (pus bumps). It can be infectious (bacterial, fungal) or noninfectious (shaving, tight clothing, heat/sweat, medications).  Folliculitis can be acute or chronic and recommended treatment depends on the underlying cause of folliculitis.  Treatment Plan: Recommend Hibiclens  to aa scalp in shower few times per week. Leave on for a few minutes before rinsing, may follow with regular shampoo and conditioner.   ROSACEA Exam Mid face erythema with  telangiectasias + inflammatory papule on right chin  Chronic and persistent condition with duration or expected duration over one year. Condition is symptomatic/ bothersome to patient. Not currently at goal.  Rosacea is a chronic progressive skin condition usually affecting the face of adults, causing redness and/or acne bumps. It is treatable but not curable. It sometimes affects the eyes (ocular rosacea) as well. It may respond to topical and/or systemic medication and can flare with stress, sun exposure, alcohol, exercise, topical steroids (including hydrocortisone/cortisone 10) and some foods.  Daily application of broad spectrum spf 30+ sunscreen to face is recommended to reduce flares.   Treatment Plan Continue metronidazole  0.75% 1-2 times daily.  RTC if not controlled  SEBORRHEIC KERATOSIS - Stuck-on, waxy, tan-brown papules and/or plaques at left shoulder - Benign-appearing - Discussed benign etiology and prognosis. - Observe - Call for any changes    AK (ACTINIC KERATOSIS) Right Forehead Actinic keratoses are precancerous spots that appear secondary to cumulative UV radiation exposure/sun exposure over time. They are chronic with expected duration over 1 year. A portion of actinic keratoses will progress to squamous cell carcinoma of the skin. It is not possible to reliably predict which spots will progress to skin cancer and so treatment is recommended to prevent development of skin cancer.  Recommend daily broad spectrum sunscreen SPF 30+ to sun-exposed areas, reapply every 2 hours as needed.  Recommend staying in the shade or wearing long sleeves, sun glasses (UVA+UVB protection) and wide brim hats (4-inch brim around the entire circumference of the hat). Call for new or changing lesions.  Patient prefers to monitor. Offered cryo INFLAMED SEBORRHEIC KERATOSIS L chest x  1 Symptomatic, irritating, patient would like treated.  Benign-appearing.  Call clinic for new or  changing lesions.   Destruction of lesion - L chest x 1 Complexity: simple   Destruction method: cryotherapy   Informed consent: discussed and consent obtained   Timeout:  patient name, date of birth, surgical site, and procedure verified Lesion destroyed using liquid nitrogen: Yes   Region frozen until ice ball extended beyond lesion: Yes   Cryo cycles: 1 or 2. Outcome: patient tolerated procedure well with no complications   Post-procedure details: wound care instructions given   ROSACEA   FOLLICULITIS   SEBORRHEIC KERATOSES    Return in about 1 year (around 07/18/2024) for Rosacea, AK follow up.  Kerstin Peeling, RMA, am acting as scribe for Harris Liming, MD .   Documentation: I have reviewed the above documentation for accuracy and completeness, and I agree with the above.  Harris Liming, MD

## 2023-07-25 ENCOUNTER — Other Ambulatory Visit: Payer: Self-pay

## 2023-07-25 ENCOUNTER — Emergency Department
Admission: EM | Admit: 2023-07-25 | Discharge: 2023-07-25 | Disposition: A | Discharge status: Home / Self Care | Attending: Emergency Medicine | Admitting: Emergency Medicine

## 2023-07-25 ENCOUNTER — Encounter: Payer: Self-pay | Admitting: Emergency Medicine

## 2023-07-25 ENCOUNTER — Emergency Department

## 2023-07-25 DIAGNOSIS — R002 Palpitations: Secondary | ICD-10-CM | POA: Insufficient documentation

## 2023-07-25 DIAGNOSIS — R0789 Other chest pain: Secondary | ICD-10-CM | POA: Insufficient documentation

## 2023-07-25 DIAGNOSIS — I10 Essential (primary) hypertension: Secondary | ICD-10-CM | POA: Insufficient documentation

## 2023-07-25 LAB — BASIC METABOLIC PANEL WITH GFR
Anion gap: 11 (ref 5–15)
BUN: 18 mg/dL (ref 8–23)
CO2: 23 mmol/L (ref 22–32)
Calcium: 9 mg/dL (ref 8.9–10.3)
Chloride: 106 mmol/L (ref 98–111)
Creatinine, Ser: 0.58 mg/dL (ref 0.44–1.00)
GFR, Estimated: >60 mL/min (ref >60)
Glucose, Bld: 100 mg/dL — ABNORMAL HIGH (ref 70–99)
Potassium: 3.4 mmol/L — ABNORMAL LOW (ref 3.5–5.1)
Sodium: 140 mmol/L (ref 135–145)

## 2023-07-25 LAB — CBC
HCT: 41.9 % (ref 36.0–46.0)
Hemoglobin: 14 g/dL (ref 12.0–15.0)
MCH: 30.4 pg (ref 26.0–34.0)
MCHC: 33.4 g/dL (ref 30.0–36.0)
MCV: 91.1 fL (ref 80.0–100.0)
Platelets: 259 10*3/uL (ref 150–400)
RBC: 4.6 MIL/uL (ref 3.87–5.11)
RDW: 12.6 % (ref 11.5–15.5)
WBC: 7.7 10*3/uL (ref 4.0–10.5)
nRBC: 0 % (ref 0.0–0.2)

## 2023-07-25 LAB — MAGNESIUM: Magnesium: 2.1 mg/dL (ref 1.7–2.4)

## 2023-07-25 LAB — TSH: TSH: 6.194 u[IU]/mL — ABNORMAL HIGH (ref 0.350–4.500)

## 2023-07-25 LAB — T4, FREE: Free T4: 0.69 ng/dL (ref 0.61–1.12)

## 2023-07-25 LAB — TROPONIN I (HIGH SENSITIVITY)
Troponin I (High Sensitivity): 5 ng/L (ref <18)
Troponin I (High Sensitivity): 6 ng/L (ref <18)

## 2023-07-25 NOTE — ED Triage Notes (Signed)
 Pt presents to the ED via POV with complaints of palpitations - Pt wearing a halter monitor and has Hx of stent placement. Pt states that she was asleep and was woken up by the palpitations and tachycardia.  No meds taken PTA. A&Ox4 at this time. Denies SOB.

## 2023-07-25 NOTE — Discharge Instructions (Signed)
 Fortunately your evaluation the emergency department did not show any emergent conditions accounting for your chest palpitations.  Continue to use the monitor as directed by your cardiologist and follow-up with them.  Thank you for choosing us  for your health care today!  Please see your primary doctor this week for a follow up appointment.   If you have any new, worsening, or unexpected symptoms call your doctor right away or come back to the emergency department for reevaluation.  It was my pleasure to care for you today.   Arron Large Margery Sheets, MD

## 2023-07-25 NOTE — ED Provider Notes (Signed)
 [facility] Provider Note    Event Date/Time   First MD Initiated Contact with Patient 07/25/23 0149     (approximate)   History   Palpitations   HPI  Judy Prince is a 68 y.o. female   Past medical history of hypertension, hyperlipidemia, here with palpitations.  She gets occasional skipped beat sensations and heart pounding racing sensations and she has seen Dr. Alvenia Aus and currently has a Holter monitor for further evaluation.  During bedtime tonight she had a similar occurrence where her heart felt like it was racing and beating fast and she had some discomfort in her chest.  This lasted couple minutes and spontaneously resolved.  She feels completely fine now.  On review of systems, diet has been okay, no nausea vomiting or diarrhea, no chest pain or shortness of breath or respiratory infectious symptoms between these episodes.  Independent Historian contributed to assessment above: Husband at bedside corroborates information past medical history as above    Physical Exam   Triage Vital Signs: ED Triage Vitals  Encounter Vitals Group     BP 07/25/23 0110 (!) 187/77     Systolic BP Percentile --      Diastolic BP Percentile --      Pulse Rate 07/25/23 0110 84     Resp 07/25/23 0110 18     Temp 07/25/23 0110 98.3 F (36.8 C)     Temp Source 07/25/23 0110 Oral     SpO2 07/25/23 0110 98 %     Weight 07/25/23 0101 180 lb (81.6 kg)     Height 07/25/23 0101 5\' 2"  (1.575 m)     Head Circumference --      Peak Flow --      Pain Score 07/25/23 0101 5     Pain Loc --      Pain Education --      Exclude from Growth Chart --     Most recent vital signs: Vitals:   07/25/23 0110  BP: (!) 187/77  Pulse: 84  Resp: 18  Temp: 98.3 F (36.8 C)  SpO2: 98%    General: Awake, no distress.  CV:  Good peripheral perfusion. Resp:  Normal effort.  Abd:  No distention.  Other:  Well-appearing no acute distress, slightly hypertensive, otherwise vital  signs normal.  Normal rate and rhythm on my auscultation.  Lungs clear.  Skin appears warm well-perfused.   ED Results / Procedures / Treatments   Labs (all labs ordered are listed, but only abnormal results are displayed) Labs Reviewed  BASIC METABOLIC PANEL WITH GFR - Abnormal; Notable for the following components:      Result Value   Potassium 3.4 (*)    Glucose, Bld 100 (*)    All other components within normal limits  TSH - Abnormal; Notable for the following components:   TSH 6.194 (*)    All other components within normal limits  CBC  MAGNESIUM   T4, FREE  TROPONIN I (HIGH SENSITIVITY)     I ordered and reviewed the above labs they are notable for cell counts electrolytes thyroid unremarkable.  Initial troponin negative.  EKG  ED ECG REPORT I, Buell Carmin, the attending physician, personally viewed and interpreted this ECG.   Date: 07/25/2023  EKG Time: 0111  Rate: 77  Rhythm: sinus  Axis: nl  Intervals:nl  ST&T Change: no stemi    RADIOLOGY I independently reviewed and interpreted chest x-ray and I see no obvious focality pneumothorax  I also reviewed radiologist's formal read.   PROCEDURES:  Critical Care performed: No  Procedures   MEDICATIONS ORDERED IN ED: Medications - No data to display   IMPRESSION / MDM / ASSESSMENT AND PLAN / ED COURSE  I reviewed the triage vital signs and the nursing notes.                                Patient's presentation is most consistent with acute presentation with potential threat to life or bodily function.  Differential diagnosis includes, but is not limited to, dysrhythmia, electrolyte disturbance, ACS   The patient is on the cardiac monitor to evaluate for evidence of arrhythmia and/or significant heart rate changes.  MDM:    She has had palpitations and is being screened by cardiologist with Holter monitor in place and a reoccurrence of her palpitations tonight associated with some chest discomfort  that has now completely resolved.  No other acute medical complaints is otherwise felt well.  She has no shortness of breath.  I doubt PE.  I considered ACS given her chest discomfort and her age and medical comorbidities cardiac risk factors but fortunately EKG is nonischemic with no malignant dysrhythmias noted and initial troponin is normal.  I checked electrolytes and thyroid function which looked normal.  I think she should stay for another troponin and if normal, can be discharged with cardiology follow-up and return precautions given.         FINAL CLINICAL IMPRESSION(S) / ED DIAGNOSES   Final diagnoses:  Palpitations     Rx / DC Orders   ED Discharge Orders     None        Note:  This document was prepared using Dragon voice recognition software and may include unintentional dictation errors.    Buell Carmin, MD 07/25/23 [phone]

## 2023-07-30 DIAGNOSIS — I493 Ventricular premature depolarization: Secondary | ICD-10-CM

## 2023-08-13 ENCOUNTER — Other Ambulatory Visit: Payer: Self-pay | Admitting: Internal Medicine

## 2023-08-13 DIAGNOSIS — Z1231 Encounter for screening mammogram for malignant neoplasm of breast: Secondary | ICD-10-CM

## 2023-09-01 ENCOUNTER — Ambulatory Visit: Admitting: Medical

## 2023-09-09 ENCOUNTER — Ambulatory Visit
Admission: RE | Admit: 2023-09-09 | Discharge: 2023-09-09 | Disposition: A | Discharge status: Home / Self Care | Source: Ambulatory Visit | Attending: Internal Medicine | Admitting: Internal Medicine

## 2023-09-09 DIAGNOSIS — Z1231 Encounter for screening mammogram for malignant neoplasm of breast: Secondary | ICD-10-CM | POA: Insufficient documentation

## 2023-11-19 ENCOUNTER — Telehealth: Payer: Self-pay | Admitting: Cardiovascular Disease

## 2023-11-19 ENCOUNTER — Telehealth: Payer: Self-pay

## 2023-11-19 NOTE — Telephone Encounter (Signed)
  Patient Consent for Virtual Visit        Judy Prince has provided verbal consent on 11/19/2023 for a virtual visit (video or telephone).   CONSENT FOR VIRTUAL VISIT FOR:  Judy Prince  By participating in this virtual visit I agree to the following:  I hereby voluntarily request, consent and authorize [facility] and its employed or contracted physicians, physician assistants, nurse practitioners or other licensed health care professionals (the Practitioner), to provide me with telemedicine health care services (the "Services) as deemed necessary by the treating Practitioner. I acknowledge and consent to receive the Services by the Practitioner via telemedicine. I understand that the telemedicine visit will involve communicating with the Practitioner through live audiovisual communication technology and the disclosure of certain medical information by electronic transmission. I acknowledge that I have been given the opportunity to request an in-person assessment or other available alternative prior to the telemedicine visit and am voluntarily participating in the telemedicine visit.  I understand that I have the right to withhold or withdraw my consent to the use of telemedicine in the course of my care at any time, without affecting my right to future care or treatment, and that the Practitioner or I may terminate the telemedicine visit at any time. I understand that I have the right to inspect all information obtained and/or recorded in the course of the telemedicine visit and may receive copies of available information for a reasonable fee.  I understand that some of the potential risks of receiving the Services via telemedicine include:  Delay or interruption in medical evaluation due to technological equipment failure or disruption; Information transmitted may not be sufficient (e.g. poor resolution of images) to allow for appropriate medical decision making by the Practitioner;  and/or  In rare instances, security protocols could fail, causing a breach of personal health information.  Furthermore, I acknowledge that it is my responsibility to provide information about my medical history, conditions and care that is complete and accurate to the best of my ability. I acknowledge that Practitioner's advice, recommendations, and/or decision may be based on factors not within their control, such as incomplete or inaccurate data provided by me or distortions of diagnostic images or specimens that may result from electronic transmissions. I understand that the practice of medicine is not an exact science and that Practitioner makes no warranties or guarantees regarding treatment outcomes. I acknowledge that a copy of this consent can be made available to me via my patient portal [facility] MyChart), or I can request a printed copy by calling the office of  [facility].    I understand that my insurance will be billed for this visit.   I have read or had this consent read to me. I understand the contents of this consent, which adequately explains the benefits and risks of the Services being provided via telemedicine.  I have been provided ample opportunity to ask questions regarding this consent and the Services and have had my questions answered to my satisfaction. I give my informed consent for the services to be provided through the use of telemedicine in my medical care

## 2023-11-19 NOTE — Telephone Encounter (Signed)
   Name: Judy Prince  DOB: 10/01/1955  MRN: [id number]  Primary Cardiologist: Deatrice Cage, MD   Preoperative team, please contact this patient and set up a phone call appointment for further preoperative risk assessment. Please obtain consent and complete medication review. Thank you for your help.  I confirm that guidance regarding antiplatelet and oral anticoagulation therapy has been completed and, if necessary, noted below.  Patient is on Plavix  but not requested by surgeon. Need clarification on this for VV.   I also confirmed the patient resides in the state of [place] . As per [facility] Medical Board telemedicine laws, the patient must reside in the state in which the provider is licensed.   Lamarr Satterfield, NP 11/19/2023, 4:15 PM [facility]

## 2023-11-19 NOTE — Telephone Encounter (Signed)
 Preop tele appt now scheduled, med rec and consent done.

## 2023-11-19 NOTE — Telephone Encounter (Signed)
   Pre-operative Risk Assessment    Patient Name: Judy Prince  DOB: 10/14/1955 MRN: [id number]   Date of last office visit: 07/07/23 Date of next office visit: n/a   Request for Surgical Clearance    Procedure:  left foot bunion  Date of Surgery:  Clearance TBD                                Surgeon:  Eva Gay Surgeon's Group or Practice Name:  [facility] Phone number:  [phone] Fax number:  [phone]   Type of Clearance Requested:   - Medical    Type of Anesthesia:  Not Indicated   Additional requests/questions:    [name]   11/19/2023, 4:07 PM

## 2023-12-06 ENCOUNTER — Ambulatory Visit

## 2023-12-06 NOTE — Telephone Encounter (Signed)
 Pt asked to move her tele preop appt out as her procedure date has changed to 01/21/24. New tele appt is 01/10/24.

## 2023-12-06 NOTE — Telephone Encounter (Signed)
 Patient is calling to cancel her televisit appointment for today. Patient stated her surgery date was moved to 01/21/24. Patient would like to wait till closer to time to receive the clearance. Please advise.

## 2023-12-06 NOTE — Progress Notes (Deleted)
 Virtual Visit via Telephone Note   Because of Judy Prince co-morbid illnesses, she is at least at moderate risk for complications without adequate follow up.  This format is felt to be most appropriate for this patient at this time.  Due to technical limitations with video connection Web designer), today's appointment will be conducted as an audio only telehealth visit, and Judy Prince verbally agreed to proceed in this manner.   All issues noted in this document were discussed and addressed.  No physical exam could be performed with this format.  Evaluation Performed:  Preoperative cardiovascular risk assessment _____________   Date:  12/06/2023   Patient ID:  Judy Prince, DOB 23-Aug-1955, MRN [id number] Patient Location:  Home Provider location:   Office  Primary Care Provider:  Cleotilde Oneil FALCON, MD Primary Cardiologist:  Deatrice Cage, MD  Chief Complaint / Patient Profile   68 y.o. y/o female with a h/o coronary artery disease, hyperlipidemia, OSA who is pending left foot bunionectomy and presents today for telephonic preoperative cardiovascular risk assessment.  History of Present Illness    Judy Prince is a 68 y.o. female who presents via Web designer for a telehealth visit today.  Pt was last seen in cardiology clinic on 07/07/2023 by [name] PA-C.  At that time Judy Prince was doing well .  The patient is now pending procedure as outlined above. Since her last visit, she continues to be stable from a cardiac standpoint.  Today she denies chest pain, shortness of breath, lower extremity edema, fatigue, palpitations, melena, hematuria, hemoptysis, diaphoresis, weakness, presyncope, syncope, orthopnea, and PND.   Past Medical History    Past Medical History:  Diagnosis Date   Anxiety    Arthritis    B12 deficiency    Basal cell carcinoma (BCC) of forearm    Coronary artery disease    a.) LHC 01/21/2006: 100 % pRCA, 100% mRCA, 100% dRCA. PCI  peformed; 80% mRCA - PCI complicated by spiral dissection --> 3.0x 23mm Cypher DES to dRCA, 3.0x98mm Cypher DES to mRCA, and 3.5x23 mm Cypher DES pRCA. b.) 07/2020 MV: EF>65%, no ischemia/infarct.   Cowden disease (HCC)    Diastolic dysfunction    a.) 07/2020 Echo: EF 60-65%, no rwma, G1DD, nl RV fxn. Mildly dil LA. Triv MR.   Esophagitis, erosive    Fibromyalgia    Gastritis    GERD (gastroesophageal reflux disease)    Hemorrhoids    History of kidney stones    Hyperlipidemia LDL goal <70    Hypertension    IBS (irritable bowel syndrome)    Lichen planus    Long term (current) use of antithrombotics/antiplatelets    a.) DAPT (ASA + clopidogrel )   Low serum vitamin D    Morbid obesity (HCC)    OSA (obstructive sleep apnea)    a.) waiting on CPAP machine as of 01/22/2021   Osteoporosis    Panic disorder    Tubular adenoma    Past Surgical History:  Procedure Laterality Date   ABDOMINAL HYSTERECTOMY     BIOPSY  02/16/2023   Procedure: BIOPSY;  Surgeon: Aundria, Ladell POUR, MD;  [facility] ENDOSCOPY;  Service: Gastroenterology;;   BREAST BIOPSY Right 03/15/2017   PSEUDO-ANGIOMATOUS STROMAL HYPERPLASIA.    CHOLECYSTECTOMY     COLONOSCOPY     CORONARY ANGIOPLASTY WITH STENT PLACEMENT Left 01/21/2006   Procedure: CORONARY ANGIOPLASTY WITH STENT PLACEMENT (Cypher DES x 3 placed; 3.0 x 23 dRCA, 3.0 x 28 mRCA,  3.5 x 23 mm pRCA); Location: [facility]; Surgeon: Marsa Dooms, MD   DILATION AND CURETTAGE OF UTERUS     ESOPHAGOGASTRODUODENOSCOPY     ESOPHAGOGASTRODUODENOSCOPY (EGD) WITH PROPOFOL  N/A 02/16/2023   Procedure: ESOPHAGOGASTRODUODENOSCOPY (EGD) WITH PROPOFOL ;  Surgeon: Toledo, Ladell POUR, MD;  Location: [facility] ENDOSCOPY;  Service: Gastroenterology;  Laterality: N/A;   FINGER ARTHRODESIS Left 03/26/2016   Procedure: ARTHRODESIS FINGER;  Surgeon: Ozell Flake, MD;  Location: [facility] ORS;  Service: Orthopedics;  Laterality: Left;   JOINT REPLACEMENT     SKIN CANCER EXCISION      TOTAL KNEE ARTHROPLASTY Left 02/04/2021   Procedure: TOTAL KNEE ARTHROPLASTY;  Surgeon: Flake Ozell, MD;  Location: [facility] ORS;  Service: Orthopedics;  Laterality: Left;   TUBAL LIGATION      Allergies  Allergies  Allergen Reactions   Molds & Smuts    Other Other (See Comments)    Trees  Cut grass causes eyes water/itch& throat discomfort    Home Medications    Prior to Admission medications   Medication Sig Start Date End Date Taking? Authorizing Provider  acetaminophen  (TYLENOL ) 500 MG tablet Take 500 mg by mouth every 6 (six) hours as needed for mild pain (pain score 1-3).    [provider]  aspirin  EC 81 MG tablet Take 81 mg by mouth daily. Swallow whole.    [provider]  clopidogrel  (PLAVIX ) 75 MG tablet Take 75 mg by mouth daily.  06/27/14   [provider]  Coenzyme Q10 (CO Q-10 PO) Take by mouth daily at 2 am.    [provider]  ezetimibe  (ZETIA ) 10 MG tablet Take 1 tablet (10 mg total) by mouth daily. 07/07/21   Darron Deatrice LABOR, MD  famotidine  (PEPCID ) 20 MG tablet Take 40 mg by mouth at bedtime. 05/04/20   [provider]  Fexofenadine HCl (ALLEGRA ALLERGY PO) Take by mouth. daily    [provider]  Fluticasone  Propionate (FLONASE  ALLERGY RELIEF NA) Place into the nose. 2 sprays each nostril daily    [provider]  losartan  (COZAAR ) 25 MG tablet TAKE 1 TABLET (25 MG TOTAL) BY MOUTH DAILY. 06/12/21 11/19/23  Darron Deatrice LABOR, MD  Menthol , Topical Analgesic, (BIOFREEZE EX) Apply 1 application topically daily as needed (joint pain).    [provider]  metoprolol  (LOPRESSOR ) 50 MG tablet Take 50 mg by mouth 2 (two) times daily.  06/27/14   [provider]  pantoprazole  (PROTONIX ) 40 MG tablet Take 40 mg by mouth daily. 08/30/14   [provider]  rosuvastatin  (CRESTOR ) 10 MG tablet Take 2 tablets (20 mg total) by mouth every evening. 12/16/21   Darron Deatrice LABOR, MD    Physical Exam     Vital Signs:  Judy Prince does not have vital signs available for review today.  Given telephonic nature of communication, physical exam is limited. AAOx3. NAD. Normal affect.  Speech and respirations are unlabored.  Accessory Clinical Findings    None  Assessment & Plan    1.  Preoperative Cardiovascular Risk Assessment: left foot bunion   Date of Surgery:  Clearance TBD                                  Surgeon:  Eva Gay Surgeon's Group or Practice Name:  [facility] Phone number:  [phone] Fax number:  [phone]      Primary Cardiologist: Deatrice Darron, MD  Chart  reviewed as part of pre-operative protocol coverage. Given past medical history and time since last visit, based on ACC/AHA guidelines, Judy Prince would be at acceptable risk for the planned procedure without further cardiovascular testing.   Her RCRI is low risk, 0.9% risk of major cardiac event.  She is able to complete greater than 4 METS of physical activity.  Patient was advised that if she develops new symptoms prior to surgery to contact our office to arrange a follow-up appointment.  She verbalized understanding.    I will route this recommendation to the requesting party via Epic fax function and remove from pre-op pool.       Time:   Today, I have spent *** minutes with the patient with telehealth technology discussing medical history, symptoms, and management plan.  I spent 10 minutes reviewing patient's past cardiac history and cardiac medications.    Judy CHRISTELLA Beauvais, NP  12/06/2023, 7:48 AM

## 2023-12-16 NOTE — Telephone Encounter (Signed)
 Pt will no longer be getting procedure done, FYI

## 2023-12-16 NOTE — Telephone Encounter (Signed)
 Spoke with Allie from [facility], confirmed that patient is no longer having the procedure.

## 2024-01-10 ENCOUNTER — Telehealth

## 2024-01-14 ENCOUNTER — Inpatient Hospital Stay: Admission: RE | Admit: 2024-01-14 | Source: Ambulatory Visit

## 2024-01-21 ENCOUNTER — Ambulatory Visit: Admit: 2024-01-21 | Admitting: Podiatry

## 2024-01-21 SURGERY — BUNIONECTOMY, LAPIDUS
Anesthesia: Choice | Laterality: Left

## 2024-03-11 ENCOUNTER — Other Ambulatory Visit: Payer: Self-pay

## 2024-03-11 ENCOUNTER — Inpatient Hospital Stay
Admission: EM | Admit: 2024-03-11 | Discharge: 2024-03-15 | DRG: 871 | Disposition: A | Attending: Internal Medicine | Admitting: Internal Medicine

## 2024-03-11 ENCOUNTER — Emergency Department

## 2024-03-11 ENCOUNTER — Encounter: Payer: Self-pay | Admitting: Emergency Medicine

## 2024-03-11 DIAGNOSIS — G4733 Obstructive sleep apnea (adult) (pediatric): Secondary | ICD-10-CM | POA: Diagnosis present

## 2024-03-11 DIAGNOSIS — Z7982 Long term (current) use of aspirin: Secondary | ICD-10-CM

## 2024-03-11 DIAGNOSIS — M810 Age-related osteoporosis without current pathological fracture: Secondary | ICD-10-CM | POA: Diagnosis present

## 2024-03-11 DIAGNOSIS — Z79899 Other long term (current) drug therapy: Secondary | ICD-10-CM

## 2024-03-11 DIAGNOSIS — Z8249 Family history of ischemic heart disease and other diseases of the circulatory system: Secondary | ICD-10-CM

## 2024-03-11 DIAGNOSIS — E785 Hyperlipidemia, unspecified: Secondary | ICD-10-CM | POA: Diagnosis present

## 2024-03-11 DIAGNOSIS — I2510 Atherosclerotic heart disease of native coronary artery without angina pectoris: Secondary | ICD-10-CM | POA: Diagnosis present

## 2024-03-11 DIAGNOSIS — Z85828 Personal history of other malignant neoplasm of skin: Secondary | ICD-10-CM

## 2024-03-11 DIAGNOSIS — Z955 Presence of coronary angioplasty implant and graft: Secondary | ICD-10-CM

## 2024-03-11 DIAGNOSIS — N3000 Acute cystitis without hematuria: Secondary | ICD-10-CM | POA: Diagnosis present

## 2024-03-11 DIAGNOSIS — I10 Essential (primary) hypertension: Secondary | ICD-10-CM | POA: Diagnosis present

## 2024-03-11 DIAGNOSIS — Z96652 Presence of left artificial knee joint: Secondary | ICD-10-CM | POA: Diagnosis present

## 2024-03-11 DIAGNOSIS — Z981 Arthrodesis status: Secondary | ICD-10-CM

## 2024-03-11 DIAGNOSIS — Z87442 Personal history of urinary calculi: Secondary | ICD-10-CM

## 2024-03-11 DIAGNOSIS — Z9851 Tubal ligation status: Secondary | ICD-10-CM

## 2024-03-11 DIAGNOSIS — M797 Fibromyalgia: Secondary | ICD-10-CM | POA: Diagnosis present

## 2024-03-11 DIAGNOSIS — I330 Acute and subacute infective endocarditis: Secondary | ICD-10-CM | POA: Diagnosis present

## 2024-03-11 DIAGNOSIS — Z9049 Acquired absence of other specified parts of digestive tract: Secondary | ICD-10-CM

## 2024-03-11 DIAGNOSIS — A419 Sepsis, unspecified organism: Principal | ICD-10-CM | POA: Diagnosis present

## 2024-03-11 DIAGNOSIS — K219 Gastro-esophageal reflux disease without esophagitis: Secondary | ICD-10-CM | POA: Diagnosis present

## 2024-03-11 DIAGNOSIS — Z6833 Body mass index (BMI) 33.0-33.9, adult: Secondary | ICD-10-CM

## 2024-03-11 DIAGNOSIS — A408 Other streptococcal sepsis: Principal | ICD-10-CM | POA: Diagnosis present

## 2024-03-11 DIAGNOSIS — K589 Irritable bowel syndrome without diarrhea: Secondary | ICD-10-CM | POA: Diagnosis present

## 2024-03-11 DIAGNOSIS — Z7902 Long term (current) use of antithrombotics/antiplatelets: Secondary | ICD-10-CM

## 2024-03-11 DIAGNOSIS — E66811 Obesity, class 1: Secondary | ICD-10-CM | POA: Diagnosis present

## 2024-03-11 DIAGNOSIS — Z833 Family history of diabetes mellitus: Secondary | ICD-10-CM

## 2024-03-11 LAB — CBC
HCT: 45.2 % (ref 36.0–46.0)
HCT: 41.1 % (ref 36.0–46.0)
Hemoglobin: 14.9 g/dL (ref 12.0–15.0)
Hemoglobin: 13.9 g/dL (ref 12.0–15.0)
MCH: 29.9 pg (ref 26.0–34.0)
MCH: 30.5 pg (ref 26.0–34.0)
MCHC: 33 g/dL (ref 30.0–36.0)
MCHC: 33.8 g/dL (ref 30.0–36.0)
MCV: 90.8 fL (ref 80.0–100.0)
MCV: 90.1 fL (ref 80.0–100.0)
Platelets: 188 K/uL (ref 150–400)
Platelets: 160 K/uL (ref 150–400)
RBC: 4.98 MIL/uL (ref 3.87–5.11)
RBC: 4.56 MIL/uL (ref 3.87–5.11)
RDW: 12.3 % (ref 11.5–15.5)
RDW: 12.3 % (ref 11.5–15.5)
WBC: 5.8 K/uL (ref 4.0–10.5)
WBC: 5.7 K/uL (ref 4.0–10.5)
nRBC: 0 % (ref 0.0–0.2)
nRBC: 0 % (ref 0.0–0.2)

## 2024-03-11 LAB — BASIC METABOLIC PANEL WITH GFR
Anion gap: 10 (ref 5–15)
BUN: 13 mg/dL (ref 8–23)
CO2: 24 mmol/L (ref 22–32)
Calcium: 8.9 mg/dL (ref 8.9–10.3)
Chloride: 99 mmol/L (ref 98–111)
Creatinine, Ser: 0.67 mg/dL (ref 0.44–1.00)
GFR, Estimated: >60 mL/min (ref >60)
Glucose, Bld: 110 mg/dL — ABNORMAL HIGH (ref 70–99)
Potassium: 3.9 mmol/L (ref 3.5–5.1)
Sodium: 133 mmol/L — ABNORMAL LOW (ref 135–145)

## 2024-03-11 LAB — URINALYSIS, ROUTINE W REFLEX MICROSCOPIC
Bacteria, UA: NONE SEEN
Bilirubin Urine: NEGATIVE
Glucose, UA: NEGATIVE mg/dL
Ketones, ur: NEGATIVE mg/dL
Leukocytes,Ua: NEGATIVE
Nitrite: NEGATIVE
Protein, ur: NEGATIVE mg/dL
Specific Gravity, Urine: 1.021 (ref 1.005–1.030)
pH: 5 (ref 5.0–8.0)

## 2024-03-11 LAB — BLOOD CULTURE ID PANEL (REFLEXED) - BCID2

## 2024-03-11 LAB — COMPREHENSIVE METABOLIC PANEL WITH GFR
ALT: 23 U/L (ref 0–44)
AST: 21 U/L (ref 15–41)
Albumin: 4.2 g/dL (ref 3.5–5.0)
Alkaline Phosphatase: 77 U/L (ref 38–126)
Anion gap: 11 (ref 5–15)
BUN: 17 mg/dL (ref 8–23)
CO2: 25 mmol/L (ref 22–32)
Calcium: 9.7 mg/dL (ref 8.9–10.3)
Chloride: 101 mmol/L (ref 98–111)
Creatinine, Ser: 0.8 mg/dL (ref 0.44–1.00)
GFR, Estimated: >60 mL/min (ref >60)
Glucose, Bld: 96 mg/dL (ref 70–99)
Potassium: 4.6 mmol/L (ref 3.5–5.1)
Sodium: 136 mmol/L (ref 135–145)
Total Bilirubin: 0.4 mg/dL (ref 0.0–1.2)
Total Protein: 7.6 g/dL (ref 6.5–8.1)

## 2024-03-11 LAB — PROTIME-INR
INR: 1.1 (ref 0.8–1.2)
Prothrombin Time: 14.8 s (ref 11.4–15.2)

## 2024-03-11 LAB — CORTISOL-AM, BLOOD: Cortisol - AM: 10.3 ug/dL (ref 6.7–22.6)

## 2024-03-11 LAB — HIV ANTIBODY (ROUTINE TESTING W REFLEX): HIV Screen 4th Generation wRfx: NONREACTIVE

## 2024-03-11 LAB — LIPASE, BLOOD: Lipase: 40 U/L (ref 11–51)

## 2024-03-11 LAB — LACTIC ACID, PLASMA: Lactic Acid, Venous: 3 mmol/L (ref 0.5–1.9)

## 2024-03-11 MED ORDER — MAGNESIUM HYDROXIDE 400 MG/5ML PO SUSP
30.0000 mL | Freq: Every day | ORAL | Status: DC | PRN
  Administered 2024-03-13: 17:00:00 30 mL via ORAL
  Filled 2024-03-11: qty 30

## 2024-03-11 MED ORDER — ACETAMINOPHEN 500 MG PO TABS: 1000.0000 mg | ORAL_TABLET | Freq: Once | ORAL | Status: DC

## 2024-03-11 MED ORDER — PHENAZOPYRIDINE HCL 100 MG PO TABS
100.0000 mg | ORAL_TABLET | Freq: Three times a day (TID) | ORAL | Status: DC
  Administered 2024-03-11 – 2024-03-13 (×8): 100 mg via ORAL
  Filled 2024-03-11 (×10): qty 1

## 2024-03-11 MED ORDER — LACTATED RINGERS IV SOLN
150.0000 mL/h | INTRAVENOUS | Status: AC
  Administered 2024-03-11: 150 mL/h via INTRAVENOUS

## 2024-03-11 MED ORDER — FLUTICASONE PROPIONATE 50 MCG/ACT NA SUSP
2.0000 | Freq: Every day | NASAL | Status: DC
  Administered 2024-03-12 – 2024-03-15 (×4): 2 via NASAL
  Filled 2024-03-11: qty 16

## 2024-03-11 MED ORDER — FAMOTIDINE 20 MG PO TABS
40.0000 mg | ORAL_TABLET | Freq: Every day | ORAL | Status: DC
  Administered 2024-03-11 – 2024-03-14 (×4): 40 mg via ORAL
  Filled 2024-03-11 (×4): qty 2

## 2024-03-11 MED ORDER — MORPHINE SULFATE (PF) 4 MG/ML IV SOLN
4.0000 mg | Freq: Once | INTRAVENOUS | Status: AC
  Administered 2024-03-11: 4 mg via INTRAVENOUS
  Filled 2024-03-11: qty 1

## 2024-03-11 MED ORDER — METOCLOPRAMIDE HCL 5 MG/ML IJ SOLN
10.0000 mg | Freq: Once | INTRAMUSCULAR | Status: AC
  Administered 2024-03-11: 10 mg via INTRAVENOUS
  Filled 2024-03-11: qty 2

## 2024-03-11 MED ORDER — TRAZODONE HCL 50 MG PO TABS
25.0000 mg | ORAL_TABLET | Freq: Every evening | ORAL | Status: DC | PRN
  Administered 2024-03-13: 25 mg via ORAL
  Filled 2024-03-11: qty 1

## 2024-03-11 MED ORDER — FEXOFENADINE HCL 60 MG PO TABS: 60.0000 mg | ORAL_TABLET | Freq: Every day | ORAL | Status: DC | PRN

## 2024-03-11 MED ORDER — LOSARTAN POTASSIUM 50 MG PO TABS
25.0000 mg | ORAL_TABLET | Freq: Every day | ORAL | Status: DC
  Administered 2024-03-11 – 2024-03-15 (×5): 25 mg via ORAL
  Filled 2024-03-11 (×5): qty 1

## 2024-03-11 MED ORDER — ACETAMINOPHEN 325 MG PO TABS
650.0000 mg | ORAL_TABLET | Freq: Four times a day (QID) | ORAL | Status: DC | PRN
  Administered 2024-03-12 – 2024-03-14 (×4): 650 mg via ORAL
  Filled 2024-03-11 (×5): qty 2

## 2024-03-11 MED ORDER — PANTOPRAZOLE SODIUM 40 MG PO TBEC: 40.0000 mg | DELAYED_RELEASE_TABLET | Freq: Every day | ORAL | Status: DC

## 2024-03-11 MED ORDER — ROSUVASTATIN CALCIUM 20 MG PO TABS
20.0000 mg | ORAL_TABLET | Freq: Every evening | ORAL | Status: DC
  Administered 2024-03-11 – 2024-03-14 (×4): 20 mg via ORAL
  Filled 2024-03-11 (×4): qty 1

## 2024-03-11 MED ORDER — ACETAMINOPHEN 650 MG RE SUPP: 650.0000 mg | Freq: Four times a day (QID) | RECTAL | Status: DC | PRN

## 2024-03-11 MED ORDER — METOPROLOL TARTRATE 50 MG PO TABS
50.0000 mg | ORAL_TABLET | Freq: Two times a day (BID) | ORAL | Status: DC
  Administered 2024-03-11 – 2024-03-15 (×9): 50 mg via ORAL
  Filled 2024-03-11 (×9): qty 1

## 2024-03-11 MED ORDER — CO Q-10 50 MG PO CAPS: ORAL_CAPSULE | Freq: Every day | ORAL | Status: DC

## 2024-03-11 MED ORDER — LACTATED RINGERS IV BOLUS
1000.0000 mL | Freq: Once | INTRAVENOUS | Status: AC
  Administered 2024-03-11: 1000 mL via INTRAVENOUS

## 2024-03-11 MED ORDER — ASPIRIN 81 MG PO TBEC
81.0000 mg | DELAYED_RELEASE_TABLET | Freq: Every day | ORAL | Status: DC
  Administered 2024-03-11 – 2024-03-15 (×5): 81 mg via ORAL
  Filled 2024-03-11 (×5): qty 1

## 2024-03-11 MED ORDER — ONDANSETRON HCL 4 MG PO TABS: 4.0000 mg | ORAL_TABLET | Freq: Four times a day (QID) | ORAL | Status: DC | PRN

## 2024-03-11 MED ORDER — IOHEXOL 300 MG/ML  SOLN
100.0000 mL | Freq: Once | INTRAMUSCULAR | Status: AC | PRN
  Administered 2024-03-11: 100 mL via INTRAVENOUS

## 2024-03-11 MED ORDER — ACETAMINOPHEN 10 MG/ML IV SOLN
1000.0000 mg | Freq: Once | INTRAVENOUS | Status: AC
  Administered 2024-03-11: 1000 mg via INTRAVENOUS
  Filled 2024-03-11: qty 100

## 2024-03-11 MED ORDER — ONDANSETRON HCL 4 MG/2ML IJ SOLN: 4.0000 mg | Freq: Four times a day (QID) | INTRAMUSCULAR | Status: DC | PRN

## 2024-03-11 MED ORDER — SODIUM CHLORIDE 0.9 % IV SOLN
2.0000 g | INTRAVENOUS | Status: DC
  Administered 2024-03-11 – 2024-03-15 (×5): 2 g via INTRAVENOUS
  Filled 2024-03-11 (×6): qty 20

## 2024-03-11 MED ORDER — ONDANSETRON HCL 4 MG/2ML IJ SOLN
4.0000 mg | Freq: Once | INTRAMUSCULAR | Status: AC
  Administered 2024-03-11: 4 mg via INTRAVENOUS
  Filled 2024-03-11: qty 2

## 2024-03-11 MED ORDER — ENOXAPARIN SODIUM 40 MG/0.4ML IJ SOSY
40.0000 mg | PREFILLED_SYRINGE | INTRAMUSCULAR | Status: DC
  Administered 2024-03-11 – 2024-03-15 (×5): 40 mg via SUBCUTANEOUS
  Filled 2024-03-11 (×5): qty 0.4

## 2024-03-11 MED ORDER — CLOPIDOGREL BISULFATE 75 MG PO TABS
75.0000 mg | ORAL_TABLET | Freq: Every day | ORAL | Status: DC
  Administered 2024-03-11 – 2024-03-15 (×5): 75 mg via ORAL
  Filled 2024-03-11 (×5): qty 1

## 2024-03-11 MED ORDER — SODIUM CHLORIDE 0.9 % IV SOLN
2.0000 g | Freq: Once | INTRAVENOUS | Status: DC
  Filled 2024-03-11: qty 20

## 2024-03-11 MED ORDER — FAMOTIDINE IN NACL 20-0.9 MG/50ML-% IV SOLN
20.0000 mg | Freq: Once | INTRAVENOUS | Status: AC
  Administered 2024-03-11: 20 mg via INTRAVENOUS
  Filled 2024-03-11: qty 50

## 2024-03-11 MED ORDER — EZETIMIBE 10 MG PO TABS
10.0000 mg | ORAL_TABLET | Freq: Every day | ORAL | Status: DC
  Administered 2024-03-11 – 2024-03-15 (×5): 10 mg via ORAL
  Filled 2024-03-11 (×5): qty 1

## 2024-03-11 NOTE — Assessment & Plan Note (Signed)
Famotidine 40 mg nightly

## 2024-03-11 NOTE — Assessment & Plan Note (Addendum)
 Streptococcus bacteremia, grew Streptococcus mitis likely dental as patient had dental work recently Specificity/sensitivities pending Continue ceftriaxone  Dr. Epifanio with infectious disease has been consulted 12/16: 2 sets of culture has been repeated on 12/14, no growth to date.  Original culture that grew Streptococcus species has been reintubated for better growth, this was updated on 12/16 at 11:53.

## 2024-03-11 NOTE — Progress Notes (Signed)
°  Progress Note   Patient: Judy Prince FMW:[id number] DOB: Apr 04, 1955 DOA: 03/11/2024     0 DOS: the patient was seen and examined on 03/11/2024   Brief hospital course: 68 year old female with history of CAD, IBS, gastritis, fibromyalgia, history of stone came into ED complaining of abdominal pain and dysuria.  She went to primary care's office with some urinary frequency and thought to have a urinary tract infection, called PCP office and they prescribed her Macrobid for 2 days.  Patient is stated that she was compliant with the medications.  Even after 36 hours of treatment, patient did not have improvement in her urinary symptoms and decided to come to the emergency room for evaluation. She was admitted from the emergency room with UTI and sepsis.  She had temperature of 100.2.  Lactic acid was 3.  Assessment and Plan:  Sepsis due to undetermined organism (HCC) -Patient's symptoms improved with antibiotics, ceftriaxone , IV fluid and Pyridium  - Will continue to monitor patient's symptoms and follow-up cultures - Continue ceftriaxone   GERD without esophagitis - Continue PPI therapy as well as H2 blocker therapy.  Coronary artery disease - Continue aspirin , Plavix , beta-blocker therapy with Toprol  and statin therapy as well as Cozaar .  Dyslipidemia -Continue statin therapy and Zetia .       Subjective: Feels better, dysuria and frequency has improved  Physical Exam: Vitals:   03/11/24 0456 03/11/24 0556 03/11/24 0736 03/11/24 1159  BP:  (!) 140/71 133/60 (!) 148/68  Pulse:  81 80 77  Resp:  18 18 18   Temp:  100.2 F (37.9 C) 99.1 F (37.3 C) 98.9 F (37.2 C)  TempSrc:  Oral Oral Oral  SpO2: 95% 95% 95% 96%  Weight:  83.2 kg    Height:  5' 2 (1.575 m)     Constitutional: Alert, awake, calm, comfortable HEENT: Neck supple Respiratory: Clear to auscultation B/L, no wheezing, no rales.  Cardiovascular: Regular rate and rhythm, no murmurs / rubs / gallops. No extremity  edema. 2+ pedal pulses. No carotid bruits.  Abdomen: Soft, no tenderness, Bowel sounds positive.  Musculoskeletal: no clubbing / cyanosis. Good ROM, no contractures. Normal muscle tone.  Skin: no rashes, lesions, ulcers. Neurologic: CN 2-12 grossly intact. Sensation intact, No focal deficit identified Psychiatric: Alert and oriented x 3. Normal mood.    Data Reviewed:  Cultures pending  Family Communication: None  Disposition: Status is: Observation The patient remains OBS appropriate and will d/c before 2 midnights.  Planned Discharge Destination: Home    Time spent: 35 minutes  Author: Nena Rebel, MD 03/11/2024 2:12 PM  For on call review [email or web address].

## 2024-03-11 NOTE — Consult Note (Addendum)
 PHARMACY - PHYSICIAN COMMUNICATION CRITICAL VALUE ALERT - BLOOD CULTURE IDENTIFICATION (BCID)  Judy Prince is an 68 y.o. female who presented to [facility] on 03/11/2024 with a chief complaint of sepsis unknown origin  Assessment:  BCID = 1/4 bottles strep species, no resistance detected  Name of physician (or Provider) Contacted: Dr. Nena Rebel  Current antibiotics: Rocephin    Changes to prescribed antibiotics recommended:  Patient is on recommended antibiotics - No changes needed  Results for orders placed or performed during the hospital encounter of 03/11/24  Blood Culture ID Panel (Reflexed) (Collected: 03/11/2024  4:25 AM)  Result Value Ref Range   Enterococcus faecalis NOT DETECTED NOT DETECTED   Enterococcus Faecium NOT DETECTED NOT DETECTED   Listeria monocytogenes NOT DETECTED NOT DETECTED   Staphylococcus species NOT DETECTED NOT DETECTED   Staphylococcus aureus (BCID) NOT DETECTED NOT DETECTED   Staphylococcus epidermidis NOT DETECTED NOT DETECTED   Staphylococcus lugdunensis NOT DETECTED NOT DETECTED   Streptococcus species DETECTED (A) NOT DETECTED   Streptococcus agalactiae NOT DETECTED NOT DETECTED   Streptococcus pneumoniae NOT DETECTED NOT DETECTED   Streptococcus pyogenes NOT DETECTED NOT DETECTED   A.calcoaceticus-baumannii NOT DETECTED NOT DETECTED   Bacteroides fragilis NOT DETECTED NOT DETECTED   Enterobacterales NOT DETECTED NOT DETECTED   Enterobacter cloacae complex NOT DETECTED NOT DETECTED   Escherichia coli NOT DETECTED NOT DETECTED   Klebsiella aerogenes NOT DETECTED NOT DETECTED   Klebsiella oxytoca NOT DETECTED NOT DETECTED   Klebsiella pneumoniae NOT DETECTED NOT DETECTED   Proteus species NOT DETECTED NOT DETECTED   Salmonella species NOT DETECTED NOT DETECTED   Serratia marcescens NOT DETECTED NOT DETECTED   Haemophilus influenzae NOT DETECTED NOT DETECTED   Neisseria meningitidis NOT DETECTED NOT DETECTED   Pseudomonas aeruginosa NOT  DETECTED NOT DETECTED   Stenotrophomonas maltophilia NOT DETECTED NOT DETECTED   Candida albicans NOT DETECTED NOT DETECTED   Candida auris NOT DETECTED NOT DETECTED   Candida glabrata NOT DETECTED NOT DETECTED   Candida krusei NOT DETECTED NOT DETECTED   Candida parapsilosis NOT DETECTED NOT DETECTED   Candida tropicalis NOT DETECTED NOT DETECTED   Cryptococcus neoformans/gattii NOT DETECTED NOT DETECTED    Annabella LOISE Banks 03/11/2024  6:38 PM

## 2024-03-11 NOTE — ED Provider Notes (Signed)
 [facility] Provider Note    Event Date/Time   First MD Initiated Contact with Patient 03/11/24 [phone]     (approximate)   History   Abdominal Pain and Dysuria   HPI  Judy Prince is a 68 y.o. female who presents to the ED for evaluation of Abdominal Pain and Dysuria   Review a PCP visit from October.  History of CAD, IBS and gastritis, fibromyalgia, stones.  Patient presents to the ED for evaluation of abdominal cramping, nausea worsening over the past 1 week.  She reports some urinary frequency and thought she had a UTI, called her PCPs office and an on-call provider prescribed Macrobid 2 days ago.  She reports compliance with this medication for the past 36 hours without any improvement of her symptoms.   Physical Exam   Triage Vital Signs: ED Triage Vitals  Encounter Vitals Group     BP 03/11/24 0102 (!) 169/65     Girls Systolic BP Percentile --      Girls Diastolic BP Percentile --      Boys Systolic BP Percentile --      Boys Diastolic BP Percentile --      Pulse Rate 03/11/24 0102 70     Resp 03/11/24 0102 18     Temp 03/11/24 0102 97.9 F (36.6 C)     Temp Source 03/11/24 0102 Oral     SpO2 03/11/24 0102 99 %     Weight --      Height --      Head Circumference --      Peak Flow --      Pain Score 03/11/24 0103 7     Pain Loc --      Pain Education --      Exclude from Growth Chart --     Most recent vital signs: Vitals:   03/11/24 0456 03/11/24 0556  BP:  (!) 140/71  Pulse:  81  Resp:  18  Temp:  100.2 F (37.9 C)  SpO2: 95% 95%    General: Awake, no distress.  CV:  Good peripheral perfusion.  Resp:  Normal effort.  Abd:  No distention.  Mild LUQ tenderness without guarding or peritoneal features.  Otherwise soft and nontender MSK:  No deformity noted.  Neuro:  No focal deficits appreciated. Other:     ED Results / Procedures / Treatments   Labs (all labs ordered are listed, but only abnormal results are  displayed) Labs Reviewed  URINALYSIS, ROUTINE W REFLEX MICROSCOPIC - Abnormal; Notable for the following components:      Result Value   Color, Urine YELLOW (*)    APPearance CLEAR (*)    Hgb urine dipstick SMALL (*)    All other components within normal limits  LACTIC ACID, PLASMA - Abnormal; Notable for the following components:   Lactic Acid, Venous 3.0 (*)    All other components within normal limits  CULTURE, BLOOD (ROUTINE X 2)  CULTURE, BLOOD (ROUTINE X 2)  URINE CULTURE  LIPASE, BLOOD  COMPREHENSIVE METABOLIC PANEL WITH GFR  CBC  HIV ANTIBODY (ROUTINE TESTING W REFLEX)  PROTIME-INR  CORTISOL-AM, BLOOD  BASIC METABOLIC PANEL WITH GFR  CBC    EKG   RADIOLOGY CT abdomen/pelvis interpreted by me with bladder wall thickening  Official radiology report(s): CT ABDOMEN PELVIS W CONTRAST Result Date: 03/11/2024 EXAM: CT ABDOMEN AND PELVIS WITH CONTRAST 03/11/2024 03:57:04 AM TECHNIQUE: CT of the abdomen and pelvis was performed with the  administration of 100 mL of iohexol  (OMNIPAQUE ) 300 MG/ML solution. Multiplanar reformatted images are provided for review. Automated exposure control, iterative reconstruction, and/or weight-based adjustment of the mA/kV was utilized to reduce the radiation dose to as low as reasonably achievable. COMPARISON: CT abdomen and pelvis 06/17/2006. CLINICAL HISTORY: 68 year old female with weakness, nausea, lower abdominal pain, frequent urination, started on treatment for urinary tract infection yesterday. FINDINGS: LOWER CHEST: Calcified coronary artery atherosclerosis and/or stents visible (series 2 image 3). Normal heart size. Negative lung bases; mild dependent atelectasis. LIVER: The liver is unremarkable. GALLBLADDER AND BILE DUCTS: Chronic cholecystectomy. No biliary ductal dilatation. SPLEEN: No acute abnormality. PANCREAS: No acute abnormality. ADRENAL GLANDS: No acute abnormality. KIDNEYS, URETERS AND BLADDER: Bilateral renal enhancement and  contrast excretion appears symmetric and normal. There is mild possible inflammatory stranding along the anterior superior urinary bladder wall on series 2 image 82. Otherwise unremarkable bladder, renal collecting systems, and ureters. Incidental pelvic phleboliths. No stones in the kidneys or ureters. No hydronephrosis. No perinephric or periureteral stranding. GI AND BOWEL: Stomach demonstrates no acute abnormality. Normal appendix in the right lower quadrant on series 2 image 65. There is no bowel obstruction. PERITONEUM AND RETROPERITONEUM: No ascites. No free air. VASCULATURE: Calcified aortoiliac atherosclerosis. Major arterial and portal venous structures are patent. Aorta is normal in caliber. LYMPH NODES: No lymphadenopathy. REPRODUCTIVE ORGANS: Surgically absent uterus and diminutive or absent ovaries as in 2008. BONES AND SOFT TISSUES: Osteopenia. Some hyperostosis in the visible lower thoracic spine and occasional interbody ankylosis. Advanced lower lumbar facet arthropathy including some vacuum facet disease. No acute osseous abnormality. No focal soft tissue abnormality. IMPRESSION: 1. Possible mild inflammatory changes along the anterior superior urinary bladder wall (series 2 image 82), consider UTI or Cystitis. 2. No other acute or inflammatory process identified in the abdomen or pelvis. 3. Calcified aortic, coronary artery atherosclerosis. Electronically signed by: Helayne Hurst MD 03/11/2024 04:07 AM EST RP Workstation: [id number]    PROCEDURES and INTERVENTIONS:  .Critical Care  Performed by: Claudene Rover, MD Authorized by: Claudene Rover, MD   Critical care provider statement:    Critical care time (minutes):  30   Critical care time was exclusive of:  Separately billable procedures and treating other patients   Critical care was necessary to treat or prevent imminent or life-threatening deterioration of the following conditions:  Sepsis   Critical care was time spent personally by me  on the following activities:  Development of treatment plan with patient or surrogate, discussions with consultants, evaluation of patient's response to treatment, examination of patient, ordering and review of laboratory studies, ordering and review of radiographic studies, ordering and performing treatments and interventions, pulse oximetry, re-evaluation of patient's condition and review of old charts .1-3 Lead EKG Interpretation  Performed by: Claudene Rover, MD Authorized by: Claudene Rover, MD     Interpretation: normal     ECG rate:  88   ECG rate assessment: normal     Rhythm: sinus rhythm     Ectopy: none     Conduction: normal     Medications  aspirin  EC tablet 81 mg (has no administration in time range)  ezetimibe  (ZETIA ) tablet 10 mg (has no administration in time range)  losartan  (COZAAR ) tablet 25 mg (has no administration in time range)  metoprolol  tartrate (LOPRESSOR ) tablet 50 mg (has no administration in time range)  rosuvastatin  (CRESTOR ) tablet 20 mg (has no administration in time range)  famotidine  (PEPCID ) tablet 40 mg (has no administration in time range)  pantoprazole  (PROTONIX ) EC tablet 40 mg (has no administration in time range)  clopidogrel  (PLAVIX ) tablet 75 mg (has no administration in time range)  fexofenadine  (ALLEGRA ) tablet 60 mg (has no administration in time range)  fluticasone  (FLONASE ) 50 MCG/ACT nasal spray 2 spray (has no administration in time range)  lactated ringers  infusion (150 mL/hr Intravenous New Bag/Given 03/11/24 0603)  enoxaparin  (LOVENOX ) injection 40 mg (has no administration in time range)  cefTRIAXone  (ROCEPHIN ) 2 g in sodium chloride  0.9 % 100 mL IVPB (2 g Intravenous New Bag/Given 03/11/24 0608)  acetaminophen  (TYLENOL ) tablet 650 mg (has no administration in time range)    Or  acetaminophen  (TYLENOL ) suppository 650 mg (has no administration in time range)  traZODone  (DESYREL ) tablet 25 mg (has no administration in time range)   magnesium  hydroxide (MILK OF MAGNESIA) suspension 30 mL (has no administration in time range)  ondansetron  (ZOFRAN ) tablet 4 mg (has no administration in time range)    Or  ondansetron  (ZOFRAN ) injection 4 mg (has no administration in time range)  phenazopyridine  (PYRIDIUM ) tablet 100 mg (100 mg Oral Given 03/11/24 0606)  ondansetron  (ZOFRAN ) injection 4 mg (4 mg Intravenous Given 03/11/24 0334)  morphine  (PF) 4 MG/ML injection 4 mg (4 mg Intravenous Given 03/11/24 0336)  lactated ringers  bolus 1,000 mL (1,000 mLs Intravenous New Bag/Given 03/11/24 0339)  famotidine  (PEPCID ) IVPB 20 mg premix (0 mg Intravenous Stopped 03/11/24 0446)  iohexol  (OMNIPAQUE ) 300 MG/ML solution 100 mL (100 mLs Intravenous Contrast Given 03/11/24 0349)  acetaminophen  (OFIRMEV ) IV 1,000 mg (0 mg Intravenous Stopped 03/11/24 0506)  metoCLOPramide  (REGLAN ) injection 10 mg (10 mg Intravenous Given 03/11/24 0444)     IMPRESSION / MDM / ASSESSMENT AND PLAN / ED COURSE  I reviewed the triage vital signs and the nursing notes.  Differential diagnosis includes, but is not limited to, UTI, pyelonephritis, gastritis, pancreatitis, ureteral colic  {Patient presents with symptoms of an acute illness or injury that is potentially life-threatening.  Patient presents to the ED with urinary symptoms and lower abdominal discomfort ultimately with signs of sepsis from UTI.  Initially some uncertainty as she had normal CBC, metabolic panel and UA without clear signs of cystitis but her only symptoms were clearly urinary in nature.  CT with signs of cystitis without urologic obstruction.   She spiked a fever in the ED and continues to feel quite unwell, multiple episodes of emesis.  We therefore broaden scope and obtain blood cultures, lactic acid and provide antibiotics  Consult medicine for admission  Clinical Course as of 03/11/24 0643  Sat Mar 11, 2024  0320 1 week upset stomach, nausea. Lower abd pressure, urinary  frequency. Macrobid 2 days, not helping [DS]  0439 Reassessed.  Patient spiked a fever and had additional emesis.  We discussed signs of sepsis from cystitis.  Despite her UA being essentially normal with her symptoms and CT findings, spiked fever was concerned about urinary source.  Recommend admission and she is agreeable. [DS]    Clinical Course User Index [DS] Claudene Rover, MD     FINAL CLINICAL IMPRESSION(S) / ED DIAGNOSES   Final diagnoses:  Sepsis due to urinary tract infection (HCC)     Rx / DC Orders   ED Discharge Orders     None        Note:  This document was prepared using Dragon voice recognition software and may include unintentional dictation errors.   Claudene Rover, MD 03/11/24 [phone]

## 2024-03-11 NOTE — Hospital Course (Signed)
 68 year old female with history of CAD, IBS, gastritis, fibromyalgia, history of stone came into ED complaining of abdominal pain and dysuria.  She went to primary care's office with some urinary frequency and thought to have a urinary tract infection, called PCP office and they prescribed her Macrobid for 2 days.  Patient is stated that she was compliant with the medications.  Even after 36 hours of treatment, patient did not have improvement in her urinary symptoms and decided to come to the emergency room for evaluation. She was admitted from the emergency room with UTI and sepsis.  She had temperature of 100.2.  Lactic acid was 3.  12/16: I assumed care of the patient.  Noted that blood cultures had to be reintubated for better growth at approximately 1130 today.  Continue ceftriaxone  2 g IV, day 4 of antibiotics.

## 2024-03-11 NOTE — Assessment & Plan Note (Signed)
 Will continue aspirin  81 mg, Plavix  75 mg, metoprolol  tartrate 50 mg twice daily and rosuvastatin  20 mg every evening, losartan  25 mg daily

## 2024-03-11 NOTE — ED Triage Notes (Signed)
 Pt arrives via POV from home, ambulatory to triage, gait steady, no acute distress noted c/o chills, weakness, nausea, lower abd pain, and frequent urination. Pt was called in Macrobid yesterday. Pt fears she may be dehydrated from urinating so much.

## 2024-03-11 NOTE — Progress Notes (Signed)
 Mobility Specialist - Progress Note   Pre-mobility: HR, BP, SpO2 During mobility: HR, BP, SpO2 Post-mobility: HR, BP, SPO2     03/11/24 1000  Mobility  Activity Ambulated with assistance;Stood at bedside  Level of Assistance Standby assist, set-up cues, supervision of patient - no hands on  Assistive Device Front wheel walker  Distance Ambulated (ft) 160 ft  Range of Motion/Exercises Active  Activity Response Tolerated well  Mobility Referral Yes  Mobility visit 1 Mobility  Mobility Specialist Start Time (ACUTE ONLY) 0945   Pt resting in bed on RA upon entry. Pt STS and ambulates to hallway around NS SBA with RW. Pt returned to room and standing at sink to perform hygiene tasks. Pt returned to bed and left with need in reach.   Guido Rumble Mobility Specialist 03/11/2024, 10:51 AM

## 2024-03-11 NOTE — Assessment & Plan Note (Signed)
 Continue ezetimibe  10 mg daily, rosuvastatin  20 mg every evening

## 2024-03-11 NOTE — H&P (Signed)
 [place]   PATIENT NAME: Judy Prince    MR#:  [id number]  DATE OF BIRTH:  1956-01-26  DATE OF ADMISSION:  03/11/2024  PRIMARY CARE PHYSICIAN: Cleotilde Oneil FALCON, MD   Patient is coming from: [facility]  REQUESTING/REFERRING PHYSICIAN: Claudene Rover, MD  CHIEF COMPLAINT:   Chief Complaint  Patient presents with   Abdominal Pain   Dysuria    HISTORY OF PRESENT ILLNESS:  PAULETTA PICKNEY is a 68 y.o. Caucasian female with medical history significant for anxiety, osteoarthritis, coronary artery disease, status post PCI and 3 stents, diastolic dysfunction, fibromyalgia, GERD, hypertension, dyslipidemia, IBS and lichen planus as well as OSA, who presented to the ER with acute onset of persistent dysuria, urinary frequency with associated lower abdominal pain, chills, generalized weakness and nausea with vomiting twice.  She was prescribed Macrobid yesterday for suspected UTI.  She was concerned about being dehydrated from polyuria.  She denies any chest pain or palpitations.  No cough or wheezing or dyspnea.  No bleeding diathesis.  ED Course: When she came to the ER, BP was 169/65 with otherwise normal vital signs.  Labs revealed unremarkable CMP and CBC.  UA was negative.  Urine culture was sent as well as 2 blood cultures. EKG as reviewed by me : None Imaging: Abdominal and pelvic CT scan revealed the following: 1. Possible mild inflammatory changes along the anterior superior urinary bladder wall (series 2 image 82), consider UTI or Cystitis. 2. No other acute or inflammatory process identified in the abdomen or pelvis. 3. Calcified aortic, coronary artery atherosclerosis.  The patient was given 10 mg of IV Reglan , 1 L bolus of IV lactated Ringer , 4 mg of IV morphine  sulfate and 4 mg of IV Zofran , 2 g of IV Rocephin  and 1 g of IV Tylenol .  She will be admitted to a telemetry observation bed for further evaluation and management.  PAST MEDICAL HISTORY:   Past Medical History:   Diagnosis Date   Anxiety    Arthritis    B12 deficiency    Basal cell carcinoma (BCC) of forearm    Coronary artery disease    a.) LHC 01/21/2006: 100 % pRCA, 100% mRCA, 100% dRCA. PCI peformed; 80% mRCA - PCI complicated by spiral dissection --> 3.0x 23mm Cypher DES to dRCA, 3.0x20mm Cypher DES to mRCA, and 3.5x23 mm Cypher DES pRCA. b.) 07/2020 MV: EF>65%, no ischemia/infarct.   Cowden disease (HCC)    Diastolic dysfunction    a.) 07/2020 Echo: EF 60-65%, no rwma, G1DD, nl RV fxn. Mildly dil LA. Triv MR.   Esophagitis, erosive    Fibromyalgia    Gastritis    GERD (gastroesophageal reflux disease)    Hemorrhoids    History of kidney stones    Hyperlipidemia LDL goal <70    Hypertension    IBS (irritable bowel syndrome)    Lichen planus    Long term (current) use of antithrombotics/antiplatelets    a.) DAPT (ASA + clopidogrel )   Low serum vitamin D    Morbid obesity (HCC)    OSA (obstructive sleep apnea)    a.) waiting on CPAP machine as of 01/22/2021   Osteoporosis    Panic disorder    Tubular adenoma     PAST SURGICAL HISTORY:   Past Surgical History:  Procedure Laterality Date   ABDOMINAL HYSTERECTOMY     BIOPSY  02/16/2023   Procedure: BIOPSY;  Surgeon: Toledo, Ladell POUR, MD;  Location: [facility];  Service:  Gastroenterology;;   BREAST BIOPSY Right 03/15/2017   PSEUDO-ANGIOMATOUS STROMAL HYPERPLASIA.    CHOLECYSTECTOMY     COLONOSCOPY     CORONARY ANGIOPLASTY WITH STENT PLACEMENT Left 01/21/2006   Procedure: CORONARY ANGIOPLASTY WITH STENT PLACEMENT (Cypher DES x 3 placed; 3.0 x 23 dRCA, 3.0 x 28 mRCA, 3.5 x 23 mm pRCA); Location: [facility]; Surgeon: Marsa Dooms, MD   DILATION AND CURETTAGE OF UTERUS     ESOPHAGOGASTRODUODENOSCOPY     ESOPHAGOGASTRODUODENOSCOPY (EGD) WITH PROPOFOL  N/A 02/16/2023   Procedure: ESOPHAGOGASTRODUODENOSCOPY (EGD) WITH PROPOFOL ;  Surgeon: Toledo, Ladell POUR, MD;  Location: [facility];  Service: Gastroenterology;  Laterality:  N/A;   FINGER ARTHRODESIS Left 03/26/2016   Procedure: ARTHRODESIS FINGER;  Surgeon: Ozell Flake, MD;  Location: [facility] ORS;  Service: Orthopedics;  Laterality: Left;   JOINT REPLACEMENT     SKIN CANCER EXCISION     TOTAL KNEE ARTHROPLASTY Left 02/04/2021   Procedure: TOTAL KNEE ARTHROPLASTY;  Surgeon: Flake Ozell, MD;  Location: [facility] ORS;  Service: Orthopedics;  Laterality: Left;   TUBAL LIGATION      SOCIAL HISTORY:   Social History   Tobacco Use   Smoking status: Never   Smokeless tobacco: Never  Substance Use Topics   Alcohol use: No    Alcohol/week: 0.0 standard drinks of alcohol    FAMILY HISTORY:   Family History  Problem Relation Age of Onset   Breast cancer Maternal Aunt        x 2 (50's and 86's)   Diabetes Mother    Heart failure Mother    Diabetes Sister    Diabetes Brother     DRUG ALLERGIES:  Allergies[1]  REVIEW OF SYSTEMS:   ROS As per history of present illness. All pertinent systems were reviewed above. Constitutional, HEENT, cardiovascular, respiratory, GI, GU, musculoskeletal, neuro, psychiatric, endocrine, integumentary and hematologic systems were reviewed and are otherwise negative/unremarkable except for positive findings mentioned above in the HPI.   MEDICATIONS AT HOME:   Prior to Admission medications  Medication Sig Start Date End Date Taking? Authorizing Provider  acetaminophen  (TYLENOL ) 500 MG tablet Take 500 mg by mouth every 6 (six) hours as needed for mild pain (pain score 1-3).    [provider]  aspirin  EC 81 MG tablet Take 81 mg by mouth daily. Swallow whole.    [provider]  clopidogrel  (PLAVIX ) 75 MG tablet Take 75 mg by mouth daily.  06/27/14   [provider]  Coenzyme Q10 (CO Q-10 PO) Take by mouth daily at 2 am.    [provider]  ezetimibe  (ZETIA ) 10 MG tablet Take 1 tablet (10 mg total) by mouth daily. 07/07/21   Darron Deatrice LABOR, MD  famotidine  (PEPCID ) 20 MG tablet Take 40 mg by  mouth at bedtime. 05/04/20   [provider]  Fexofenadine  HCl (ALLEGRA  ALLERGY PO) Take by mouth. daily    [provider]  Fluticasone  Propionate (FLONASE  ALLERGY RELIEF NA) Place into the nose. 2 sprays each nostril daily    [provider]  losartan  (COZAAR ) 25 MG tablet TAKE 1 TABLET (25 MG TOTAL) BY MOUTH DAILY. 06/12/21 11/19/23  Darron Deatrice LABOR, MD  Menthol , Topical Analgesic, (BIOFREEZE EX) Apply 1 application topically daily as needed (joint pain).    [provider]  metoprolol  (LOPRESSOR ) 50 MG tablet Take 50 mg by mouth 2 (two) times daily.  06/27/14   [provider]  pantoprazole  (PROTONIX ) 40 MG tablet Take 40 mg by mouth daily. 08/30/14  [provider]  rosuvastatin  (CRESTOR ) 10 MG tablet Take 2 tablets (20 mg total) by mouth every evening. 12/16/21   Darron Deatrice LABOR, MD      VITAL SIGNS:  Blood pressure (!) 157/95, pulse 88, temperature (!) 101 F (38.3 C), temperature source Oral, resp. rate 19, SpO2 95%.  PHYSICAL EXAMINATION:  Physical Exam  GENERAL:  68 y.o.-year-old patient lying in the bed with no acute distress.  EYES: Pupils equal, round, reactive to light and accommodation. No scleral icterus. Extraocular muscles intact.  HEENT: Head atraumatic, normocephalic. Oropharynx and nasopharynx clear.  NECK:  Supple, no jugular venous distention. No thyroid  enlargement, no tenderness.  LUNGS: Normal breath sounds bilaterally, no wheezing, rales,rhonchi or crepitation. No use of accessory muscles of respiration.  CARDIOVASCULAR: Regular rate and rhythm, S1, S2 normal. No murmurs, rubs, or gallops.  ABDOMEN: Soft, nondistended, with mild suprapubic tenderness without rebound tenderness guarding or rigidity. Bowel sounds present. No organomegaly or mass.  EXTREMITIES: No pedal edema, cyanosis, or clubbing.  NEUROLOGIC: Cranial nerves II through XII are intact. Muscle strength 5/5 in all extremities. Sensation intact. Gait  not checked.  PSYCHIATRIC: The patient is alert and oriented x 3.  Normal affect and good eye contact. SKIN: No obvious rash, lesion, or ulcer.   LABORATORY PANEL:   CBC Recent Labs  Lab 03/11/24 0106  WBC 5.8  HGB 14.9  HCT 45.2  PLT 188   ------------------------------------------------------------------------------------------------------------------  Chemistries  Recent Labs  Lab 03/11/24 0106  NA 136  K 4.6  CL 101  CO2 25  GLUCOSE 96  BUN 17  CREATININE 0.80  CALCIUM  9.7  AST 21  ALT 23  ALKPHOS 77  BILITOT 0.4   ------------------------------------------------------------------------------------------------------------------  Cardiac Enzymes No results for input(s): TROPONINI in the last 168 hours. ------------------------------------------------------------------------------------------------------------------  RADIOLOGY:  CT ABDOMEN PELVIS W CONTRAST Result Date: 03/11/2024 EXAM: CT ABDOMEN AND PELVIS WITH CONTRAST 03/11/2024 03:57:04 AM TECHNIQUE: CT of the abdomen and pelvis was performed with the administration of 100 mL of iohexol  (OMNIPAQUE ) 300 MG/ML solution. Multiplanar reformatted images are provided for review. Automated exposure control, iterative reconstruction, and/or weight-based adjustment of the mA/kV was utilized to reduce the radiation dose to as low as reasonably achievable. COMPARISON: CT abdomen and pelvis 06/17/2006. CLINICAL HISTORY: 68 year old female with weakness, nausea, lower abdominal pain, frequent urination, started on treatment for urinary tract infection yesterday. FINDINGS: LOWER CHEST: Calcified coronary artery atherosclerosis and/or stents visible (series 2 image 3). Normal heart size. Negative lung bases; mild dependent atelectasis. LIVER: The liver is unremarkable. GALLBLADDER AND BILE DUCTS: Chronic cholecystectomy. No biliary ductal dilatation. SPLEEN: No acute abnormality. PANCREAS: No acute abnormality. ADRENAL GLANDS: No  acute abnormality. KIDNEYS, URETERS AND BLADDER: Bilateral renal enhancement and contrast excretion appears symmetric and normal. There is mild possible inflammatory stranding along the anterior superior urinary bladder wall on series 2 image 82. Otherwise unremarkable bladder, renal collecting systems, and ureters. Incidental pelvic phleboliths. No stones in the kidneys or ureters. No hydronephrosis. No perinephric or periureteral stranding. GI AND BOWEL: Stomach demonstrates no acute abnormality. Normal appendix in the right lower quadrant on series 2 image 65. There is no bowel obstruction. PERITONEUM AND RETROPERITONEUM: No ascites. No free air. VASCULATURE: Calcified aortoiliac atherosclerosis. Major arterial and portal venous structures are patent. Aorta is normal in caliber. LYMPH NODES: No lymphadenopathy. REPRODUCTIVE ORGANS: Surgically absent uterus and diminutive or absent ovaries as in 2008. BONES AND SOFT TISSUES: Osteopenia. Some hyperostosis in the visible lower thoracic spine and occasional interbody ankylosis.  Advanced lower lumbar facet arthropathy including some vacuum facet disease. No acute osseous abnormality. No focal soft tissue abnormality. IMPRESSION: 1. Possible mild inflammatory changes along the anterior superior urinary bladder wall (series 2 image 82), consider UTI or Cystitis. 2. No other acute or inflammatory process identified in the abdomen or pelvis. 3. Calcified aortic, coronary artery atherosclerosis. Electronically signed by: Helayne Hurst MD 03/11/2024 04:07 AM EST RP Workstation: [id number]      IMPRESSION AND PLAN:  Assessment and Plan: * Sepsis due to undetermined organism [facility]) - The patient will be admitted to a medical telemetry observation bed. - This is likely secondary to acute cystitis/acute lower UTI. - Will continue antibiotic therapy with IV Rocephin . - Will follow urine and blood cultures. - Sepsis is manifested by her heart rate and fever. - Will  continue hydration with IV lactated ringer . - Will place her on Pyridium  given her persistent dysuria.  GERD without esophagitis - Will continue PPI therapy as well as H2 blocker therapy.  Coronary artery disease - Will continue aspirin , Plavix , beta-blocker therapy with Toprol  and statin therapy as well as Cozaar .  Dyslipidemia -Will continue statin therapy and Zetia .     DVT prophylaxis: Lovenox .  Advanced Care Planning:  Code Status: full code.  Family Communication:  The plan of care was discussed in details with the patient (and family). I answered all questions. The patient agreed to proceed with the above mentioned plan. Further management will depend upon hospital course. Disposition Plan: Back to previous home environment Consults called: none.  All the records are reviewed and case discussed with ED provider.  Status is: Observation  I certify that at the time of admission, it is my clinical judgment that the patient will require hospital care extending less than 2 midnights.                            Dispo: The patient is from: Home              Anticipated d/c is to: Home              Patient currently is not medically stable to d/c.              Difficult to place patient: No  Madison DELENA Peaches M.D on 03/11/2024 at 5:49 AM  [facility]   From 7 PM-7 AM, contact night-coverage [email or web address]  CC: Primary care physician; Cleotilde Oneil FALCON, MD     [1]  Allergies Allergen Reactions   Molds & Smuts    Other Other (See Comments)    Trees  Cut grass causes eyes water/itch& throat discomfort

## 2024-03-12 ENCOUNTER — Inpatient Hospital Stay: Admit: 2024-03-12 | Discharge: 2024-03-12 | Disposition: A | Attending: Hospitalist

## 2024-03-12 DIAGNOSIS — R7881 Bacteremia: Secondary | ICD-10-CM | POA: Diagnosis not present

## 2024-03-12 DIAGNOSIS — Z96652 Presence of left artificial knee joint: Secondary | ICD-10-CM | POA: Diagnosis present

## 2024-03-12 DIAGNOSIS — A419 Sepsis, unspecified organism: Secondary | ICD-10-CM | POA: Diagnosis present

## 2024-03-12 DIAGNOSIS — I2510 Atherosclerotic heart disease of native coronary artery without angina pectoris: Secondary | ICD-10-CM | POA: Diagnosis present

## 2024-03-12 DIAGNOSIS — Z79899 Other long term (current) drug therapy: Secondary | ICD-10-CM | POA: Diagnosis not present

## 2024-03-12 DIAGNOSIS — E66811 Obesity, class 1: Secondary | ICD-10-CM | POA: Diagnosis present

## 2024-03-12 DIAGNOSIS — A408 Other streptococcal sepsis: Secondary | ICD-10-CM | POA: Diagnosis present

## 2024-03-12 DIAGNOSIS — G4733 Obstructive sleep apnea (adult) (pediatric): Secondary | ICD-10-CM | POA: Diagnosis present

## 2024-03-12 DIAGNOSIS — M797 Fibromyalgia: Secondary | ICD-10-CM | POA: Diagnosis present

## 2024-03-12 DIAGNOSIS — R1030 Lower abdominal pain, unspecified: Secondary | ICD-10-CM | POA: Diagnosis not present

## 2024-03-12 DIAGNOSIS — K219 Gastro-esophageal reflux disease without esophagitis: Secondary | ICD-10-CM | POA: Diagnosis present

## 2024-03-12 DIAGNOSIS — I10 Essential (primary) hypertension: Secondary | ICD-10-CM | POA: Diagnosis present

## 2024-03-12 DIAGNOSIS — Z955 Presence of coronary angioplasty implant and graft: Secondary | ICD-10-CM | POA: Diagnosis not present

## 2024-03-12 DIAGNOSIS — Z9049 Acquired absence of other specified parts of digestive tract: Secondary | ICD-10-CM | POA: Diagnosis not present

## 2024-03-12 DIAGNOSIS — R112 Nausea with vomiting, unspecified: Secondary | ICD-10-CM | POA: Diagnosis not present

## 2024-03-12 DIAGNOSIS — Z7902 Long term (current) use of antithrombotics/antiplatelets: Secondary | ICD-10-CM | POA: Diagnosis not present

## 2024-03-12 DIAGNOSIS — Z981 Arthrodesis status: Secondary | ICD-10-CM | POA: Diagnosis not present

## 2024-03-12 DIAGNOSIS — Z8249 Family history of ischemic heart disease and other diseases of the circulatory system: Secondary | ICD-10-CM | POA: Diagnosis not present

## 2024-03-12 DIAGNOSIS — E785 Hyperlipidemia, unspecified: Secondary | ICD-10-CM | POA: Diagnosis present

## 2024-03-12 DIAGNOSIS — B954 Other streptococcus as the cause of diseases classified elsewhere: Secondary | ICD-10-CM | POA: Diagnosis not present

## 2024-03-12 DIAGNOSIS — Z833 Family history of diabetes mellitus: Secondary | ICD-10-CM | POA: Diagnosis not present

## 2024-03-12 DIAGNOSIS — K589 Irritable bowel syndrome without diarrhea: Secondary | ICD-10-CM | POA: Diagnosis present

## 2024-03-12 DIAGNOSIS — M810 Age-related osteoporosis without current pathological fracture: Secondary | ICD-10-CM | POA: Diagnosis present

## 2024-03-12 DIAGNOSIS — Z87442 Personal history of urinary calculi: Secondary | ICD-10-CM | POA: Diagnosis not present

## 2024-03-12 DIAGNOSIS — Z6833 Body mass index (BMI) 33.0-33.9, adult: Secondary | ICD-10-CM | POA: Diagnosis not present

## 2024-03-12 DIAGNOSIS — Z85828 Personal history of other malignant neoplasm of skin: Secondary | ICD-10-CM | POA: Diagnosis not present

## 2024-03-12 DIAGNOSIS — Z7982 Long term (current) use of aspirin: Secondary | ICD-10-CM | POA: Diagnosis not present

## 2024-03-12 DIAGNOSIS — N3000 Acute cystitis without hematuria: Secondary | ICD-10-CM | POA: Diagnosis present

## 2024-03-12 DIAGNOSIS — R6883 Chills (without fever): Secondary | ICD-10-CM | POA: Diagnosis not present

## 2024-03-12 DIAGNOSIS — I330 Acute and subacute infective endocarditis: Secondary | ICD-10-CM | POA: Diagnosis present

## 2024-03-12 DIAGNOSIS — R300 Dysuria: Secondary | ICD-10-CM | POA: Diagnosis not present

## 2024-03-12 LAB — URINE CULTURE: Culture: NO GROWTH

## 2024-03-12 LAB — ECHOCARDIOGRAM COMPLETE
AR max vel: 2.11 cm2
AV Peak grad: 8 mmHg
Ao pk vel: 1.41 m/s
Area-P 1/2: 2.24 cm2
Height: 62 in
S' Lateral: 2.6 cm
Weight: 2934.76 [oz_av]

## 2024-03-12 LAB — GLUCOSE, CAPILLARY
Glucose-Capillary: 98 mg/dL (ref 70–99)
Glucose-Capillary: 133 mg/dL — ABNORMAL HIGH (ref 70–99)
Glucose-Capillary: 118 mg/dL — ABNORMAL HIGH (ref 70–99)

## 2024-03-12 NOTE — Progress Notes (Signed)
°  Progress Note   Patient: Judy Prince FMW:[id number] DOB: Apr 12, 1955 DOA: 03/11/2024     0 DOS: the patient was seen and examined on 03/12/2024   Brief hospital course: 68 year old female with history of CAD, IBS, gastritis, fibromyalgia, history of stone came into ED complaining of abdominal pain and dysuria.  She went to primary care's office with some urinary frequency and thought to have a urinary tract infection, called PCP office and they prescribed her Macrobid for 2 days.  Patient is stated that she was compliant with the medications.  Even after 36 hours of treatment, patient did not have improvement in her urinary symptoms and decided to come to the emergency room for evaluation. She was admitted from the emergency room with UTI and sepsis.  She had temperature of 100.2.  Lactic acid was 3.  Assessment and Plan:  Sepsis due to undetermined organism [facility]), likely Streptococcus -Patient's symptoms improved with antibiotics, ceftriaxone , IV fluid and Pyridium  - Will continue to monitor patient's symptoms - Blood cultures showed Streptococcus bacteremia - Continue ceftriaxone  and follow-up cultures - 2 sets of blood cultures have been repeated 12/14 - Echocardiogram has been ordered due to bacteremia  GERD without esophagitis - Continue H2 blocker therapy.   Coronary artery disease - Continue aspirin , Plavix , beta-blocker therapy with Toprol  and statin therapy as well as Cozaar .   Dyslipidemia -Continue statin Crestor  therapy and Zetia .        Subjective: Feels better no fever no chills  Physical Exam: Vitals:   03/11/24 2005 03/11/24 2349 03/12/24 0347 03/12/24 0715  BP: (!) 143/67 (!) 146/77 (!) 141/66 (!) 147/80  Pulse: 77 63 62 66  Resp:    14  Temp: 97.9 F (36.6 C) 98.2 F (36.8 C) 98.5 F (36.9 C) 98.6 F (37 C)  TempSrc: Oral  Oral Oral  SpO2: 95% 97% 95% 96%  Weight:      Height:       Constitutional: Alert, awake, calm, comfortable HEENT: Neck  supple Respiratory: Clear to auscultation B/L, no wheezing, no rales.  Cardiovascular: Regular rate and rhythm, no murmurs / rubs / gallops. No extremity edema. 2+ pedal pulses. No carotid bruits.  Abdomen: Soft, no tenderness, Bowel sounds positive.  Musculoskeletal: no clubbing / cyanosis. Good ROM, no contractures. Normal muscle tone.  Skin: no rashes, lesions, ulcers. Neurologic: CN 2-12 grossly intact. Sensation intact, No focal deficit identified Psychiatric: Alert and oriented x 3. Normal mood.    Data Reviewed:  Reviewed blood cultures grew Streptococcus bacteremia  Family Communication: None available  Disposition: Status is: Changed to inpatient due to sepsis and bacteremia 12/14 Remains inpatient appropriate because: Ongoing recovery from sepsis and Streptococcus bacteremia requiring further antibiotics, further workup with blood cultures and echocardiogram  Planned Discharge Destination: Home    Time spent: 35 minutes  Author: Nena Rebel, MD 03/12/2024 11:39 AM  For on call review [email or web address].

## 2024-03-12 NOTE — Progress Notes (Signed)
 Mobility Specialist - Progress Note     03/12/24 1424  Mobility  Activity Ambulated independently;Stood at bedside  Level of Assistance Independent after set-up  Assistive Device None  Distance Ambulated (ft) 320 ft  Range of Motion/Exercises Active  Activity Response Tolerated well  Mobility Referral Yes  Mobility visit 1 Mobility   Pt resting in bed on RA upon entry. Pt STS and ambulates to hallway around NS for 2 laps Indep with no AD. Pt endorses no pain or dizziness during mobility. Pt returned to bed and left with needs in reach.   Guido Rumble Mobility Specialist 03/12/2024, 2:48 PM

## 2024-03-12 NOTE — Progress Notes (Signed)
 Echocardiogram 2D Echocardiogram has been performed.  Azariah Bonura N Tawfiq Favila,RDCS 03/12/2024, 1:23 PM

## 2024-03-12 NOTE — Plan of Care (Signed)
  Problem: Clinical Measurements: Goal: Will remain free from infection Outcome: Progressing   Problem: Clinical Measurements: Goal: Diagnostic test results will improve Outcome: Progressing   Problem: Safety: Goal: Ability to remain free from injury will improve Outcome: Progressing

## 2024-03-13 DIAGNOSIS — R300 Dysuria: Secondary | ICD-10-CM

## 2024-03-13 DIAGNOSIS — R112 Nausea with vomiting, unspecified: Secondary | ICD-10-CM

## 2024-03-13 DIAGNOSIS — B954 Other streptococcus as the cause of diseases classified elsewhere: Secondary | ICD-10-CM

## 2024-03-13 DIAGNOSIS — R6883 Chills (without fever): Secondary | ICD-10-CM

## 2024-03-13 DIAGNOSIS — R1030 Lower abdominal pain, unspecified: Secondary | ICD-10-CM

## 2024-03-13 DIAGNOSIS — R7881 Bacteremia: Secondary | ICD-10-CM

## 2024-03-13 LAB — GLUCOSE, CAPILLARY
Glucose-Capillary: 96 mg/dL (ref 70–99)
Glucose-Capillary: 161 mg/dL — ABNORMAL HIGH (ref 70–99)

## 2024-03-13 MED ORDER — POLYETHYLENE GLYCOL 3350 17 G PO PACK
17.0000 g | PACK | Freq: Every day | ORAL | Status: DC
  Filled 2024-03-13: qty 1

## 2024-03-13 NOTE — Consult Note (Signed)
 Infectious Disease     Reason for Consult:Bacteremia    Referring Physician: Roann Gouty, MD  Date of Admission:  03/11/2024   Principal Problem:   Sepsis due to undetermined organism [facility] 2) Active Problems:   Dyslipidemia   Coronary artery disease   GERD without esophagitis   Acute cystitis without hematuria   Bacteremia   HPI: Judy Prince is a 68 y.o. female with a history of multiple medical problems as below admitted December 13 with acute onset of chills abdominal pain dysuria weakness nausea and vomiting.  She had started Macrobid the day prior admission as she been having some urinary complaints but had not had a urine sample tested..   On admit she was febrile to 101.  White count is normal.  Creatinine was normal.  Lactic acid was elevated at 3.0.  Urine sample showed no whites or reds.  CT abdomen pelvis showed possible mild inflammatory changes on anterior superior urinary bladder wall but otherwise negative.  She was started on ceftriaxone .  Since admitted blood cultures from December 13 positive for strep mitis.  Follow-up blood cultures done December 14 and no growth to date.  She had an echocardiogram that was negative for vegetation .  She does report she has dental work a few weeks ago when she had a cavity in the feeling placed.  She is not having any pain at the site. She denies any prodrome of symptoms over the last several weeks.  Has not had fevers chills night sweats or weight loss.  Past Medical History:  Diagnosis Date   Anxiety    Arthritis    B12 deficiency    Basal cell carcinoma (BCC) of forearm    Coronary artery disease    a.) LHC 01/21/2006: 100 % pRCA, 100% mRCA, 100% dRCA. PCI peformed; 80% mRCA - PCI complicated by spiral dissection --> 3.0x 23mm Cypher DES to dRCA, 3.0x44mm Cypher DES to mRCA, and 3.5x23 mm Cypher DES pRCA. b.) 07/2020 MV: EF>65%, no ischemia/infarct.   Cowden disease (HCC)    Diastolic dysfunction    a.) 07/2020 Echo: EF 60-65%,  no rwma, G1DD, nl RV fxn. Mildly dil LA. Triv MR.   Esophagitis, erosive    Fibromyalgia    Gastritis    GERD (gastroesophageal reflux disease)    Hemorrhoids    History of kidney stones    Hyperlipidemia LDL goal <70    Hypertension    IBS (irritable bowel syndrome)    Lichen planus    Long term (current) use of antithrombotics/antiplatelets    a.) DAPT (ASA + clopidogrel )   Low serum vitamin D    Morbid obesity (HCC)    OSA (obstructive sleep apnea)    a.) waiting on CPAP machine as of 01/22/2021   Osteoporosis    Panic disorder    Tubular adenoma    Past Surgical History:  Procedure Laterality Date   ABDOMINAL HYSTERECTOMY     BIOPSY  02/16/2023   Procedure: BIOPSY;  Surgeon: Aundria, Ladell POUR, MD;  Location: [facility] ENDOSCOPY;  Service: Gastroenterology;;   BREAST BIOPSY Right 03/15/2017   PSEUDO-ANGIOMATOUS STROMAL HYPERPLASIA.    CHOLECYSTECTOMY     COLONOSCOPY     CORONARY ANGIOPLASTY WITH STENT PLACEMENT Left 01/21/2006   Procedure: CORONARY ANGIOPLASTY WITH STENT PLACEMENT (Cypher DES x 3 placed; 3.0 x 23 dRCA, 3.0 x 28 mRCA, 3.5 x 23 mm pRCA); Location: [facility]; Surgeon: Marsa Dooms, MD   DILATION AND CURETTAGE OF UTERUS     ESOPHAGOGASTRODUODENOSCOPY  ESOPHAGOGASTRODUODENOSCOPY (EGD) WITH PROPOFOL  N/A 02/16/2023   Procedure: ESOPHAGOGASTRODUODENOSCOPY (EGD) WITH PROPOFOL ;  Surgeon: Toledo, Ladell POUR, MD;  Location: [facility] ENDOSCOPY;  Service: Gastroenterology;  Laterality: N/A;   FINGER ARTHRODESIS Left 03/26/2016   Procedure: ARTHRODESIS FINGER;  Surgeon: Ozell Flake, MD;  Location: [facility] ORS;  Service: Orthopedics;  Laterality: Left;   JOINT REPLACEMENT     SKIN CANCER EXCISION     TOTAL KNEE ARTHROPLASTY Left 02/04/2021   Procedure: TOTAL KNEE ARTHROPLASTY;  Surgeon: Flake Ozell, MD;  Location: [facility] ORS;  Service: Orthopedics;  Laterality: Left;   TUBAL LIGATION     Social History[1] Family History  Problem Relation Age of Onset   Breast cancer  Maternal Aunt        x 2 (50's and 23's)   Diabetes Mother    Heart failure Mother    Diabetes Sister    Diabetes Brother     Allergies: Allergies[2]  Current antibiotics: Antibiotics Given (last 72 hours)     Date/Time Action Medication Dose Rate   03/11/24 0608 New Bag/Given   cefTRIAXone  (ROCEPHIN ) 2 g in sodium chloride  0.9 % 100 mL IVPB 2 g 200 mL/hr   03/12/24 0523 New Bag/Given   cefTRIAXone  (ROCEPHIN ) 2 g in sodium chloride  0.9 % 100 mL IVPB 2 g 200 mL/hr   03/13/24 0615 New Bag/Given   cefTRIAXone  (ROCEPHIN ) 2 g in sodium chloride  0.9 % 100 mL IVPB 2 g 200 mL/hr       MEDICATIONS:  aspirin  EC  81 mg Oral Daily   clopidogrel   75 mg Oral Daily   enoxaparin  (LOVENOX ) injection  40 mg Subcutaneous Q24H   ezetimibe   10 mg Oral Daily   famotidine   40 mg Oral QHS   fluticasone   2 spray Each Nare Daily   losartan   25 mg Oral Daily   metoprolol  tartrate  50 mg Oral BID   rosuvastatin   20 mg Oral QPM    Review of Systems - 11 systems reviewed and negative per HPI   OBJECTIVE: Temp:  [97.4 F (36.3 C)-98.8 F (37.1 C)] 97.8 F (36.6 C) (12/15 0728) Pulse Rate:  [56-75] 56 (12/15 0728) Resp:  [17-18] 18 (12/15 0728) BP: (107-154)/(56-94) 144/56 (12/15 0728) SpO2:  [94 %-97 %] 94 % (12/15 0728) Physical Exam  Constitutional:  oriented to person, place, and time. appears well-developed and well-nourished. No distress.  HENT: [place]/AT, PERRLA, no scleral icterus Mouth/Throat: Oropharynx is clear and moist. No oropharyngeal exudate. L lwoer molar tooth with cap but no drainage, tenderness Cardiovascular: Normal rate, regular rhythm and normal heart sounds. Exam reveals no gallop and no friction rub.  Pulmonary/Chest: Effort normal and breath sounds normal. No respiratory distress.  has no wheezes.  Neck = supple, no nuchal rigidity Abdominal: Soft. Bowel sounds are normal.  exhibits no distension. There is no tenderness.  Lymphadenopathy: no cervical adenopathy. No  axillary adenopathy Neurological: alert and oriented to person, place, and time.  Skin: Skin is warm and dry. No rash noted. No erythema.  Psychiatric: a normal mood and affect.  behavior is normal.    LABS: Results for orders placed or performed during the hospital encounter of 03/11/24 (from the past 48 hours)  Glucose, capillary     Status: None   Collection Time: 03/12/24  9:11 AM  Result Value Ref Range   Glucose-Capillary 98 70 - 99 mg/dL    Comment: Glucose reference range applies only to samples taken after fasting for at least 8 hours.  Culture, blood (  Routine X 2) w Reflex to ID Panel     Status: None (Preliminary result)   Collection Time: 03/12/24 11:21 AM   Specimen: BLOOD LEFT HAND  Result Value Ref Range   Specimen Description BLOOD LEFT HAND    Special Requests      BOTTLES DRAWN AEROBIC AND ANAEROBIC Blood Culture adequate volume   Culture      NO GROWTH < 24 HOURS Performed at [facility], [address], [place], [place] [address]    Report Status PENDING   Culture, blood (Routine X 2) w Reflex to ID Panel     Status: None (Preliminary result)   Collection Time: 03/12/24 11:21 AM   Specimen: BLOOD RIGHT HAND  Result Value Ref Range   Specimen Description BLOOD RIGHT HAND    Special Requests      BOTTLES DRAWN AEROBIC AND ANAEROBIC Blood Culture results may not be optimal due to an inadequate volume of blood received in culture bottles   Culture      NO GROWTH < 24 HOURS Performed at [facility], [address] [place] [address], [place], [place] [address]    Report Status PENDING   Glucose, capillary     Status: Abnormal   Collection Time: 03/12/24  4:08 PM  Result Value Ref Range   Glucose-Capillary 133 (H) 70 - 99 mg/dL    Comment: Glucose reference range applies only to samples taken after fasting for at least 8 hours.  Glucose, capillary     Status: Abnormal   Collection Time: 03/12/24  7:53 PM  Result Value Ref Range   Glucose-Capillary 118 (H)  70 - 99 mg/dL    Comment: Glucose reference range applies only to samples taken after fasting for at least 8 hours.  Glucose, capillary     Status: None   Collection Time: 03/13/24  7:32 AM  Result Value Ref Range   Glucose-Capillary 96 70 - 99 mg/dL    Comment: Glucose reference range applies only to samples taken after fasting for at least 8 hours.   No components found for: ESR, C REACTIVE PROTEIN MICRO: Recent Results (from the past 720 hours)  Urine Culture     Status: None   Collection Time: 03/11/24  1:06 AM   Specimen: Urine, Clean Catch  Result Value Ref Range Status   Specimen Description   Final    URINE, CLEAN CATCH Performed at [facility], [address], [place], [place] [address]    Special Requests   Final    NONE Performed at [facility], [address] [place], [place] [address]    Culture   Final    NO GROWTH Performed at [facility], [address] [place], [place] [address]    Report Status 03/12/2024 FINAL  Final  Blood culture (routine x 2)     Status: None (Preliminary result)   Collection Time: 03/11/24  4:25 AM   Specimen: BLOOD  Result Value Ref Range Status   Specimen Description   Final    BLOOD BLOOD LEFT ARM Performed at [facility], [address], [place], [place] [address]    Special Requests   Final    BOTTLES DRAWN AEROBIC AND ANAEROBIC Blood Culture results may not be optimal due to an inadequate volume of blood received in culture bottles Performed at [facility], [address] [place], [place] [address]    Culture  Setup Time   Final    Organism ID  to follow GRAM POSITIVE COCCI ANAEROBIC BOTTLE ONLY CRITICAL RESULT CALLED TO, READ BACK BY AND VERIFIED WITH: TIFFANY GILCHRIST ON 03/11/24 AT 1704 QSD Performed at [facility], [address], [place], [place] [address]    Culture   Final    VONNE POSITIVE COCCI IDENTIFICATION TO FOLLOW Performed at [facility], [address], [place], [place] [address]    Report Status PENDING  Incomplete  Blood Culture ID Panel (Reflexed)     Status: Abnormal   Collection Time: 03/11/24  4:25 AM  Result Value Ref Range Status   Enterococcus faecalis NOT DETECTED NOT DETECTED Final   Enterococcus Faecium NOT DETECTED NOT DETECTED Final   Listeria monocytogenes NOT DETECTED NOT DETECTED Final   Staphylococcus species NOT DETECTED NOT DETECTED Final   Staphylococcus aureus (BCID) NOT DETECTED NOT DETECTED Final   Staphylococcus epidermidis NOT DETECTED NOT DETECTED Final   Staphylococcus lugdunensis NOT DETECTED NOT DETECTED Final   Streptococcus species DETECTED (A) NOT DETECTED Final    Comment: Not Enterococcus species, Streptococcus agalactiae, Streptococcus pyogenes, or Streptococcus pneumoniae. CRITICAL RESULT CALLED TO, READ BACK BY AND VERIFIED WITH: TIFFANY GILCHRIST ON 03/11/24 AT 1704 QSD    Streptococcus agalactiae NOT DETECTED NOT DETECTED Final   Streptococcus pneumoniae NOT DETECTED NOT DETECTED Final   Streptococcus pyogenes NOT DETECTED NOT DETECTED Final   A.calcoaceticus-baumannii NOT DETECTED NOT DETECTED Final   Bacteroides fragilis NOT DETECTED NOT DETECTED Final   Enterobacterales NOT DETECTED NOT DETECTED Final   Enterobacter cloacae complex NOT DETECTED NOT DETECTED Final   Escherichia coli NOT DETECTED NOT DETECTED Final   Klebsiella aerogenes NOT DETECTED NOT DETECTED Final   Klebsiella oxytoca NOT DETECTED NOT DETECTED Final   Klebsiella pneumoniae NOT DETECTED NOT DETECTED Final   Proteus species NOT DETECTED NOT DETECTED Final   Salmonella species NOT DETECTED NOT DETECTED Final   Serratia marcescens NOT DETECTED NOT DETECTED Final   Haemophilus influenzae NOT DETECTED NOT DETECTED Final   Neisseria meningitidis NOT DETECTED NOT DETECTED Final   Pseudomonas aeruginosa NOT DETECTED NOT DETECTED Final   Stenotrophomonas maltophilia NOT DETECTED NOT DETECTED Final    Candida albicans NOT DETECTED NOT DETECTED Final   Candida auris NOT DETECTED NOT DETECTED Final   Candida glabrata NOT DETECTED NOT DETECTED Final   Candida krusei NOT DETECTED NOT DETECTED Final   Candida parapsilosis NOT DETECTED NOT DETECTED Final   Candida tropicalis NOT DETECTED NOT DETECTED Final   Cryptococcus neoformans/gattii NOT DETECTED NOT DETECTED Final    Comment: Performed at [facility], [address] [place], [place] [address]  Blood culture (routine x 2)     Status: Abnormal (Preliminary result)   Collection Time: 03/11/24  4:30 AM   Specimen: BLOOD  Result Value Ref Range Status   Specimen Description   Final    BLOOD BLOOD RIGHT ARM Performed at [facility], [address] [place], [place] [address]    Special Requests   Final    BOTTLES DRAWN AEROBIC AND ANAEROBIC Blood Culture results may not be optimal due to an inadequate volume of blood received in culture bottles Performed at [facility], [address] [place], [place] [address]    Culture  Setup Time   Final    GRAM POSITIVE COCCI ANAEROBIC BOTTLE ONLY CRITICAL VALUE NOTED.  VALUE IS CONSISTENT WITH PREVIOUSLY REPORTED AND CALLED VALUE. Performed at [facility], [address] [place], [place] [address]    Culture (A)  Final    STREPTOCOCCUS MITIS/ORALIS SUSCEPTIBILITIES TO FOLLOW Performed at [facility], [address], [place], [place] [address]    Report Status PENDING  Incomplete  Culture, blood (Routine X 2) w Reflex to ID Panel     Status: None (Preliminary result)   Collection Time: 03/12/24 11:21 AM   Specimen: BLOOD LEFT HAND  Result Value Ref Range Status   Specimen Description BLOOD LEFT HAND  Final   Special Requests   Final    BOTTLES DRAWN AEROBIC AND ANAEROBIC Blood Culture adequate volume   Culture   Final    NO GROWTH < 24 HOURS Performed at [facility], [address] [place], [place] [address]    Report  Status PENDING  Incomplete  Culture, blood (Routine X 2) w Reflex to ID Panel     Status: None (Preliminary result)   Collection Time: 03/12/24 11:21 AM   Specimen: BLOOD RIGHT HAND  Result Value Ref Range Status   Specimen Description BLOOD RIGHT HAND  Final   Special Requests   Final    BOTTLES DRAWN AEROBIC AND ANAEROBIC Blood Culture results may not be optimal due to an inadequate volume of blood received in culture bottles   Culture   Final    NO GROWTH < 24 HOURS Performed at [facility], [address] [place], [place] [address]    Report Status PENDING  Incomplete    IMAGING: ECHOCARDIOGRAM COMPLETE Result Date: 03/12/2024    ECHOCARDIOGRAM REPORT   Patient Name:   Judy Prince Date of Exam: 03/12/2024 Medical Rec #:  [id number]      Height:       62.0 in Accession #:    [id number]     Weight:       183.4 lb Date of Birth:  12/03/1955       BSA:          1.843 m Patient Age:    68 years       BP:           147/80 mmHg Patient Gender: F              HR:           60 bpm. Exam Location:  [facility] Procedure: 2D Echo, 3D Echo, Color Doppler, Cardiac Doppler and Strain Analysis            (Both Spectral and Color Flow Doppler were utilized during            procedure). Indications:     Bacteremia  History:         Patient has prior history of Echocardiogram examinations, most                  recent 08/08/2020. CAD, Signs/Symptoms:Chest Pain and Murmur;                  Risk Factors:Hypertension, Dyslipidemia and Sleep Apnea.  Sonographer:     Logan Shove RDCS Referring Phys:  [id number] [facility] PAUDEL Diagnosing Phys: Shelda Bruckner MD IMPRESSIONS  1. Left ventricular ejection fraction, by estimation, is 65 to 70%. The left ventricle has normal function. The left ventricle has no regional wall motion abnormalities. Left ventricular diastolic parameters are consistent with Grade I diastolic dysfunction (impaired relaxation). The average left ventricular global longitudinal strain is  -20.4 %. The global longitudinal strain is normal.  2. Right ventricular systolic function is normal. The right ventricular size is normal.  3. The mitral valve is grossly normal. No evidence of mitral valve regurgitation. No evidence of mitral stenosis.  4. The aortic valve is tricuspid. There is mild calcification of the aortic valve. Aortic valve regurgitation is not visualized. No aortic stenosis is present.  5. The inferior vena cava is normal in size with greater than 50% respiratory variability, suggesting right atrial pressure of 3 mmHg. Comparison(s): No significant change from prior study. Conclusion(s)/Recommendation(s): Normal biventricular function without evidence of hemodynamically significant valvular heart disease. No evidence of valvular vegetations on this transthoracic echocardiogram. Consider a transesophageal echocardiogram to exclude infective endocarditis if clinically indicated. FINDINGS  Left Ventricle: Left ventricular ejection fraction, by estimation, is 65 to 70%. The left ventricle has normal function. The left ventricle has no regional wall motion abnormalities. The average left ventricular global longitudinal strain is -20.4 %. Strain was performed and the global longitudinal strain is normal. The left ventricular internal cavity size was normal in size. There is no left ventricular hypertrophy. Left ventricular diastolic parameters are consistent with Grade I diastolic dysfunction (impaired relaxation). Right Ventricle: The right ventricular size is normal. Right vetricular wall thickness was not well visualized. Right ventricular systolic function is normal. Left Atrium: Left atrial size was normal in size. Right Atrium: Right atrial size was normal in size. Pericardium: There is no evidence of pericardial effusion. Presence of epicardial fat layer. Mitral Valve: The mitral valve is grossly normal. Mild mitral annular calcification. No evidence of mitral valve regurgitation. No  evidence of mitral valve stenosis. Tricuspid Valve: The tricuspid valve is grossly normal. Tricuspid valve regurgitation is trivial. No evidence of tricuspid stenosis. Aortic Valve: The aortic valve is tricuspid. There is mild calcification of the aortic valve. Aortic valve regurgitation is not visualized. No aortic stenosis is present. Aortic valve peak gradient measures 8.0 mmHg. Pulmonic Valve: The pulmonic valve was not well visualized. Pulmonic valve regurgitation is trivial. No evidence of pulmonic stenosis. Aorta: The aortic root, ascending aorta and aortic arch are all structurally normal, with no evidence of dilitation or obstruction. Venous: The inferior vena cava is normal in size with greater than 50% respiratory variability, suggesting right atrial pressure of 3 mmHg. IAS/Shunts: The atrial septum is grossly normal. Additional Comments: 3D was performed not requiring image post processing on an independent workstation and was normal.  LEFT VENTRICLE PLAX 2D LVIDd:         4.10 cm   Diastology LVIDs:         2.60 cm   LV e' medial:    6.31 cm/s LV PW:         0.70 cm   LV E/e' medial:  10.5 LV IVS:        0.90 cm   LV e' lateral:   7.62 cm/s LVOT diam:     1.90 cm   LV E/e' lateral: 8.7 LVOT Area:     2.84 cm                          2D Longitudinal Strain                          2D Strain GLS (A4C):   -20.9 %                          2D Strain GLS (A3C):   -20.4 %  2D Strain GLS (A2C):   -20.0 %                          2D Strain GLS Avg:     -20.4 %                           3D Volume EF:                          3D EF:        61 %                          LV EDV:       95 ml                          LV ESV:       37 ml                          LV SV:        57 ml RIGHT VENTRICLE            IVC RV Basal diam:  2.70 cm    IVC diam: 1.50 cm RV S prime:     8.27 cm/s TAPSE (M-mode): 2.1 cm LEFT ATRIUM             Index        RIGHT ATRIUM           Index LA diam:        3.90 cm  2.12 cm/m   RA Area:     13.80 cm LA Vol (A2C):   46.3 ml 25.13 ml/m  RA Volume:   28.10 ml  15.25 ml/m LA Vol (A4C):   36.5 ml 19.81 ml/m LA Biplane Vol: 42.7 ml 23.17 ml/m  AORTIC VALVE AV Area (Vmax): 2.11 cm AV Vmax:        141.00 cm/s AV Peak Grad:   8.0 mmHg LVOT Vmax:      105.00 cm/s  AORTA Ao Root diam: 2.80 cm Ao Asc diam:  2.80 cm MITRAL VALVE MV Area (PHT): 2.24 cm     SHUNTS MV Decel Time: 338 msec     Systemic Diam: 1.90 cm MV E velocity: 66.00 cm/s MV A velocity: 101.00 cm/s MV E/A ratio:  0.65 Shelda Bruckner MD Electronically signed by Shelda Bruckner MD Signature Date/Time: 03/12/2024/2:59:01 PM    Final    CT ABDOMEN PELVIS W CONTRAST Result Date: 03/11/2024 EXAM: CT ABDOMEN AND PELVIS WITH CONTRAST 03/11/2024 03:57:04 AM TECHNIQUE: CT of the abdomen and pelvis was performed with the administration of 100 mL of iohexol  (OMNIPAQUE ) 300 MG/ML solution. Multiplanar reformatted images are provided for review. Automated exposure control, iterative reconstruction, and/or weight-based adjustment of the mA/kV was utilized to reduce the radiation dose to as low as reasonably achievable. COMPARISON: CT abdomen and pelvis 06/17/2006. CLINICAL HISTORY: 68 year old female with weakness, nausea, lower abdominal pain, frequent urination, started on treatment for urinary tract infection yesterday. FINDINGS: LOWER CHEST: Calcified coronary artery atherosclerosis and/or stents visible (series 2 image 3). Normal heart size. Negative lung bases; mild dependent atelectasis. LIVER: The liver is unremarkable. GALLBLADDER AND BILE DUCTS: Chronic cholecystectomy. No biliary ductal dilatation. SPLEEN: No acute abnormality. PANCREAS: No acute abnormality. ADRENAL GLANDS:  No acute abnormality. KIDNEYS, URETERS AND BLADDER: Bilateral renal enhancement and contrast excretion appears symmetric and normal. There is mild possible inflammatory stranding along the anterior superior urinary bladder wall on  series 2 image 82. Otherwise unremarkable bladder, renal collecting systems, and ureters. Incidental pelvic phleboliths. No stones in the kidneys or ureters. No hydronephrosis. No perinephric or periureteral stranding. GI AND BOWEL: Stomach demonstrates no acute abnormality. Normal appendix in the right lower quadrant on series 2 image 65. There is no bowel obstruction. PERITONEUM AND RETROPERITONEUM: No ascites. No free air. VASCULATURE: Calcified aortoiliac atherosclerosis. Major arterial and portal venous structures are patent. Aorta is normal in caliber. LYMPH NODES: No lymphadenopathy. REPRODUCTIVE ORGANS: Surgically absent uterus and diminutive or absent ovaries as in 2008. BONES AND SOFT TISSUES: Osteopenia. Some hyperostosis in the visible lower thoracic spine and occasional interbody ankylosis. Advanced lower lumbar facet arthropathy including some vacuum facet disease. No acute osseous abnormality. No focal soft tissue abnormality. IMPRESSION: 1. Possible mild inflammatory changes along the anterior superior urinary bladder wall (series 2 image 82), consider UTI or Cystitis. 2. No other acute or inflammatory process identified in the abdomen or pelvis. 3. Calcified aortic, coronary artery atherosclerosis. Electronically signed by: Helayne Hurst MD 03/11/2024 04:07 AM EST RP Workstation: [id number]    Assessment:   Judy Prince is a 68 y.o. female with a history of anxiety arthritis coronary artery disease status post stenting diastolic dysfunction GERD hypertension hyperlipidemia IBS sleep apnea admitted with dysuria urinary frequency lower abdominal pain chills weakness and nausea with vomiting.  She had previously just started an antibiotic for possible UTI.  Was found to have strep mitis bacteremia in 2 of 2 sets.  She also had CT findings with possible inflammation in her bladder although urinalysis on admission only showed 0-5 white cells and urine cultures no growth. Strep mitis is a common  cause of subacute bacterial endocarditis usually from an oral source.  This seems relatively acute since her dental work was only about 2 weeks ago.  Follow-up blood cultures are pending. Echocardiogram transthoracic shows no vegetations, mild calcification of the aortic valve but otherwise normal  Recommendations Continue ceftriaxone  for now pending the results of cultures as strep mitis can be resistant. 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    Thank you very much for allowing me to participate in the care of this patient. Please call with questions.   Alm SQUIBB. Epifanio, MD       [1]  Social History Tobacco Use   Smoking status: Never   Smokeless tobacco: Never  Vaping Use   Vaping status: Never Used  Substance Use Topics   Alcohol use: No    Alcohol/week: 0.0 standard drinks of alcohol   Drug use: No  [2]  Allergies Allergen Reactions   Molds & Smuts    Other Other (See Comments)    Trees  Cut grass causes eyes water/itch& throat discomfort

## 2024-03-13 NOTE — Progress Notes (Signed)
 TOC Brief Assessment Note  Patient is from home. Admitted with Sespsis/UTI, being treated with IV ABX. No TOC needs at this time.

## 2024-03-13 NOTE — Progress Notes (Signed)
°  Progress Note   Patient: Judy Prince FMW:[id number] DOB: Aug 25, 1955 DOA: 03/11/2024     1 DOS: the patient was seen and examined on 03/13/2024   Brief hospital course: 68 year old female with history of CAD, IBS, gastritis, fibromyalgia, history of stone came into ED complaining of abdominal pain and dysuria.  She went to primary care's office with some urinary frequency and thought to have a urinary tract infection, called PCP office and they prescribed her Macrobid for 2 days.  Patient is stated that she was compliant with the medications.  Even after 36 hours of treatment, patient did not have improvement in her urinary symptoms and decided to come to the emergency room for evaluation. She was admitted from the emergency room with UTI and sepsis.  She had temperature of 100.2.  Lactic acid was 3.  Assessment and Plan:  Sepsis due to undetermined organism (HCC), likely Streptococcus -Patient's symptoms improved with antibiotics, ceftriaxone , IV fluid and Pyridium  - Will continue to monitor patient's symptoms - Blood cultures showed Streptococcus bacteremia, grew strep mitis likely source is dental as she had dental work recently.  Specification and sensitivity is pending. - Continue ceftriaxone  and follow-up cultures - 2 sets of blood cultures have been repeated 12/14 - Echocardiogram has been ordered due to bacteremia, no vegetation - ID consultation has been requested and Dr. Epifanio will see the patient   GERD without esophagitis - Continue H2 blocker therapy.   Coronary artery disease - Continue aspirin , Plavix , beta-blocker therapy with Toprol  and statin therapy as well as Cozaar .   Dyslipidemia -Continue statin Crestor  therapy and Zetia .        Subjective: Feels better denies any fever or chills.  Physical Exam: Vitals:   03/12/24 1929 03/12/24 2355 03/13/24 0357 03/13/24 0728  BP: (!) 107/94 (!) 150/64 136/63 (!) 144/56  Pulse: 75 61 (!) 58 (!) 56  Resp: 18 18  18 18   Temp: 98.4 F (36.9 C) 98.1 F (36.7 C) (!) 97.4 F (36.3 C) 97.8 F (36.6 C)  TempSrc:    Oral  SpO2: 97% 95% 95% 94%  Weight:      Height:       Constitutional: Alert, awake, calm, comfortable HEENT: Neck supple Respiratory: Clear to auscultation B/L, no wheezing, no rales.  Cardiovascular: Regular rate and rhythm, no murmurs / rubs / gallops. No extremity edema. 2+ pedal pulses. No carotid bruits.  Abdomen: Soft, no tenderness, Bowel sounds positive.  Musculoskeletal: no clubbing / cyanosis. Good ROM, no contractures. Normal muscle tone.  Skin: no rashes, lesions, ulcers. Neurologic: CN 2-12 grossly intact. Sensation intact, No focal deficit identified Psychiatric: Alert and oriented x 3. Normal mood.    Data Reviewed:  Reviewed blood cultures grew strep mitis  Family Communication: Husband was at bedside  Disposition: Status is: Inpatient Remains inpatient appropriate because: Ongoing recovery from sepsis and bacteremia due to Streptococcus mitis  Planned Discharge Destination: Home    Time spent: 35 minutes  Author: Nena Rebel, MD 03/13/2024 3:43 PM  For on call review [email or web address].

## 2024-03-14 ENCOUNTER — Encounter: Payer: Self-pay | Admitting: Hospitalist

## 2024-03-14 LAB — GLUCOSE, CAPILLARY: Glucose-Capillary: 113 mg/dL — ABNORMAL HIGH (ref 70–99)

## 2024-03-14 MED ORDER — PANTOPRAZOLE SODIUM 40 MG PO TBEC
40.0000 mg | DELAYED_RELEASE_TABLET | Freq: Once | ORAL | Status: AC | PRN
  Administered 2024-03-14: 17:00:00 40 mg via ORAL
  Filled 2024-03-14: qty 1

## 2024-03-14 NOTE — Plan of Care (Signed)

## 2024-03-14 NOTE — TOC Progression Note (Signed)
 Transition of Care [facility]) - Progression Note    Patient Details  Name: Judy Prince MRN: [id number] Date of Birth: 1956-03-07  Transition of Care [facility]) CM/SW Contact  Dalia GORMAN Fuse, RN Phone Number: 03/14/2024, 9:48 AM  Clinical Narrative:     Continues on IV abx for sepsis, ID consulted. No TOC needs at this time.                     Expected Discharge Plan and Services                                               Social Drivers of Health (SDOH) Interventions SDOH Screenings   Food Insecurity: No Food Insecurity (03/13/2024)  Housing: Low Risk (03/13/2024)  Transportation Needs: No Transportation Needs (03/13/2024)  Utilities: Not At Risk (03/13/2024)  Financial Resource Strain: Low Risk  (02/15/2024)   Received from [facility]  Social Connections: Unknown (03/13/2024)  Tobacco Use: Low Risk (03/14/2024)    Readmission Risk Interventions     No data to display

## 2024-03-14 NOTE — Assessment & Plan Note (Signed)
 Continue treatment per above

## 2024-03-14 NOTE — Care Management Important Message (Signed)
 Important Message  Patient Details  Name: Judy Prince MRN: [id number] Date of Birth: [birth year]   Important Message Given:  Yes - Medicare IM     Sears Oran W, CMA 03/14/2024, 11:38 AM

## 2024-03-14 NOTE — Progress Notes (Signed)
 PROGRESS NOTE  Judy Prince  FMW:[id number] DOB: 1955/06/09 DOA: 03/11/2024 PCP: Cleotilde Oneil FALCON, MD   68 year old female with history of CAD, IBS, gastritis, fibromyalgia, history of stone came into ED complaining of abdominal pain and dysuria.  She went to primary care's office with some urinary frequency and thought to have a urinary tract infection, called PCP office and they prescribed her Macrobid for 2 days.  Patient is stated that she was compliant with the medications.  Even after 36 hours of treatment, patient did not have improvement in her urinary symptoms and decided to come to the emergency room for evaluation. She was admitted from the emergency room with UTI and sepsis.  She had temperature of 100.2.  Lactic acid was 3.  12/16: I assumed care of the patient.  Noted that blood cultures had to be reintubated for better growth at approximately 1130 today.  Continue ceftriaxone  2 g IV, day 4 of antibiotics.  Assessment & Plan:   Principal Problem:   Sepsis due to undetermined organism [facility]) Active Problems:   Acute cystitis without hematuria   Bacteremia   Dyslipidemia   Coronary artery disease   GERD without esophagitis   Assessment and Plan:  * Sepsis due to undetermined organism (HCC) Streptococcus bacteremia, grew Streptococcus mitis likely dental as patient had dental work recently Specificity/sensitivities pending Continue ceftriaxone  Dr. Epifanio with infectious disease has been consulted 12/16: 2 sets of culture has been repeated on 12/14, no growth to date.  Original culture that grew Streptococcus species has been reintubated for better growth, this was updated on 12/16 at 11:53.  Bacteremia Continue treatment per above  Acute cystitis without hematuria Continue ceftriaxone  2 g IV daily  Coronary artery disease Will continue aspirin  81 mg, Plavix  75 mg, metoprolol  tartrate 50 mg twice daily and rosuvastatin  20 mg every evening, losartan  25 mg  daily  Dyslipidemia Continue ezetimibe  10 mg daily, rosuvastatin  20 mg every evening  GERD without esophagitis Famotidine  40 mg nightly  DVT prophylaxis: Enoxaparin  Code Status: Full code Family Communication: Patient declined stating that she can update her husband Disposition Plan: Pending clinical course Level of care: Med-Surg  Consultants:  Infectious disease  Procedures:  None at this time  Antimicrobials: 12/16: Ceftriaxone  2 g IV daily, day 4  Subjective:  At bedside, patient awake alert and oriented to self, age, location, current calendar year.  Patient does not appear to be in acute distress.  Patient states she has not had any sleep since being in the hospital due to the all the commotion that happens in her room.  Patient is looking forward to going home to sleep in her bed.  Objective: Vitals:   03/13/24 1945 03/13/24 2131 03/14/24 0417 03/14/24 0816  BP: (!) 134/57 (!) 143/63 (!) 144/90 (!) 153/68  Pulse: 73 73 63 64  Resp: 16  18 16   Temp: 98.4 F (36.9 C)  97.6 F (36.4 C) 97.6 F (36.4 C)  TempSrc:   Oral Oral  SpO2: 95% 94% 94% 99%  Weight:      Height:        Intake/Output Summary (Last 24 hours) at 03/14/2024 1440 Last data filed at 03/14/2024 0951 Gross per 24 hour  Intake 900 ml  Output --  Net 900 ml   Filed Weights   03/11/24 0556  Weight: 83.2 kg   Examination:  General exam: Appears calm and comfortable  Respiratory system: Clear to auscultation. Respiratory effort normal. Cardiovascular system: S1 & S2 heard, RRR.  No JVD, murmurs, rubs, gallops or clicks. No pedal edema. Gastrointestinal system: Obese abdomen, abdomen is nondistended, soft and nontender. No organomegaly or masses felt. Normal bowel sounds heard. Central nervous system: Alert and oriented. No focal neurological deficits. Extremities: Symmetric 5 x 5 power. Skin: No rashes, lesions or ulcers Psychiatry: Judgement and insight appear normal. Mood & affect  appropriate.   Data Reviewed: I have personally reviewed following labs and imaging studies  CBC: Recent Labs  Lab 03/11/24 0106 03/11/24 0632  WBC 5.8 5.7  HGB 14.9 13.9  HCT 45.2 41.1  MCV 90.8 90.1  PLT 188 160   Basic Metabolic Panel: Recent Labs  Lab 03/11/24 0106 03/11/24 0632  NA 136 133*  K 4.6 3.9  CL 101 99  CO2 25 24  GLUCOSE 96 110*  BUN 17 13  CREATININE 0.80 0.67  CALCIUM  9.7 8.9   GFR: Estimated Creatinine Clearance: 65.4 mL/min (by C-G formula based on SCr of 0.67 mg/dL).  Liver Function Tests: Recent Labs  Lab 03/11/24 0106  AST 21  ALT 23  ALKPHOS 77  BILITOT 0.4  PROT 7.6  ALBUMIN 4.2   Recent Labs  Lab 03/11/24 0106  LIPASE 40   Coagulation Profile: Recent Labs  Lab 03/11/24 0632  INR 1.1   CBG: Recent Labs  Lab 03/12/24 1608 03/12/24 1953 03/13/24 0732 03/13/24 2000 03/14/24 0838  GLUCAP 133* 118* 96 161* 113*   Sepsis Labs: Recent Labs  Lab 03/11/24 0425  LATICACIDVEN 3.0*   Recent Results (from the past 240 hours)  Urine Culture     Status: None   Collection Time: 03/11/24  1:06 AM   Specimen: Urine, Clean Catch  Result Value Ref Range Status   Specimen Description   Final    URINE, CLEAN CATCH Performed at [facility], [address] [place] [address], [place], [place] [address]    Special Requests   Final    NONE Performed at [facility], [address], [place], [place] [address]    Culture   Final    NO GROWTH Performed at [facility], [address], [place], [place] [address]    Report Status 03/12/2024 FINAL  Final  Blood culture (routine x 2)     Status: Abnormal (Preliminary result)   Collection Time: 03/11/24  4:25 AM   Specimen: BLOOD  Result Value Ref Range Status   Specimen Description   Final    BLOOD BLOOD LEFT ARM Performed at [facility], [address] [place], [place] [address]    Special Requests   Final    BOTTLES DRAWN AEROBIC AND ANAEROBIC Blood Culture  results may not be optimal due to an inadequate volume of blood received in culture bottles Performed at [facility], [address], [place], [place] [address]    Culture  Setup Time   Final    Organism ID to follow GRAM POSITIVE COCCI ANAEROBIC BOTTLE ONLY CRITICAL RESULT CALLED TO, READ BACK BY AND VERIFIED WITH: TIFFANY GILCHRIST ON 03/11/24 AT 1704 QSD Performed at [facility], [address] [place], [place] [address]    Culture (A)  Final    STREPTOCOCCUS SPECIES CULTURE REINCUBATED FOR BETTER GROWTH Performed at [facility], [address] [place], [place] [address]    Report Status PENDING  Incomplete  Blood Culture ID Panel (Reflexed)     Status: Abnormal   Collection Time: 03/11/24  4:25 AM  Result Value Ref Range Status  Enterococcus faecalis NOT DETECTED NOT DETECTED Final   Enterococcus Faecium NOT DETECTED NOT DETECTED Final   Listeria monocytogenes NOT DETECTED NOT DETECTED Final   Staphylococcus species NOT DETECTED NOT DETECTED Final   Staphylococcus aureus (BCID) NOT DETECTED NOT DETECTED Final   Staphylococcus epidermidis NOT DETECTED NOT DETECTED Final   Staphylococcus lugdunensis NOT DETECTED NOT DETECTED Final   Streptococcus species DETECTED (A) NOT DETECTED Final    Comment: Not Enterococcus species, Streptococcus agalactiae, Streptococcus pyogenes, or Streptococcus pneumoniae. CRITICAL RESULT CALLED TO, READ BACK BY AND VERIFIED WITH: TIFFANY GILCHRIST ON 03/11/24 AT 1704 QSD    Streptococcus agalactiae NOT DETECTED NOT DETECTED Final   Streptococcus pneumoniae NOT DETECTED NOT DETECTED Final   Streptococcus pyogenes NOT DETECTED NOT DETECTED Final   A.calcoaceticus-baumannii NOT DETECTED NOT DETECTED Final   Bacteroides fragilis NOT DETECTED NOT DETECTED Final   Enterobacterales NOT DETECTED NOT DETECTED Final   Enterobacter cloacae complex NOT DETECTED NOT DETECTED Final   Escherichia coli NOT DETECTED NOT DETECTED  Final   Klebsiella aerogenes NOT DETECTED NOT DETECTED Final   Klebsiella oxytoca NOT DETECTED NOT DETECTED Final   Klebsiella pneumoniae NOT DETECTED NOT DETECTED Final   Proteus species NOT DETECTED NOT DETECTED Final   Salmonella species NOT DETECTED NOT DETECTED Final   Serratia marcescens NOT DETECTED NOT DETECTED Final   Haemophilus influenzae NOT DETECTED NOT DETECTED Final   Neisseria meningitidis NOT DETECTED NOT DETECTED Final   Pseudomonas aeruginosa NOT DETECTED NOT DETECTED Final   Stenotrophomonas maltophilia NOT DETECTED NOT DETECTED Final   Candida albicans NOT DETECTED NOT DETECTED Final   Candida auris NOT DETECTED NOT DETECTED Final   Candida glabrata NOT DETECTED NOT DETECTED Final   Candida krusei NOT DETECTED NOT DETECTED Final   Candida parapsilosis NOT DETECTED NOT DETECTED Final   Candida tropicalis NOT DETECTED NOT DETECTED Final   Cryptococcus neoformans/gattii NOT DETECTED NOT DETECTED Final    Comment: Performed at [facility], [address] [place], [place] [address]  Blood culture (routine x 2)     Status: Abnormal (Preliminary result)   Collection Time: 03/11/24  4:30 AM   Specimen: BLOOD  Result Value Ref Range Status   Specimen Description   Final    BLOOD BLOOD RIGHT ARM Performed at [facility], [address] [place], [place] [address]    Special Requests   Final    BOTTLES DRAWN AEROBIC AND ANAEROBIC Blood Culture results may not be optimal due to an inadequate volume of blood received in culture bottles Performed at [facility], [address] [place], [place] [address]    Culture  Setup Time   Final    GRAM POSITIVE COCCI IN BOTH AEROBIC AND ANAEROBIC BOTTLES CRITICAL VALUE NOTED.  VALUE IS CONSISTENT WITH PREVIOUSLY REPORTED AND CALLED VALUE. Performed at [facility], [address] [place], [place] [address]    Culture (A)  Final    STREPTOCOCCUS MITIS/ORALIS SUSCEPTIBILITIES TO  FOLLOW Performed at [facility], [address], [place], [place] [address]    Report Status PENDING  Incomplete  Culture, blood (Routine X 2) w Reflex to ID Panel     Status: None (Preliminary result)   Collection Time: 03/12/24 11:21 AM   Specimen: BLOOD LEFT HAND  Result Value Ref Range Status   Specimen Description BLOOD LEFT HAND  Final   Special Requests   Final    BOTTLES DRAWN AEROBIC AND ANAEROBIC Blood Culture adequate volume  Culture   Final    NO GROWTH 2 DAYS Performed at [facility], [address] [place], [place] [address]    Report Status PENDING  Incomplete  Culture, blood (Routine X 2) w Reflex to ID Panel     Status: None (Preliminary result)   Collection Time: 03/12/24 11:21 AM   Specimen: BLOOD RIGHT HAND  Result Value Ref Range Status   Specimen Description BLOOD RIGHT HAND  Final   Special Requests   Final    BOTTLES DRAWN AEROBIC AND ANAEROBIC Blood Culture results may not be optimal due to an inadequate volume of blood received in culture bottles   Culture   Final    NO GROWTH 2 DAYS Performed at [facility], [address] [place], [place] [address]    Report Status PENDING  Incomplete    Scheduled Meds:  aspirin  EC  81 mg Oral Daily   clopidogrel   75 mg Oral Daily   enoxaparin  (LOVENOX ) injection  40 mg Subcutaneous Q24H   ezetimibe   10 mg Oral Daily   famotidine   40 mg Oral QHS   fluticasone   2 spray Each Nare Daily   losartan   25 mg Oral Daily   metoprolol  tartrate  50 mg Oral BID   polyethylene glycol  17 g Oral Daily   rosuvastatin   20 mg Oral QPM   Continuous Infusions:  cefTRIAXone  (ROCEPHIN )  IV 2 g (03/14/24 0621)    LOS: 2 days   Time spent: 50 minutes  Dr. Sherre [facility] If 7PM-7AM, please contact night-coverage 03/14/2024, 2:40 PM

## 2024-03-14 NOTE — Progress Notes (Signed)
 INFECTIOUS DISEASE PROGRESS NOTE Date of Admission:  03/11/2024     ID: Judy Prince is a 68 y.o. female with  Strep mitis bacteremia Principal Problem:   Sepsis due to undetermined organism [facility]) Active Problems:   Dyslipidemia   Coronary artery disease   GERD without esophagitis   Acute cystitis without hematuria   Bacteremia   Subjective: No fevers. Feels well but not sleeping good.   ROS  Eleven systems are reviewed and negative except per hpi  Medications:  Antibiotics Given (last 72 hours)     Date/Time Action Medication Dose Rate   03/12/24 0523 New Bag/Given   cefTRIAXone  (ROCEPHIN ) 2 g in sodium chloride  0.9 % 100 mL IVPB 2 g 200 mL/hr   03/13/24 0615 New Bag/Given   cefTRIAXone  (ROCEPHIN ) 2 g in sodium chloride  0.9 % 100 mL IVPB 2 g 200 mL/hr   03/14/24 0621 New Bag/Given   cefTRIAXone  (ROCEPHIN ) 2 g in sodium chloride  0.9 % 100 mL IVPB 2 g 200 mL/hr       aspirin  EC  81 mg Oral Daily   clopidogrel   75 mg Oral Daily   enoxaparin  (LOVENOX ) injection  40 mg Subcutaneous Q24H   ezetimibe   10 mg Oral Daily   famotidine   40 mg Oral QHS   fluticasone   2 spray Each Nare Daily   losartan   25 mg Oral Daily   metoprolol  tartrate  50 mg Oral BID   polyethylene glycol  17 g Oral Daily   rosuvastatin   20 mg Oral QPM    Objective: Vital signs in last 24 hours: Temp:  [97.6 F (36.4 C)-98.4 F (36.9 C)] 97.6 F (36.4 C) (12/16 0816) Pulse Rate:  [63-73] 64 (12/16 0816) Resp:  [16-18] 16 (12/16 0816) BP: (134-153)/(57-90) 153/68 (12/16 0816) SpO2:  [94 %-99 %] 99 % (12/16 0816) Constitutional:  oriented to person, place, and time. appears well-developed and well-nourished. No distress.  HENT: [place]/AT, PERRLA, no scleral icterus Mouth/Throat: Oropharynx is clear and moist. No oropharyngeal exudate. L lwoer molar tooth with cap but no drainage, tenderness Cardiovascular: Normal rate, regular rhythm and normal heart sounds. Exam reveals no gallop and no friction rub.   Pulmonary/Chest: Effort normal and breath sounds normal. No respiratory distress.  has no wheezes.  Neck = supple, no nuchal rigidity Abdominal: Soft. Bowel sounds are normal.  exhibits no distension. There is no tenderness.  Lymphadenopathy: no cervical adenopathy. No axillary adenopathy Neurological: alert and oriented to person, place, and time.  Skin: Skin is warm and dry. No rash noted. No erythema.  Psychiatric: a normal mood and affect.  behavior is normal.    Lab Results No results for input(s): WBC, HGB, HCT, NA, K, CL, CO2, BUN, CREATININE, GLU in the last 72 hours.  Invalid input(s): PLATELETS  Microbiology: Results for orders placed or performed during the hospital encounter of 03/11/24  Urine Culture     Status: None   Collection Time: 03/11/24  1:06 AM   Specimen: Urine, Clean Catch  Result Value Ref Range Status   Specimen Description   Final    URINE, CLEAN CATCH Performed at [facility], [address] [place], [place] [address]    Special Requests   Final    NONE Performed at [facility], [address] [place], [place] [address]    Culture   Final    NO GROWTH Performed at [facility], [address], [place], [place] [address]    Report Status  03/12/2024 FINAL  Final  Blood culture (routine x 2)     Status: Abnormal (Preliminary result)   Collection Time: 03/11/24  4:25 AM   Specimen: BLOOD  Result Value Ref Range Status   Specimen Description   Final    BLOOD BLOOD LEFT ARM Performed at [facility], [address] [place], [place] [address]    Special Requests   Final    BOTTLES DRAWN AEROBIC AND ANAEROBIC Blood Culture results may not be optimal due to an inadequate volume of blood received in culture bottles Performed at [facility], [address] [place], [place] [address]    Culture  Setup Time   Final    Organism ID to follow GRAM POSITIVE COCCI ANAEROBIC BOTTLE  ONLY CRITICAL RESULT CALLED TO, READ BACK BY AND VERIFIED WITH: TIFFANY GILCHRIST ON 03/11/24 AT 1704 QSD Performed at [facility], [address], [place], [place] [address]    Culture (A)  Final    STREPTOCOCCUS SPECIES CULTURE REINCUBATED FOR BETTER GROWTH Performed at [facility], [address], [place], [place] [address]    Report Status PENDING  Incomplete  Blood Culture ID Panel (Reflexed)     Status: Abnormal   Collection Time: 03/11/24  4:25 AM  Result Value Ref Range Status   Enterococcus faecalis NOT DETECTED NOT DETECTED Final   Enterococcus Faecium NOT DETECTED NOT DETECTED Final   Listeria monocytogenes NOT DETECTED NOT DETECTED Final   Staphylococcus species NOT DETECTED NOT DETECTED Final   Staphylococcus aureus (BCID) NOT DETECTED NOT DETECTED Final   Staphylococcus epidermidis NOT DETECTED NOT DETECTED Final   Staphylococcus lugdunensis NOT DETECTED NOT DETECTED Final   Streptococcus species DETECTED (A) NOT DETECTED Final    Comment: Not Enterococcus species, Streptococcus agalactiae, Streptococcus pyogenes, or Streptococcus pneumoniae. CRITICAL RESULT CALLED TO, READ BACK BY AND VERIFIED WITH: TIFFANY GILCHRIST ON 03/11/24 AT 1704 QSD    Streptococcus agalactiae NOT DETECTED NOT DETECTED Final   Streptococcus pneumoniae NOT DETECTED NOT DETECTED Final   Streptococcus pyogenes NOT DETECTED NOT DETECTED Final   A.calcoaceticus-baumannii NOT DETECTED NOT DETECTED Final   Bacteroides fragilis NOT DETECTED NOT DETECTED Final   Enterobacterales NOT DETECTED NOT DETECTED Final   Enterobacter cloacae complex NOT DETECTED NOT DETECTED Final   Escherichia coli NOT DETECTED NOT DETECTED Final   Klebsiella aerogenes NOT DETECTED NOT DETECTED Final   Klebsiella oxytoca NOT DETECTED NOT DETECTED Final   Klebsiella pneumoniae NOT DETECTED NOT DETECTED Final   Proteus species NOT DETECTED NOT DETECTED Final   Salmonella species NOT DETECTED NOT DETECTED  Final   Serratia marcescens NOT DETECTED NOT DETECTED Final   Haemophilus influenzae NOT DETECTED NOT DETECTED Final   Neisseria meningitidis NOT DETECTED NOT DETECTED Final   Pseudomonas aeruginosa NOT DETECTED NOT DETECTED Final   Stenotrophomonas maltophilia NOT DETECTED NOT DETECTED Final   Candida albicans NOT DETECTED NOT DETECTED Final   Candida auris NOT DETECTED NOT DETECTED Final   Candida glabrata NOT DETECTED NOT DETECTED Final   Candida krusei NOT DETECTED NOT DETECTED Final   Candida parapsilosis NOT DETECTED NOT DETECTED Final   Candida tropicalis NOT DETECTED NOT DETECTED Final   Cryptococcus neoformans/gattii NOT DETECTED NOT DETECTED Final    Comment: Performed at [facility], [address] [place], [place] [address]  Blood culture (routine x 2)     Status: Abnormal (Preliminary result)   Collection Time: 03/11/24  4:30 AM   Specimen: BLOOD  Result Value  Ref Range Status   Specimen Description   Final    BLOOD BLOOD RIGHT ARM Performed at [facility], [address] [place], [place] [address]    Special Requests   Final    BOTTLES DRAWN AEROBIC AND ANAEROBIC Blood Culture results may not be optimal due to an inadequate volume of blood received in culture bottles Performed at [facility], [address] [place], [place] [address]    Culture  Setup Time   Final    GRAM POSITIVE COCCI IN BOTH AEROBIC AND ANAEROBIC BOTTLES CRITICAL VALUE NOTED.  VALUE IS CONSISTENT WITH PREVIOUSLY REPORTED AND CALLED VALUE. Performed at [facility], [address] [place], [place] [address]    Culture STREPTOCOCCUS MITIS/ORALIS (A)  Final   Report Status PENDING  Incomplete   Organism ID, Bacteria STREPTOCOCCUS MITIS/ORALIS  Final      Susceptibility   Streptococcus mitis/oralis - MIC*    PENICILLIN <=0.06 SENSITIVE Sensitive     CEFTRIAXONE  <=0.12 SENSITIVE Sensitive     LEVOFLOXACIN  1 SENSITIVE Sensitive     VANCOMYCIN 0.5  SENSITIVE Sensitive     * STREPTOCOCCUS MITIS/ORALIS  Culture, blood (Routine X 2) w Reflex to ID Panel     Status: None (Preliminary result)   Collection Time: 03/12/24 11:21 AM   Specimen: BLOOD LEFT HAND  Result Value Ref Range Status   Specimen Description BLOOD LEFT HAND  Final   Special Requests   Final    BOTTLES DRAWN AEROBIC AND ANAEROBIC Blood Culture adequate volume   Culture   Final    NO GROWTH 2 DAYS Performed at [facility], [address] [place], [place] [address]    Report Status PENDING  Incomplete  Culture, blood (Routine X 2) w Reflex to ID Panel     Status: None (Preliminary result)   Collection Time: 03/12/24 11:21 AM   Specimen: BLOOD RIGHT HAND  Result Value Ref Range Status   Specimen Description BLOOD RIGHT HAND  Final   Special Requests   Final    BOTTLES DRAWN AEROBIC AND ANAEROBIC Blood Culture results may not be optimal due to an inadequate volume of blood received in culture bottles   Culture   Final    NO GROWTH 2 DAYS Performed at [facility], [address], [place], [place] [address]    Report Status PENDING  Incomplete    Studies/Results: ECHOCARDIOGRAM COMPLETE Result Date: 03/12/2024    ECHOCARDIOGRAM REPORT   Patient Name:   ZUMA HUST Date of Exam: 03/12/2024 Medical Rec #:  [id number]      Height:       62.0 in Accession #:    [id number]     Weight:       183.4 lb Date of Birth:  October 28, 1955       BSA:          1.843 m Patient Age:    68 years       BP:           147/80 mmHg Patient Gender: F              HR:           60 bpm. Exam Location:  [facility] Procedure: 2D Echo, 3D Echo, Color Doppler, Cardiac Doppler and Strain Analysis            (Both Spectral and Color Flow Doppler were utilized during  procedure). Indications:     Bacteremia  History:         Patient has prior history of Echocardiogram examinations, most                  recent 08/08/2020. CAD, Signs/Symptoms:Chest Pain and Murmur;                   Risk Factors:Hypertension, Dyslipidemia and Sleep Apnea.  Sonographer:     Logan Shove RDCS Referring Phys:  [id number] [facility] PAUDEL Diagnosing Phys: Shelda Bruckner MD IMPRESSIONS  1. Left ventricular ejection fraction, by estimation, is 65 to 70%. The left ventricle has normal function. The left ventricle has no regional wall motion abnormalities. Left ventricular diastolic parameters are consistent with Grade I diastolic dysfunction (impaired relaxation). The average left ventricular global longitudinal strain is -20.4 %. The global longitudinal strain is normal.  2. Right ventricular systolic function is normal. The right ventricular size is normal.  3. The mitral valve is grossly normal. No evidence of mitral valve regurgitation. No evidence of mitral stenosis.  4. The aortic valve is tricuspid. There is mild calcification of the aortic valve. Aortic valve regurgitation is not visualized. No aortic stenosis is present.  5. The inferior vena cava is normal in size with greater than 50% respiratory variability, suggesting right atrial pressure of 3 mmHg. Comparison(s): No significant change from prior study. Conclusion(s)/Recommendation(s): Normal biventricular function without evidence of hemodynamically significant valvular heart disease. No evidence of valvular vegetations on this transthoracic echocardiogram. Consider a transesophageal echocardiogram to exclude infective endocarditis if clinically indicated. FINDINGS  Left Ventricle: Left ventricular ejection fraction, by estimation, is 65 to 70%. The left ventricle has normal function. The left ventricle has no regional wall motion abnormalities. The average left ventricular global longitudinal strain is -20.4 %. Strain was performed and the global longitudinal strain is normal. The left ventricular internal cavity size was normal in size. There is no left ventricular hypertrophy. Left ventricular diastolic parameters are consistent with Grade I diastolic  dysfunction (impaired relaxation). Right Ventricle: The right ventricular size is normal. Right vetricular wall thickness was not well visualized. Right ventricular systolic function is normal. Left Atrium: Left atrial size was normal in size. Right Atrium: Right atrial size was normal in size. Pericardium: There is no evidence of pericardial effusion. Presence of epicardial fat layer. Mitral Valve: The mitral valve is grossly normal. Mild mitral annular calcification. No evidence of mitral valve regurgitation. No evidence of mitral valve stenosis. Tricuspid Valve: The tricuspid valve is grossly normal. Tricuspid valve regurgitation is trivial. No evidence of tricuspid stenosis. Aortic Valve: The aortic valve is tricuspid. There is mild calcification of the aortic valve. Aortic valve regurgitation is not visualized. No aortic stenosis is present. Aortic valve peak gradient measures 8.0 mmHg. Pulmonic Valve: The pulmonic valve was not well visualized. Pulmonic valve regurgitation is trivial. No evidence of pulmonic stenosis. Aorta: The aortic root, ascending aorta and aortic arch are all structurally normal, with no evidence of dilitation or obstruction. Venous: The inferior vena cava is normal in size with greater than 50% respiratory variability, suggesting right atrial pressure of 3 mmHg. IAS/Shunts: The atrial septum is grossly normal. Additional Comments: 3D was performed not requiring image post processing on an independent workstation and was normal.  LEFT VENTRICLE PLAX 2D LVIDd:         4.10 cm   Diastology LVIDs:         2.60 cm   LV e' medial:  6.31 cm/s LV PW:         0.70 cm   LV E/e' medial:  10.5 LV IVS:        0.90 cm   LV e' lateral:   7.62 cm/s LVOT diam:     1.90 cm   LV E/e' lateral: 8.7 LVOT Area:     2.84 cm                          2D Longitudinal Strain                          2D Strain GLS (A4C):   -20.9 %                          2D Strain GLS (A3C):   -20.4 %                          2D  Strain GLS (A2C):   -20.0 %                          2D Strain GLS Avg:     -20.4 %                           3D Volume EF:                          3D EF:        61 %                          LV EDV:       95 ml                          LV ESV:       37 ml                          LV SV:        57 ml RIGHT VENTRICLE            IVC RV Basal diam:  2.70 cm    IVC diam: 1.50 cm RV S prime:     8.27 cm/s TAPSE (M-mode): 2.1 cm LEFT ATRIUM             Index        RIGHT ATRIUM           Index LA diam:        3.90 cm 2.12 cm/m   RA Area:     13.80 cm LA Vol (A2C):   46.3 ml 25.13 ml/m  RA Volume:   28.10 ml  15.25 ml/m LA Vol (A4C):   36.5 ml 19.81 ml/m LA Biplane Vol: 42.7 ml 23.17 ml/m  AORTIC VALVE AV Area (Vmax): 2.11 cm AV Vmax:        141.00 cm/s AV Peak Grad:   8.0 mmHg LVOT Vmax:      105.00 cm/s  AORTA Ao Root diam: 2.80 cm Ao Asc diam:  2.80 cm MITRAL VALVE MV Area (PHT): 2.24 cm     SHUNTS MV Decel Time: 338 msec     Systemic  Diam: 1.90 cm MV E velocity: 66.00 cm/s MV A velocity: 101.00 cm/s MV E/A ratio:  0.65 Shelda Bruckner MD Electronically signed by Shelda Bruckner MD Signature Date/Time: 03/12/2024/2:59:01 PM    Final     Assessment/Plan: CAITLAND PORCHIA is a 68 y.o. female with a history of anxiety arthritis coronary artery disease status post stenting diastolic dysfunction GERD hypertension hyperlipidemia IBS sleep apnea admitted with dysuria urinary frequency lower abdominal pain chills weakness and nausea with vomiting.  She had previously just started an antibiotic for possible UTI.  Was found to have strep mitis bacteremia in 2 of 2 sets.  She also had CT findings with possible inflammation in her bladder although urinalysis on admission only showed 0-5 white cells and urine cultures no growth. Strep mitis is a common cause of subacute bacterial endocarditis usually from an oral source.  This seems relatively acute since her dental work was only about 2 weeks ago.  Follow-up  blood cultures are pending. Echocardiogram transthoracic shows no vegetations, mild calcification of the aortic valve but otherwise normal   12/16- strep mitis sensis available but now second set of culture is growing a different strep species.  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   Could consider daily IM ceftriaxone  at [facility] if assists with dc planning. I can arrange this.  Will need followup BCX 1 week after stopping abx and fu with me in 2-3 weeks   Thank you very much for the consult. Will follow with you.  Alm SHAUNNA Needle   03/14/2024, 9:43 AM

## 2024-03-14 NOTE — Assessment & Plan Note (Signed)
 Continue ceftriaxone  2 g IV daily

## 2024-03-14 NOTE — Progress Notes (Signed)
 Mobility Specialist Progress Note:    03/14/24 1017  Mobility  Activity Ambulated independently  Level of Assistance Independent  Assistive Device None  Distance Ambulated (ft) 350 ft  Range of Motion/Exercises Active;All extremities  Activity Response Tolerated well  Mobility visit 1 Mobility  Mobility Specialist Start Time (ACUTE ONLY) 1005  Mobility Specialist Stop Time (ACUTE ONLY) 1017  Mobility Specialist Time Calculation (min) (ACUTE ONLY) 12 min   Pt received sitting EOB, agreeable to mobility. Independently able to stand and ambulate with no AD. Tolerated well, c/o ongoing LLE stiffness from knee replacement 3 years ago. Returned to room, left pt sitting EOB. All needs met.  Judy Prince Mobility Specialist Please contact via Special Educational Needs Teacher or  Rehab office at [phone]

## 2024-03-15 ENCOUNTER — Inpatient Hospital Stay: Admitting: Anesthesiology

## 2024-03-15 ENCOUNTER — Inpatient Hospital Stay
Admit: 2024-03-15 | Discharge: 2024-03-15 | Disposition: A | Discharge status: Home / Self Care | Attending: Cardiovascular Disease | Admitting: Cardiovascular Disease

## 2024-03-15 ENCOUNTER — Encounter
Admission: EM | Disposition: A | Discharge status: Home / Self Care | Payer: Self-pay | Source: Home / Self Care | Attending: Hospitalist

## 2024-03-15 DIAGNOSIS — I330 Acute and subacute infective endocarditis: Secondary | ICD-10-CM | POA: Insufficient documentation

## 2024-03-15 DIAGNOSIS — R7881 Bacteremia: Secondary | ICD-10-CM | POA: Diagnosis not present

## 2024-03-15 DIAGNOSIS — N3000 Acute cystitis without hematuria: Secondary | ICD-10-CM | POA: Diagnosis not present

## 2024-03-15 HISTORY — PX: TEE WITHOUT CARDIOVERSION: SHX5443

## 2024-03-15 LAB — CULTURE, BLOOD (ROUTINE X 2)

## 2024-03-15 LAB — ECHO TEE

## 2024-03-15 MED ORDER — SODIUM CHLORIDE 0.9 % IV SOLN: INTRAVENOUS | Status: DC

## 2024-03-15 MED ORDER — AMOXICILLIN 500 MG PO TABS: 1000.0000 mg | ORAL_TABLET | Freq: Three times a day (TID) | ORAL | 0 refills | Status: AC

## 2024-03-15 MED ORDER — LIDOCAINE VISCOUS HCL 2 % MT SOLN
15.0000 mL | Freq: Once | OROMUCOSAL | Status: AC
  Administered 2024-03-15: 14:00:00 15 mL via OROMUCOSAL
  Filled 2024-03-15: qty 15

## 2024-03-15 MED ORDER — LEVOFLOXACIN 250 MG PO TABS: 500.0000 mg | ORAL_TABLET | Freq: Every day | ORAL | 0 refills | Status: AC

## 2024-03-15 MED ORDER — BUTAMBEN-TETRACAINE-BENZOCAINE 2-2-14 % EX AERO
INHALATION_SPRAY | CUTANEOUS | Status: AC
  Filled 2024-03-15: qty 5

## 2024-03-15 MED ORDER — PROPOFOL 10 MG/ML IV BOLUS
INTRAVENOUS | Status: DC | PRN
  Administered 2024-03-15: 13:00:00 20 mg via INTRAVENOUS
  Administered 2024-03-15: 13:00:00 10 mg via INTRAVENOUS
  Administered 2024-03-15: 13:00:00 20 mg via INTRAVENOUS
  Administered 2024-03-15: 13:00:00 50 mg via INTRAVENOUS

## 2024-03-15 MED ORDER — BUTAMBEN-TETRACAINE-BENZOCAINE 2-2-14 % EX AERO
1.0000 | INHALATION_SPRAY | Freq: Once | CUTANEOUS | Status: AC
  Administered 2024-03-15: 14:00:00 1 via TOPICAL
  Filled 2024-03-15: qty 0.3

## 2024-03-15 MED ORDER — LIDOCAINE VISCOUS HCL 2 % MT SOLN
OROMUCOSAL | Status: AC
  Filled 2024-03-15: qty 15

## 2024-03-15 NOTE — Progress Notes (Addendum)
°  Per mobility note from 12/16 and chart, patient is ambulating independently > 350 feet without AD. No skilled needs identified at this time. Screen only. Signing off.    Westly Hinnant, PT, GCS 03/15/2024,2:34 PM

## 2024-03-15 NOTE — Transfer of Care (Signed)
 Immediate Anesthesia Transfer of Care Note  Patient: Judy Prince  Procedure(s) Performed: ECHOCARDIOGRAM, TRANSESOPHAGEAL  Patient Location: PACU and special recoveries  Anesthesia Type:General  Level of Consciousness: drowsy and patient cooperative  Airway & Oxygen Therapy: Patient Spontanous Breathing and Patient connected to nasal cannula oxygen  Post-op Assessment: Report given to RN and Post -op Vital signs reviewed and stable  Post vital signs: Reviewed and stable  Last Vitals:  Vitals Value Taken Time  BP 125/67 03/15/24 13:24  Temp    Pulse 64 03/15/24 13:27  Resp 20 03/15/24 13:27  SpO2 97 % 03/15/24 13:27    Last Pain:  Vitals:   03/15/24 1227  TempSrc: Oral  PainSc: 4          Complications: No notable events documented.

## 2024-03-15 NOTE — Progress Notes (Signed)
 INFECTIOUS DISEASE PROGRESS NOTE Date of Admission:  03/11/2024     ID: Judy Prince is a 68 y.o. female with  Strep mitis bacteremia Principal Problem:   Sepsis due to urinary tract infection (HCC) Active Problems:   Hyperlipidemia   Dyslipidemia   Coronary artery disease   GERD without esophagitis   Acute cystitis without hematuria   Bacteremia   Acute bacterial endocarditis   Subjective: No fevers. Feels well but not sleeping good.   ROS  Eleven systems are reviewed and negative except per hpi  Medications:  Antibiotics Given (last 72 hours)     Date/Time Action Medication Dose Rate   03/13/24 0615 New Bag/Given   [MAR Hold] cefTRIAXone  (ROCEPHIN ) 2 g in sodium chloride  0.9 % 100 mL IVPB MAR Hold since Wed 03/15/2024 at 1221.Hold Reason: Transfer to a Procedural area 2 g 200 mL/hr   03/14/24 0621 New Bag/Given   [MAR Hold] cefTRIAXone  (ROCEPHIN ) 2 g in sodium chloride  0.9 % 100 mL IVPB MAR Hold since Wed 03/15/2024 at 1221.Hold Reason: Transfer to a Procedural area 2 g 200 mL/hr   03/15/24 0536 New Bag/Given   [MAR Hold] cefTRIAXone  (ROCEPHIN ) 2 g in sodium chloride  0.9 % 100 mL IVPB MAR Hold since Wed 03/15/2024 at 1221.Hold Reason: Transfer to a Procedural area 2 g 200 mL/hr       [MAR Hold] aspirin  EC  81 mg Oral Daily   [MAR Hold] clopidogrel   75 mg Oral Daily   [MAR Hold] enoxaparin  (LOVENOX ) injection  40 mg Subcutaneous Q24H   [MAR Hold] ezetimibe   10 mg Oral Daily   [MAR Hold] famotidine   40 mg Oral QHS   [MAR Hold] fluticasone   2 spray Each Nare Daily   [MAR Hold] losartan   25 mg Oral Daily   [MAR Hold] metoprolol  tartrate  50 mg Oral BID   [MAR Hold] polyethylene glycol  17 g Oral Daily   [MAR Hold] rosuvastatin   20 mg Oral QPM    Objective: Vital signs in last 24 hours: Temp:  [97.1 F (36.2 C)-98.5 F (36.9 C)] 97.1 F (36.2 C) (12/17 1356) Pulse Rate:  [59-79] 60 (12/17 1356) Resp:  [12-21] 17 (12/17 1356) BP: (113-219)/(51-205) 128/66 (12/17  1356) SpO2:  [93 %-99 %] 94 % (12/17 1356) Constitutional:  oriented to person, place, and time. appears well-developed and well-nourished. No distress.  HENT: Stollings/AT, PERRLA, no scleral icterus Mouth/Throat: Oropharynx is clear and moist. No oropharyngeal exudate. L lwoer molar tooth with cap but no drainage, tenderness Cardiovascular: Normal rate, regular rhythm and normal heart sounds. Exam reveals no gallop and no friction rub.  Pulmonary/Chest: Effort normal and breath sounds normal. No respiratory distress.  has no wheezes.  Neck = supple, no nuchal rigidity Abdominal: Soft. Bowel sounds are normal.  exhibits no distension. There is no tenderness.  Lymphadenopathy: no cervical adenopathy. No axillary adenopathy Neurological: alert and oriented to person, place, and time.  Skin: Skin is warm and dry. No rash noted. No erythema.  Psychiatric: a normal mood and affect.  behavior is normal.    Lab Results No results for input(s): WBC, HGB, HCT, NA, K, CL, CO2, BUN, CREATININE, GLU in the last 72 hours.  Invalid input(s): PLATELETS  Microbiology: Results for orders placed or performed during the hospital encounter of 03/11/24  Urine Culture     Status: None   Collection Time: 03/11/24  1:06 AM   Specimen: Urine, Clean Catch  Result Value Ref Range Status   Specimen Description  Final    URINE, CLEAN CATCH Performed at [facility], [address], [place], [place] [address]    Special Requests   Final    NONE Performed at [facility], [address] [place], [place] [address]    Culture   Final    NO GROWTH Performed at [facility], [address], [place], [place] [address]    Report Status 03/12/2024 FINAL  Final  Blood culture (routine x 2)     Status: Abnormal   Collection Time: 03/11/24  4:25 AM   Specimen: BLOOD  Result Value Ref Range Status   Specimen Description   Final    BLOOD BLOOD LEFT ARM Performed at  [facility], [address], [place], [place] [address]    Special Requests   Final    BOTTLES DRAWN AEROBIC AND ANAEROBIC Blood Culture results may not be optimal due to an inadequate volume of blood received in culture bottles Performed at [facility], [address] [place], [place] [address]    Culture  Setup Time   Final    Organism ID to follow GRAM POSITIVE COCCI ANAEROBIC BOTTLE ONLY CRITICAL RESULT CALLED TO, READ BACK BY AND VERIFIED WITH: TIFFANY GILCHRIST ON 03/11/24 AT 1704 QSD Performed at [facility], [address] [place], [place] [address]    Culture (A)  Final    STREPTOCOCCUS SALIVARIUS STREPTOCOCCUS MITIS/ORALIS SUSCEPTIBILITIES PERFORMED ON PREVIOUS CULTURE WITHIN THE LAST 5 DAYS. Performed at [facility], [address] [place], [place] [address]    Report Status 03/15/2024 FINAL  Final   Organism ID, Bacteria STREPTOCOCCUS SALIVARIUS  Final      Susceptibility   Streptococcus salivarius - MIC*    PENICILLIN 0.25 INTERMEDIATE Intermediate     CEFTRIAXONE  0.25 SENSITIVE Sensitive     ERYTHROMYCIN >=8 RESISTANT Resistant     LEVOFLOXACIN  2 SENSITIVE Sensitive     VANCOMYCIN 0.5 SENSITIVE Sensitive     * STREPTOCOCCUS SALIVARIUS  Blood Culture ID Panel (Reflexed)     Status: Abnormal   Collection Time: 03/11/24  4:25 AM  Result Value Ref Range Status   Enterococcus faecalis NOT DETECTED NOT DETECTED Final   Enterococcus Faecium NOT DETECTED NOT DETECTED Final   Listeria monocytogenes NOT DETECTED NOT DETECTED Final   Staphylococcus species NOT DETECTED NOT DETECTED Final   Staphylococcus aureus (BCID) NOT DETECTED NOT DETECTED Final   Staphylococcus epidermidis NOT DETECTED NOT DETECTED Final   Staphylococcus lugdunensis NOT DETECTED NOT DETECTED Final   Streptococcus species DETECTED (A) NOT DETECTED Final    Comment: Not Enterococcus species, Streptococcus agalactiae, Streptococcus pyogenes, or Streptococcus  pneumoniae. CRITICAL RESULT CALLED TO, READ BACK BY AND VERIFIED WITH: TIFFANY GILCHRIST ON 03/11/24 AT 1704 QSD    Streptococcus agalactiae NOT DETECTED NOT DETECTED Final   Streptococcus pneumoniae NOT DETECTED NOT DETECTED Final   Streptococcus pyogenes NOT DETECTED NOT DETECTED Final   A.calcoaceticus-baumannii NOT DETECTED NOT DETECTED Final   Bacteroides fragilis NOT DETECTED NOT DETECTED Final   Enterobacterales NOT DETECTED NOT DETECTED Final   Enterobacter cloacae complex NOT DETECTED NOT DETECTED Final   Escherichia coli NOT DETECTED NOT DETECTED Final   Klebsiella aerogenes NOT DETECTED NOT DETECTED Final   Klebsiella oxytoca NOT DETECTED NOT DETECTED Final   Klebsiella pneumoniae NOT DETECTED NOT DETECTED Final   Proteus species NOT DETECTED NOT DETECTED Final   Salmonella species NOT DETECTED NOT DETECTED Final   Serratia marcescens NOT DETECTED NOT  DETECTED Final   Haemophilus influenzae NOT DETECTED NOT DETECTED Final   Neisseria meningitidis NOT DETECTED NOT DETECTED Final   Pseudomonas aeruginosa NOT DETECTED NOT DETECTED Final   Stenotrophomonas maltophilia NOT DETECTED NOT DETECTED Final   Candida albicans NOT DETECTED NOT DETECTED Final   Candida auris NOT DETECTED NOT DETECTED Final   Candida glabrata NOT DETECTED NOT DETECTED Final   Candida krusei NOT DETECTED NOT DETECTED Final   Candida parapsilosis NOT DETECTED NOT DETECTED Final   Candida tropicalis NOT DETECTED NOT DETECTED Final   Cryptococcus neoformans/gattii NOT DETECTED NOT DETECTED Final    Comment: Performed at [facility], [address] [place], [place] [address]  Blood culture (routine x 2)     Status: Abnormal   Collection Time: 03/11/24  4:30 AM   Specimen: BLOOD  Result Value Ref Range Status   Specimen Description   Final    BLOOD BLOOD RIGHT ARM Performed at [facility], [address] [place], [place] [address]    Special Requests   Final    BOTTLES DRAWN  AEROBIC AND ANAEROBIC Blood Culture results may not be optimal due to an inadequate volume of blood received in culture bottles Performed at [facility], [address] [place], [place] [address]    Culture  Setup Time   Final    GRAM POSITIVE COCCI IN BOTH AEROBIC AND ANAEROBIC BOTTLES CRITICAL VALUE NOTED.  VALUE IS CONSISTENT WITH PREVIOUSLY REPORTED AND CALLED VALUE. Performed at [facility], [address] [place], [place] [address]    Culture (A)  Final    STREPTOCOCCUS MITIS/ORALIS GEMELLA HAEMOLYSANS Standardized susceptibility testing for this organism is not available. Performed at [facility], [address] [place], [place] [address]    Report Status 03/15/2024 FINAL  Final   Organism ID, Bacteria STREPTOCOCCUS MITIS/ORALIS  Final      Susceptibility   Streptococcus mitis/oralis - MIC*    PENICILLIN <=0.06 SENSITIVE Sensitive     CEFTRIAXONE  <=0.12 SENSITIVE Sensitive     LEVOFLOXACIN  1 SENSITIVE Sensitive     VANCOMYCIN 0.5 SENSITIVE Sensitive     * STREPTOCOCCUS MITIS/ORALIS  Culture, blood (Routine X 2) w Reflex to ID Panel     Status: None (Preliminary result)   Collection Time: 03/12/24 11:21 AM   Specimen: BLOOD LEFT HAND  Result Value Ref Range Status   Specimen Description BLOOD LEFT HAND  Final   Special Requests   Final    BOTTLES DRAWN AEROBIC AND ANAEROBIC Blood Culture adequate volume   Culture   Final    NO GROWTH 3 DAYS Performed at [facility], [address] [place], [place] [address]    Report Status PENDING  Incomplete  Culture, blood (Routine X 2) w Reflex to ID Panel     Status: None (Preliminary result)   Collection Time: 03/12/24 11:21 AM   Specimen: BLOOD RIGHT HAND  Result Value Ref Range Status   Specimen Description BLOOD RIGHT HAND  Final   Special Requests   Final    BOTTLES DRAWN AEROBIC AND ANAEROBIC Blood Culture results may not be optimal due to an inadequate volume of blood received in  culture bottles   Culture   Final    NO GROWTH 3 DAYS Performed at [facility], [address], [place], [place] [address]    Report Status PENDING  Incomplete    Studies/Results: No results found.   Assessment/Plan: Judy Prince is a 68  y.o. female with a history of anxiety arthritis coronary artery disease status post stenting diastolic dysfunction GERD hypertension hyperlipidemia IBS sleep apnea admitted with dysuria urinary frequency lower abdominal pain chills weakness and nausea with vomiting.  She had previously just started an antibiotic for possible UTI.  Was found to have strep mitis bacteremia in 1 of 2 sets.  She also had CT findings with possible inflammation in her bladder although urinalysis on admission only showed 0-5 white cells and urine cultures no growth. Strep mitis is a common cause of subacute bacterial endocarditis usually from an oral source.  This seems relatively acute since her dental work was only about 2 weeks ago.  Follow-up blood cultures are pending. Echocardiogram transthoracic shows no vegetations, mild calcification of the aortic valve but otherwise normal   12/16- strep mitis sensis available but now second set of culture is growing a different strep species.  12/17 cxs now with strep mitis/oralis and Gemella in one set and strep salivarus and mitin in second set. TEE negative for vegetation.   Recommendations Would rec levofloxacin  500 mg and amoxicillin  1000 tid for another 7 days.  I can see in clinic next week and we can repeat bcx a week off  Will need followup BCX 1 week after stopping abx and fu with me in 2-3 weeks   Thank you very much for the consult. Will follow with you.  Alm SHAUNNA Needle   03/15/2024, 2:09 PM

## 2024-03-15 NOTE — Anesthesia Postprocedure Evaluation (Signed)
 Anesthesia Post Note  Patient: Judy Prince  Procedure(s) Performed: ECHOCARDIOGRAM, TRANSESOPHAGEAL  Patient location during evaluation: Specials Recovery Anesthesia Type: General Level of consciousness: awake and alert Pain management: pain level controlled Vital Signs Assessment: post-procedure vital signs reviewed and stable Respiratory status: spontaneous breathing, nonlabored ventilation, respiratory function stable and patient connected to nasal cannula oxygen Cardiovascular status: blood pressure returned to baseline and stable Postop Assessment: no apparent nausea or vomiting Anesthetic complications: no   No notable events documented.   Last Vitals:  Vitals:   03/15/24 1345 03/15/24 1356  BP: (!) 114/51 128/66  Pulse: 64 60  Resp: 18 17  Temp:  (!) 36.2 C  SpO2: 94% 94%    Last Pain:  Vitals:   03/15/24 1356  TempSrc: Oral  PainSc: 0-No pain                 Fairy POUR Edson Deridder

## 2024-03-15 NOTE — Progress Notes (Signed)
 Transesophageal Echocardiogram :  Indication: Bacteremia, rule out endocarditis Requesting/ordering  physician: Dr. Epifanio  Procedure: Benzocaine  spray x2 and 2 mls x 2 of viscous lidocaine  were given orally to provide local anesthesia to the oropharynx. The patient was positioned supine on the left side, bite block provided. The patient was moderately sedated with the doses of versed  and fentanyl  as detailed below.  Using digital technique an omniplane probe was advanced into the distal esophagus without incident.   Moderate sedation: 1. Sedation used: Sedation per anesthesia   See report in EPIC  for complete details: In brief,  No valve vegetation noted  transgastric imaging revealed normal LV function with no RWMAs and no mural apical thrombus.  .  Estimated ejection fraction was 55%.  Right sided cardiac chambers were normal with no evidence of pulmonary hypertension.  Imaging of the septum showed no ASD or VSD Bubble study was negative for shunt 2D and color flow confirmed no PFO  The LA was well visualized in orthogonal views.  There was no spontaneous contrast and no thrombus in the LA and LA appendage   The descending thoracic aorta had no  mural aortic debris with no evidence of aneurysmal dilation or dissection - Mild descending aorta atherosclerosis  Judy Prince 03/15/2024 1:53 PM

## 2024-03-15 NOTE — Anesthesia Procedure Notes (Signed)
 Procedure Name: MAC Date/Time: 03/15/2024 1:09 PM  Performed by: Lacretia Camelia NOVAK, CRNAPre-anesthesia Checklist: Patient identified, Emergency Drugs available, Suction available and Patient being monitored Patient Re-evaluated:Patient Re-evaluated prior to induction Oxygen Delivery Method: Nasal cannula

## 2024-03-15 NOTE — Progress Notes (Signed)
° ° °  PROCEDURAL EXPEDITER PROGRESS NOTE  Patient Name: Judy Prince  DOB:03/23/1956 Date of Admission: 03/11/2024  Date of Assessment:03/15/2024   -------------------------------------------------------------------------------------------------------------------   Brief clinical summary: pt is a 68 yr old female.  With Hx of CAD, status post PCI and 3 stents, diastolic dysfunction , GERD, HTN, and schedule for TEE 03/15/2024  Orders in place:  Yes   Communication with surgical team if no orders: no  Labs, test, and orders reviewed: yes  Requires surgical clearance:  No  What type of clearance: n/a  Clearance received: n/a  Barriers noted:n/a   Intervention provided by Judy Prince team: n/a  Barrier resolved:  not applicable   -------------------------------------------------------------------------------------------------------------------  Judy Prince, Judy Prince Please contact us  directly via secure chat (search for Judy Prince) or by calling us  at [phone] Judy Prince).

## 2024-03-15 NOTE — Anesthesia Preprocedure Evaluation (Signed)
 Anesthesia Evaluation  Patient identified by MRN, date of birth, ID band Patient awake    Reviewed: Allergy & Precautions, NPO status , Patient's Chart, lab work & pertinent test results  History of Anesthesia Complications Negative for: history of anesthetic complications  Airway Mallampati: III  TM Distance: <3 FB Neck ROM: full    Dental  (+) Chipped, Poor Dentition, Loose   Pulmonary neg shortness of breath, sleep apnea    Pulmonary exam normal        Cardiovascular Exercise Tolerance: Good hypertension, (-) angina + CAD and + Cardiac Stents  Normal cardiovascular exam     Neuro/Psych  Neuromuscular disease    GI/Hepatic Neg liver ROS, PUD,GERD  Controlled,,  Endo/Other  negative endocrine ROS    Renal/GU negative Renal ROS  negative genitourinary   Musculoskeletal   Abdominal   Peds  Hematology negative hematology ROS (+)   Anesthesia Other Findings Past Medical History: No date: Anxiety No date: Arthritis No date: B12 deficiency No date: Basal cell carcinoma (BCC) of forearm No date: Coronary artery disease     Comment:  a.) LHC 01/21/2006: 100 % pRCA, 100% mRCA, 100% dRCA.               PCI peformed; 80% mRCA - PCI complicated by spiral               dissection --> 3.0x 23mm Cypher DES to dRCA, 3.0x47mm               Cypher DES to mRCA, and 3.5x23 mm Cypher DES pRCA. b.)               07/2020 MV: EF>65%, no ischemia/infarct. 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               Mildly dil LA. Triv MR. No date: Esophagitis, erosive No date: Fibromyalgia No date: Gastritis No date: GERD (gastroesophageal reflux disease) No date: Hemorrhoids No date: History of kidney stones No date: Hyperlipidemia LDL goal <70 No date: Hypertension No date: IBS (irritable bowel syndrome) No date: Lichen planus No date: Long  term (current) use of antithrombotics/antiplatelets     Comment:  a.) DAPT (ASA + clopidogrel ) No date: Low serum vitamin D No date: Morbid obesity (HCC) No date: OSA (obstructive sleep apnea)     Comment:  a.) waiting on CPAP machine as of 01/22/2021 No date: Osteoporosis No date: Panic disorder No date: Tubular adenoma  Past Surgical History: No date: ABDOMINAL HYSTERECTOMY 02/16/2023: BIOPSY     Comment:  Procedure: BIOPSY;  Surgeon: Aundria, Ladell POUR, MD;                Location: [facility] ENDOSCOPY;  Service: Gastroenterology;; 03/15/2017: BREAST BIOPSY; Right     Comment:  PSEUDO-ANGIOMATOUS STROMAL HYPERPLASIA.  No date: CHOLECYSTECTOMY No date: COLONOSCOPY 01/21/2006: CORONARY ANGIOPLASTY WITH STENT PLACEMENT; Left     Comment:  Procedure: CORONARY ANGIOPLASTY WITH STENT PLACEMENT               (Cypher DES x 3 placed; 3.0 x 23 dRCA, 3.0 x 28 mRCA, 3.5              x 23 mm pRCA); Location: [facility]; Surgeon: Marsa Dooms, MD No date: DILATION AND CURETTAGE OF UTERUS No date: ESOPHAGOGASTRODUODENOSCOPY  02/16/2023: ESOPHAGOGASTRODUODENOSCOPY (EGD) WITH PROPOFOL ; N/A     Comment:  Procedure: ESOPHAGOGASTRODUODENOSCOPY (EGD) WITH               PROPOFOL ;  Surgeon: Toledo, Ladell POUR, MD;  Location:               [facility] ENDOSCOPY;  Service: Gastroenterology;  Laterality:               N/A; 03/26/2016: FINGER ARTHRODESIS; Left     Comment:  Procedure: ARTHRODESIS FINGER;  Surgeon: Ozell Flake,               MD;  Location: [facility] ORS;  Service: Orthopedics;                Laterality: Left; No date: JOINT REPLACEMENT No date: SKIN CANCER EXCISION 02/04/2021: TOTAL KNEE ARTHROPLASTY; Left     Comment:  Procedure: TOTAL KNEE ARTHROPLASTY;  Surgeon: Flake Ozell, MD;  Location: [facility] ORS;  Service: Orthopedics;               Laterality: Left; No date: TUBAL LIGATION  BMI    Body Mass Index: 33.55 kg/m      Reproductive/Obstetrics negative OB ROS                               Anesthesia Physical Anesthesia Plan  ASA: 3  Anesthesia Plan: General   Post-op Pain Management:    Induction: Intravenous  PONV Risk Score and Plan: Propofol  infusion and TIVA  Airway Management Planned: Natural Airway and Nasal Cannula  Additional Equipment:   Intra-op Plan:   Post-operative Plan:   Informed Consent: I have reviewed the patients History and Physical, chart, labs and discussed the procedure including the risks, benefits and alternatives for the proposed anesthesia with the patient or authorized representative who has indicated his/her understanding and acceptance.     Dental Advisory Given  Plan Discussed with: Anesthesiologist, CRNA and Surgeon  Anesthesia Plan Comments: (Patient consented for risks of anesthesia including but not limited to:  - adverse reactions to medications - risk of airway placement if required - damage to eyes, teeth, lips or other oral mucosa - nerve damage due to positioning  - sore throat or hoarseness - Damage to heart, brain, nerves, lungs, other parts of body or loss of life  Patient voiced understanding and assent.)        Anesthesia Quick Evaluation

## 2024-03-15 NOTE — Progress Notes (Signed)
 Progress Note   Patient: Judy Prince FMW:[id number] DOB: Jun 10, 1955 DOA: 03/11/2024     3 DOS: the patient was seen and examined on 03/15/2024   Brief hospital course: 68 year old female with history of CAD, IBS, gastritis, fibromyalgia, history of stone came into ED complaining of abdominal pain and dysuria.  She went to primary care's office with some urinary frequency and thought to have a urinary tract infection, called PCP office and they prescribed her Macrobid for 2 days.  Patient stated that she was compliant with the medications.  Even after 36 hours of treatment, patient did not have improvement in her urinary symptoms and decided to come to the emergency room for evaluation. She was admitted from the emergency room with UTI and sepsis.  She had temperature of 100.2.  Lactic acid was 3.   12/16:  Noted that blood cultures had to be reincubated for better growth at approximately 1130 today.  Continue ceftriaxone  2 g IV, day 4 of antibiotics.  12/17 : Discussed with ID who recommends a TEE since 2 sets of blood cultures are positive for Streptococcus salivarius and Streptococcus mitis/oralis    Assessment and Plan:  Sepsis due to undetermined organism (HCC) Streptococcus bacteremia in a patient who had recent dental work done, about 2 weeks prior to this admission. Appreciate ID input. Patient  has a prosthetic left knee but does not have any intravascular devices like pacemakers or ports Due to having 2 sets of blood cultures positive and strep mitis being a common cause of subacute bacterial endocarditis, he recommends a TEE to rule out  vegetations. Continue IV Rocephin , duration of antibiotic therapy to be determined by infectious disease physician Repeat blood cultures show no growth after 3 days     Acute cystitis without hematuria Urine culture is sterile   Coronary artery disease Continue aspirin  81 mg, Plavix  75 mg, metoprolol  tartrate 50 mg twice daily,  rosuvastatin  20 mg every evening and losartan  25 mg daily     GERD without esophagitis Famotidine  40 mg nightly   Obesity Class I BMI 33 Complicates overall prognosis and care Lifestyle modification and exercise has been discussed with patient in detail       Subjective: No new complaints.  Scheduled for TEE today  Physical Exam: Vitals:   03/14/24 1932 03/14/24 2140 03/15/24 0437 03/15/24 0832  BP: (!) 113/94 (!) 144/69 (!) 152/74 (!) 156/69  Pulse: 75 70 67 64  Resp: 16  16 20   Temp: 98.5 F (36.9 C)  98.3 F (36.8 C) 97.9 F (36.6 C)  TempSrc:    Oral  SpO2: 94% 99% 98% 95%  Weight:      Height:        Data Reviewed: General exam: Appears calm and comfortable  Respiratory system: Clear to auscultation. Respiratory effort normal. Cardiovascular system: S1 & S2 heard, RRR. No JVD, murmurs, rubs, gallops or clicks. No pedal edema. Gastrointestinal system: Central adiposity, abdomen is nondistended, soft and nontender. No organomegaly or masses felt. Normal bowel sounds heard. Central nervous system: Alert and oriented. No focal neurological deficits. Extremities: Symmetric 5 x 5 power. Skin: No rashes, lesions or ulcers Psychiatry: Judgement and insight appear normal. Mood & affect appropriate.   There are no new results to review at this time.  Family Communication: Plan of care was discussed with patient in detail.  She verbalizes understanding and agrees with the plan  Disposition: Status is: Inpatient Remains inpatient appropriate because: Follow-up results of 2D echocardiogram to rule  out endocarditis  Planned Discharge Destination: Home    Time spent: 45 minutes  Author: Aimee Somerset, MD 03/15/2024 11:32 AM  For on call review [email or web address].

## 2024-03-15 NOTE — TOC Progression Note (Signed)
 Transition of Care [facility]) - Progression Note    Patient Details  Name: Judy Prince MRN: [id number] Date of Birth: 02/08/1956  Transition of [facility] CM/SW Contact  Dalia GORMAN Fuse, RN Phone Number: 03/15/2024, 9:25 AM  Clinical Narrative:    First set of cultures growing strep mitis, second set growing another source of strep. Will continue IV Ceftriaxone  until second set of cultures finalize. If Ceftriaxone  is needed, it can be given IM at [facility] once the patient is discharged from inpt setting. No TOC needs at this time.                      Expected Discharge Plan and Services                                               Social Drivers of Health (SDOH) Interventions SDOH Screenings   Food Insecurity: No Food Insecurity (03/13/2024)  Housing: Low Risk (03/13/2024)  Transportation Needs: No Transportation Needs (03/13/2024)  Utilities: Not At Risk (03/13/2024)  Financial Resource Strain: Low Risk  (02/15/2024)   Received from [facility]  Social Connections: Unknown (03/13/2024)  Tobacco Use: Low Risk (03/14/2024)    Readmission Risk Interventions     No data to display

## 2024-03-15 NOTE — Plan of Care (Signed)

## 2024-03-15 NOTE — Discharge Summary (Signed)
 Physician Discharge Summary   Patient: Judy Prince MRN: [id number] DOB: 04/30/1955  Admit date:     03/11/2024  Discharge date: 03/15/2024  Discharge Physician: Nasim Garofano   PCP: Cleotilde Oneil FALCON, MD   Recommendations at discharge:   Complete antibiotic therapy as recommended Follow-up with ID in 1 week  Discharge Diagnoses: Principal Problem:   Acute cystitis without hematuria Active Problems:   Bacteremia due to Streptococcus   Dyslipidemia   Coronary artery disease   GERD without esophagitis   Hyperlipidemia   Obesity (BMI 30-39.9)   Sepsis due to urinary tract infection (HCC)  Resolved Problems:   * No resolved hospital problems. [facility] Course: Judy Prince is a 68 y.o. Caucasian female with medical history significant for anxiety, osteoarthritis, coronary artery disease, status post PCI and 3 stents, diastolic dysfunction, fibromyalgia, GERD, hypertension, dyslipidemia, IBS and lichen planus as well as OSA, who presented to the ER with acute onset of persistent dysuria, urinary frequency with associated lower abdominal pain, chills, generalized weakness and nausea with vomiting twice.  She was prescribed Macrobid yesterday for suspected UTI.  She was concerned about being dehydrated from polyuria.  She denies any chest pain or palpitations.  No cough or wheezing or dyspnea.  No bleeding diathesis.   ED Course: When she came to the ER, BP was 169/65 with otherwise normal vital signs.  Labs revealed unremarkable CMP and CBC.  UA was negative.  Urine culture was sent as well as 2 blood cultures. EKG as reviewed by me : None Imaging: Abdominal and pelvic CT scan revealed the following: 1. Possible mild inflammatory changes along the anterior superior urinary bladder wall (series 2 image 82), consider UTI or Cystitis. 2. No other acute or inflammatory process identified in the abdomen or pelvis. 3. Calcified aortic, coronary artery atherosclerosis.   The patient  was given 10 mg of IV Reglan , 1 L bolus of IV lactated Ringer , 4 mg of IV morphine  sulfate and 4 mg of IV Zofran , 2 g of IV Rocephin  and 1 g of IV Tylenol .  She will be admitted to a telemetry observation bed for further evaluation and management.      Assessment and Plan:  Acute cystitis without hematuria Urine culture is sterile Completed a course of Rocephin    Streptococcus bacteremia Streptococcus bacteremia in a patient who had recent dental work done, about 2 weeks prior to this admission. Appreciate ID input. Patient  has a prosthetic left knee but does not have any intravascular devices like pacemakers or ports Due to having 2 sets of blood cultures positive and strep mitis being a common cause of subacute bacterial endocarditis, he recommended a TEE to rule out  vegetations. Patient was on IV Rocephin  during this hospitalization and will be discharged home on Levaquin  500 mg p.o. daily for 1 week and amoxicillin  1 g p.o. 3 times daily x 7 days per ID recommendation Repeat blood cultures show no growth after 3 days Patient to follow-up with ID as an outpatient        Coronary artery disease Continue aspirin  81 mg, Plavix  75 mg, metoprolol  tartrate 50 mg twice daily, rosuvastatin  20 mg  and losartan  25 mg daily     GERD without esophagitis Famotidine  40 mg nightly     Obesity Class I BMI 33 Complicates overall prognosis and care Lifestyle modification and exercise has been discussed with patient in detail            Consultants: Cardiology,  infectious disease Procedures performed: TEE Disposition: Home Diet recommendation:  Discharge Diet Orders (From admission, onward)     Start     Ordered   03/15/24 0000  Diet - low sodium heart healthy        03/15/24 1531           Cardiac diet DISCHARGE MEDICATION: Allergies as of 03/15/2024       Reactions   Molds & Smuts    Other Other (See Comments)   Trees  Cut grass causes eyes water/itch& throat  discomfort        Medication List     STOP taking these medications    ALLEGRA  ALLERGY PO   ciprofloxacin 250 MG tablet Commonly known as: CIPRO   CO Q-10 PO   nitrofurantoin (macrocrystal-monohydrate) 100 MG capsule Commonly known as: MACROBID   pantoprazole  40 MG tablet Commonly known as: PROTONIX        TAKE these medications    acetaminophen  500 MG tablet Commonly known as: TYLENOL  Take 500 mg by mouth every 6 (six) hours as needed for mild pain (pain score 1-3).   amoxicillin  500 MG tablet Commonly known as: AMOXIL  Take 2 tablets (1,000 mg total) by mouth in the morning, at noon, and at bedtime for 7 days.   aspirin  EC 81 MG tablet Take 81 mg by mouth daily. Swallow whole.   BIOFREEZE EX Apply 1 application topically daily as needed (joint pain).   clopidogrel  75 MG tablet Commonly known as: PLAVIX  Take 75 mg by mouth daily.   ezetimibe  10 MG tablet Commonly known as: ZETIA  Take 1 tablet (10 mg total) by mouth daily.   famotidine  20 MG tablet Commonly known as: PEPCID  Take 40 mg by mouth at bedtime.   FLONASE  ALLERGY RELIEF NA Place into the nose. 2 sprays each nostril daily   lactobacillus acidophilus & bulgar chewable tablet Chew 1 tablet by mouth 3 (three) times daily with meals for 10 days.   levofloxacin  250 MG tablet Commonly known as: Levaquin  Take 2 tablets (500 mg total) by mouth daily for 7 days.   losartan  25 MG tablet Commonly known as: COZAAR  TAKE 1 TABLET (25 MG TOTAL) BY MOUTH DAILY.   meloxicam 15 MG tablet Commonly known as: MOBIC Take 15 mg by mouth daily as needed for pain.   metoprolol  tartrate 50 MG tablet Commonly known as: LOPRESSOR  Take 50 mg by mouth 2 (two) times daily.   rosuvastatin  20 MG tablet Commonly known as: CRESTOR  Take 20 mg by mouth daily. What changed: Another medication with the same name was removed. Continue taking this medication, and follow the directions you see here.        Follow-up  Information     Cleotilde Oneil FALCON, MD Follow up.   Specialty: Internal Medicine Why: hospital follow up Contact information: [address] [place] [address] [phone]         Epifanio Alm SQUIBB, MD Follow up in 1 week(s).   Specialty: Infectious Diseases Contact information: [address] [place] [address] [phone]                Discharge Exam: Fredricka Weights   03/11/24 0556  Weight: 83.2 kg   General exam: Appears calm and comfortable, obese Respiratory system: Clear to auscultation. Respiratory effort normal. Cardiovascular system: S1 & S2 heard, RRR. No JVD, murmurs, rubs, gallops or clicks. No pedal edema. Gastrointestinal system: Central adiposity, abdomen is nondistended, soft and nontender. No organomegaly or  masses felt. Normal bowel sounds heard. Central nervous system: Alert and oriented. No focal neurological deficits. Extremities: Symmetric 5 x 5 power. Skin: No rashes, lesions or ulcers Psychiatry: Judgement and insight appear normal. Mood & affect appropriate.     Condition at discharge: stable  The results of significant diagnostics from this hospitalization (including imaging, microbiology, ancillary and laboratory) are listed below for reference.   Imaging Studies: ECHO TEE Result Date: 03/15/2024    TRANSESOPHOGEAL ECHO REPORT   Patient Name:   Judy Prince Date of Exam: 03/15/2024 Medical Rec #:  [id number]      Height:       62.0 in Accession #:    [id number]     Weight:       183.4 lb Date of Birth:  1956-01-04       BSA:          1.843 m Patient Age:    68 years       BP:           145/78 mmHg Patient Gender: F              HR:           72 bpm. Exam Location:  [facility] Procedure: Transesophageal Echo (Both Spectral and Color Flow Doppler were            utilized during procedure). Indications:     Bacteremia R78.81  History:         Patient has prior history of Echocardiogram examinations.                   Signs/Symptoms:Bacteremia.  Sonographer:     Ashley McNeely-Sloane Referring Phys:  [id number] EVALENE JINNY LUNGER Diagnosing Phys: Evalene Lunger MD PROCEDURE: After discussion of the risks and benefits of a TEE, an informed consent was obtained from the patient. TEE procedure time was 30 minutes. The transesophogeal probe was passed without difficulty through the esophogus of the patient. Imaged were obtained with the patient in a left lateral decubitus position. Local oropharyngeal anesthetic was provided with viscous lidocaine  and Cetacaine . Sedation performed by different physician. The patient was monitored while under deep sedation. The patient's vital signs; including heart rate, blood pressure, and oxygen saturation; remained stable throughout the procedure. The patient developed no complications during the procedure.  IMPRESSIONS  1. Left ventricular ejection fraction, by estimation, is 60 to 65%. The left ventricle has normal function. The left ventricle has no regional wall motion abnormalities.  2. Right ventricular systolic function is normal. The right ventricular size is normal.  3. No left atrial/left atrial appendage thrombus was detected.  4. The mitral valve is normal in structure. No evidence of mitral valve regurgitation. No evidence of mitral stenosis.  5. The aortic valve is tricuspid. Aortic valve regurgitation is not visualized. No aortic stenosis is present.  6. The inferior vena cava is normal in size with greater than 50% respiratory variability, suggesting right atrial pressure of 3 mmHg.  7. Agitated saline contrast bubble study was negative, with no evidence of any interatrial shunt.  8. No valve vegetation noted. Conclusion(s)/Recommendation(s): Normal biventricular function without evidence of hemodynamically significant valvular heart disease. FINDINGS  Left Ventricle: Left ventricular ejection fraction, by estimation, is 60 to 65%. The left ventricle has normal function. The left ventricle  has no regional wall motion abnormalities. The left ventricular internal cavity size was normal in size. There is  no left ventricular hypertrophy. Right Ventricle: The right ventricular size  is normal. No increase in right ventricular wall thickness. Right ventricular systolic function is normal. Left Atrium: Left atrial size was normal in size. No left atrial/left atrial appendage thrombus was detected. Right Atrium: Right atrial size was normal in size. Pericardium: There is no evidence of pericardial effusion. Mitral Valve: The mitral valve is normal in structure. No evidence of mitral valve regurgitation. No evidence of mitral valve stenosis. Tricuspid Valve: The tricuspid valve is normal in structure. Tricuspid valve regurgitation is not demonstrated. No evidence of tricuspid stenosis. Aortic Valve: The aortic valve is tricuspid. Aortic valve regurgitation is not visualized. No aortic stenosis is present. Pulmonic Valve: The pulmonic valve was normal in structure. Pulmonic valve regurgitation is not visualized. No evidence of pulmonic stenosis. Aorta: The aortic root is normal in size and structure. Venous: The inferior vena cava is normal in size with greater than 50% respiratory variability, suggesting right atrial pressure of 3 mmHg. IAS/Shunts: No atrial level shunt detected by color flow Doppler. Agitated saline contrast was given intravenously to evaluate for intracardiac shunting. Agitated saline contrast bubble study was negative, with no evidence of any interatrial shunt. There  is no evidence of a patent foramen ovale. There is no evidence of an atrial septal defect. Additional Comments: 3D was performed not requiring image post processing on an independent workstation and was indeterminate. Evalene Lunger MD Electronically signed by Evalene Lunger MD Signature Date/Time: 03/15/2024/3:20:13 PM    Final    ECHOCARDIOGRAM COMPLETE Result Date: 03/12/2024    ECHOCARDIOGRAM REPORT   Patient Name:    Judy Prince Date of Exam: 03/12/2024 Medical Rec #:  [id number]      Height:       62.0 in Accession #:    [id number]     Weight:       183.4 lb Date of Birth:  [birth year]       BSA:          1.843 m Patient Age:    68 years       BP:           147/80 mmHg Patient Gender: F              HR:           60 bpm. Exam Location:  [facility] Procedure: 2D Echo, 3D Echo, Color Doppler, Cardiac Doppler and Strain Analysis            (Both Spectral and Color Flow Doppler were utilized during            procedure). Indications:     Bacteremia  History:         Patient has prior history of Echocardiogram examinations, most                  recent 08/08/2020. CAD, Signs/Symptoms:Chest Pain and Murmur;                  Risk Factors:Hypertension, Dyslipidemia and Sleep Apnea.  Sonographer:     Logan Shove RDCS Referring Phys:  [id number] [facility] PAUDEL Diagnosing Phys: Shelda Bruckner MD IMPRESSIONS  1. Left ventricular ejection fraction, by estimation, is 65 to 70%. The left ventricle has normal function. The left ventricle has no regional wall motion abnormalities. Left ventricular diastolic parameters are consistent with Grade I diastolic dysfunction (impaired relaxation). The average left ventricular global longitudinal strain is -20.4 %. The global longitudinal strain is normal.  2. Right ventricular systolic function is normal. The right ventricular size is  normal.  3. The mitral valve is grossly normal. No evidence of mitral valve regurgitation. No evidence of mitral stenosis.  4. The aortic valve is tricuspid. There is mild calcification of the aortic valve. Aortic valve regurgitation is not visualized. No aortic stenosis is present.  5. The inferior vena cava is normal in size with greater than 50% respiratory variability, suggesting right atrial pressure of 3 mmHg. Comparison(s): No significant change from prior study. Conclusion(s)/Recommendation(s): Normal biventricular function without evidence of hemodynamically  significant valvular heart disease. No evidence of valvular vegetations on this transthoracic echocardiogram. Consider a transesophageal echocardiogram to exclude infective endocarditis if clinically indicated. FINDINGS  Left Ventricle: Left ventricular ejection fraction, by estimation, is 65 to 70%. The left ventricle has normal function. The left ventricle has no regional wall motion abnormalities. The average left ventricular global longitudinal strain is -20.4 %. Strain was performed and the global longitudinal strain is normal. The left ventricular internal cavity size was normal in size. There is no left ventricular hypertrophy. Left ventricular diastolic parameters are consistent with Grade I diastolic dysfunction (impaired relaxation). Right Ventricle: The right ventricular size is normal. Right vetricular wall thickness was not well visualized. Right ventricular systolic function is normal. Left Atrium: Left atrial size was normal in size. Right Atrium: Right atrial size was normal in size. Pericardium: There is no evidence of pericardial effusion. Presence of epicardial fat layer. Mitral Valve: The mitral valve is grossly normal. Mild mitral annular calcification. No evidence of mitral valve regurgitation. No evidence of mitral valve stenosis. Tricuspid Valve: The tricuspid valve is grossly normal. Tricuspid valve regurgitation is trivial. No evidence of tricuspid stenosis. Aortic Valve: The aortic valve is tricuspid. There is mild calcification of the aortic valve. Aortic valve regurgitation is not visualized. No aortic stenosis is present. Aortic valve peak gradient measures 8.0 mmHg. Pulmonic Valve: The pulmonic valve was not well visualized. Pulmonic valve regurgitation is trivial. No evidence of pulmonic stenosis. Aorta: The aortic root, ascending aorta and aortic arch are all structurally normal, with no evidence of dilitation or obstruction. Venous: The inferior vena cava is normal in size with  greater than 50% respiratory variability, suggesting right atrial pressure of 3 mmHg. IAS/Shunts: The atrial septum is grossly normal. Additional Comments: 3D was performed not requiring image post processing on an independent workstation and was normal.  LEFT VENTRICLE PLAX 2D LVIDd:         4.10 cm   Diastology LVIDs:         2.60 cm   LV e' medial:    6.31 cm/s LV PW:         0.70 cm   LV E/e' medial:  10.5 LV IVS:        0.90 cm   LV e' lateral:   7.62 cm/s LVOT diam:     1.90 cm   LV E/e' lateral: 8.7 LVOT Area:     2.84 cm                          2D Longitudinal Strain                          2D Strain GLS (A4C):   -20.9 %                          2D Strain GLS (A3C):   -20.4 %  2D Strain GLS (A2C):   -20.0 %                          2D Strain GLS Avg:     -20.4 %                           3D Volume EF:                          3D EF:        61 %                          LV EDV:       95 ml                          LV ESV:       37 ml                          LV SV:        57 ml RIGHT VENTRICLE            IVC RV Basal diam:  2.70 cm    IVC diam: 1.50 cm RV S prime:     8.27 cm/s TAPSE (M-mode): 2.1 cm LEFT ATRIUM             Index        RIGHT ATRIUM           Index LA diam:        3.90 cm 2.12 cm/m   RA Area:     13.80 cm LA Vol (A2C):   46.3 ml 25.13 ml/m  RA Volume:   28.10 ml  15.25 ml/m LA Vol (A4C):   36.5 ml 19.81 ml/m LA Biplane Vol: 42.7 ml 23.17 ml/m  AORTIC VALVE AV Area (Vmax): 2.11 cm AV Vmax:        141.00 cm/s AV Peak Grad:   8.0 mmHg LVOT Vmax:      105.00 cm/s  AORTA Ao Root diam: 2.80 cm Ao Asc diam:  2.80 cm MITRAL VALVE MV Area (PHT): 2.24 cm     SHUNTS MV Decel Time: 338 msec     Systemic Diam: 1.90 cm MV E velocity: 66.00 cm/s MV A velocity: 101.00 cm/s MV E/A ratio:  0.65 Shelda Bruckner MD Electronically signed by Shelda Bruckner MD Signature Date/Time: 03/12/2024/2:59:01 PM    Final    CT ABDOMEN PELVIS W CONTRAST Result Date:  03/11/2024 EXAM: CT ABDOMEN AND PELVIS WITH CONTRAST 03/11/2024 03:57:04 AM TECHNIQUE: CT of the abdomen and pelvis was performed with the administration of 100 mL of iohexol  (OMNIPAQUE ) 300 MG/ML solution. Multiplanar reformatted images are provided for review. Automated exposure control, iterative reconstruction, and/or weight-based adjustment of the mA/kV was utilized to reduce the radiation dose to as low as reasonably achievable. COMPARISON: CT abdomen and pelvis 06/17/2006. CLINICAL HISTORY: 68 year old female with weakness, nausea, lower abdominal pain, frequent urination, started on treatment for urinary tract infection yesterday. FINDINGS: LOWER CHEST: Calcified coronary artery atherosclerosis and/or stents visible (series 2 image 3). Normal heart size. Negative lung bases; mild dependent atelectasis. LIVER: The liver is unremarkable. GALLBLADDER AND BILE DUCTS: Chronic cholecystectomy. No biliary ductal dilatation. SPLEEN: No acute abnormality. PANCREAS: No acute abnormality. ADRENAL GLANDS:  No acute abnormality. KIDNEYS, URETERS AND BLADDER: Bilateral renal enhancement and contrast excretion appears symmetric and normal. There is mild possible inflammatory stranding along the anterior superior urinary bladder wall on series 2 image 82. Otherwise unremarkable bladder, renal collecting systems, and ureters. Incidental pelvic phleboliths. No stones in the kidneys or ureters. No hydronephrosis. No perinephric or periureteral stranding. GI AND BOWEL: Stomach demonstrates no acute abnormality. Normal appendix in the right lower quadrant on series 2 image 65. There is no bowel obstruction. PERITONEUM AND RETROPERITONEUM: No ascites. No free air. VASCULATURE: Calcified aortoiliac atherosclerosis. Major arterial and portal venous structures are patent. Aorta is normal in caliber. LYMPH NODES: No lymphadenopathy. REPRODUCTIVE ORGANS: Surgically absent uterus and diminutive or absent ovaries as in 2008. BONES AND  SOFT TISSUES: Osteopenia. Some hyperostosis in the visible lower thoracic spine and occasional interbody ankylosis. Advanced lower lumbar facet arthropathy including some vacuum facet disease. No acute osseous abnormality. No focal soft tissue abnormality. IMPRESSION: 1. Possible mild inflammatory changes along the anterior superior urinary bladder wall (series 2 image 82), consider UTI or Cystitis. 2. No other acute or inflammatory process identified in the abdomen or pelvis. 3. Calcified aortic, coronary artery atherosclerosis. Electronically signed by: Helayne Hurst MD 03/11/2024 04:07 AM EST RP Workstation: [id number]    Microbiology: Results for orders placed or performed during the hospital encounter of 03/11/24  Urine Culture     Status: None   Collection Time: 03/11/24  1:06 AM   Specimen: Urine, Clean Catch  Result Value Ref Range Status   Specimen Description   Final    URINE, CLEAN CATCH Performed at [facility], [address] [place], [place] [address]    Special Requests   Final    NONE Performed at [facility], [address] [place], [place] [address]    Culture   Final    NO GROWTH Performed at [facility], [address], [place], [place] [address]    Report Status 03/12/2024 FINAL  Final  Blood culture (routine x 2)     Status: Abnormal   Collection Time: 03/11/24  4:25 AM   Specimen: BLOOD  Result Value Ref Range Status   Specimen Description   Final    BLOOD BLOOD LEFT ARM Performed at [facility], [address] [place], [place] [address]    Special Requests   Final    BOTTLES DRAWN AEROBIC AND ANAEROBIC Blood Culture results may not be optimal due to an inadequate volume of blood received in culture bottles Performed at [facility], [address] [place], [place] [address]    Culture  Setup Time   Final    Organism ID to follow GRAM POSITIVE COCCI ANAEROBIC BOTTLE ONLY CRITICAL RESULT CALLED TO, READ  BACK BY AND VERIFIED WITH: TIFFANY GILCHRIST ON 03/11/24 AT 1704 QSD Performed at [facility], [address] [place], [place] [address]    Culture (A)  Final    STREPTOCOCCUS SALIVARIUS STREPTOCOCCUS MITIS/ORALIS SUSCEPTIBILITIES PERFORMED ON PREVIOUS CULTURE WITHIN THE LAST 5 DAYS. Performed at [facility], [address], [place], [place] [address]    Report Status 03/15/2024 FINAL  Final   Organism ID, Bacteria STREPTOCOCCUS SALIVARIUS  Final      Susceptibility   Streptococcus salivarius - MIC*    PENICILLIN 0.25 INTERMEDIATE Intermediate     CEFTRIAXONE  0.25 SENSITIVE Sensitive     ERYTHROMYCIN >=8 RESISTANT Resistant     LEVOFLOXACIN  2 SENSITIVE Sensitive  VANCOMYCIN 0.5 SENSITIVE Sensitive     * STREPTOCOCCUS SALIVARIUS  Blood Culture ID Panel (Reflexed)     Status: Abnormal   Collection Time: 03/11/24  4:25 AM  Result Value Ref Range Status   Enterococcus faecalis NOT DETECTED NOT DETECTED Final   Enterococcus Faecium NOT DETECTED NOT DETECTED Final   Listeria monocytogenes NOT DETECTED NOT DETECTED Final   Staphylococcus species NOT DETECTED NOT DETECTED Final   Staphylococcus aureus (BCID) NOT DETECTED NOT DETECTED Final   Staphylococcus epidermidis NOT DETECTED NOT DETECTED Final   Staphylococcus lugdunensis NOT DETECTED NOT DETECTED Final   Streptococcus species DETECTED (A) NOT DETECTED Final    Comment: Not Enterococcus species, Streptococcus agalactiae, Streptococcus pyogenes, or Streptococcus pneumoniae. CRITICAL RESULT CALLED TO, READ BACK BY AND VERIFIED WITH: TIFFANY GILCHRIST ON 03/11/24 AT 1704 QSD    Streptococcus agalactiae NOT DETECTED NOT DETECTED Final   Streptococcus pneumoniae NOT DETECTED NOT DETECTED Final   Streptococcus pyogenes NOT DETECTED NOT DETECTED Final   A.calcoaceticus-baumannii NOT DETECTED NOT DETECTED Final   Bacteroides fragilis NOT DETECTED NOT DETECTED Final   Enterobacterales NOT DETECTED NOT DETECTED Final    Enterobacter cloacae complex NOT DETECTED NOT DETECTED Final   Escherichia coli NOT DETECTED NOT DETECTED Final   Klebsiella aerogenes NOT DETECTED NOT DETECTED Final   Klebsiella oxytoca NOT DETECTED NOT DETECTED Final   Klebsiella pneumoniae NOT DETECTED NOT DETECTED Final   Proteus species NOT DETECTED NOT DETECTED Final   Salmonella species NOT DETECTED NOT DETECTED Final   Serratia marcescens NOT DETECTED NOT DETECTED Final   Haemophilus influenzae NOT DETECTED NOT DETECTED Final   Neisseria meningitidis NOT DETECTED NOT DETECTED Final   Pseudomonas aeruginosa NOT DETECTED NOT DETECTED Final   Stenotrophomonas maltophilia NOT DETECTED NOT DETECTED Final   Candida albicans NOT DETECTED NOT DETECTED Final   Candida auris NOT DETECTED NOT DETECTED Final   Candida glabrata NOT DETECTED NOT DETECTED Final   Candida krusei NOT DETECTED NOT DETECTED Final   Candida parapsilosis NOT DETECTED NOT DETECTED Final   Candida tropicalis NOT DETECTED NOT DETECTED Final   Cryptococcus neoformans/gattii NOT DETECTED NOT DETECTED Final    Comment: Performed at [facility], [address] [place], [place] [address]  Blood culture (routine x 2)     Status: Abnormal   Collection Time: 03/11/24  4:30 AM   Specimen: BLOOD  Result Value Ref Range Status   Specimen Description   Final    BLOOD BLOOD RIGHT ARM Performed at [facility], [address] [place], [place] [address]    Special Requests   Final    BOTTLES DRAWN AEROBIC AND ANAEROBIC Blood Culture results may not be optimal due to an inadequate volume of blood received in culture bottles Performed at [facility], [address] [place], [place] [address]    Culture  Setup Time   Final    GRAM POSITIVE COCCI IN BOTH AEROBIC AND ANAEROBIC BOTTLES CRITICAL VALUE NOTED.  VALUE IS CONSISTENT WITH PREVIOUSLY REPORTED AND CALLED VALUE. Performed at [facility], [address] [place], [place]  [address]    Culture (A)  Final    STREPTOCOCCUS MITIS/ORALIS GEMELLA HAEMOLYSANS Standardized susceptibility testing for this organism is not available. Performed at [facility], [address] [place], [place] [address]    Report Status 03/15/2024 FINAL  Final   Organism ID, Bacteria STREPTOCOCCUS MITIS/ORALIS  Final      Susceptibility   Streptococcus mitis/oralis - MIC*  PENICILLIN <=0.06 SENSITIVE Sensitive     CEFTRIAXONE  <=0.12 SENSITIVE Sensitive     LEVOFLOXACIN  1 SENSITIVE Sensitive     VANCOMYCIN 0.5 SENSITIVE Sensitive     * STREPTOCOCCUS MITIS/ORALIS  Culture, blood (Routine X 2) w Reflex to ID Panel     Status: None (Preliminary result)   Collection Time: 03/12/24 11:21 AM   Specimen: BLOOD LEFT HAND  Result Value Ref Range Status   Specimen Description BLOOD LEFT HAND  Final   Special Requests   Final    BOTTLES DRAWN AEROBIC AND ANAEROBIC Blood Culture adequate volume   Culture   Final    NO GROWTH 3 DAYS Performed at [facility], [address] [place], [place] [address]    Report Status PENDING  Incomplete  Culture, blood (Routine X 2) w Reflex to ID Panel     Status: None (Preliminary result)   Collection Time: 03/12/24 11:21 AM   Specimen: BLOOD RIGHT HAND  Result Value Ref Range Status   Specimen Description BLOOD RIGHT HAND  Final   Special Requests   Final    BOTTLES DRAWN AEROBIC AND ANAEROBIC Blood Culture results may not be optimal due to an inadequate volume of blood received in culture bottles   Culture   Final    NO GROWTH 3 DAYS Performed at [facility], [address] [place], [place] [address]    Report Status PENDING  Incomplete    Labs: CBC: Recent Labs  Lab 03/11/24 0106 03/11/24 0632  WBC 5.8 5.7  HGB 14.9 13.9  HCT 45.2 41.1  MCV 90.8 90.1  PLT 188 160   Basic Metabolic Panel: Recent Labs  Lab 03/11/24 0106 03/11/24 0632  NA 136 133*  K 4.6 3.9  CL 101 99  CO2 25 24  GLUCOSE 96 110*   BUN 17 13  CREATININE 0.80 0.67  CALCIUM  9.7 8.9   Liver Function Tests: Recent Labs  Lab 03/11/24 0106  AST 21  ALT 23  ALKPHOS 77  BILITOT 0.4  PROT 7.6  ALBUMIN 4.2   CBG: Recent Labs  Lab 03/12/24 1608 03/12/24 1953 03/13/24 0732 03/13/24 2000 03/14/24 0838  GLUCAP 133* 118* 96 161* 113*    Discharge time spent: greater than 30 minutes.  Signed: Aimee Somerset, MD [facility] 03/15/2024

## 2024-03-16 ENCOUNTER — Encounter: Payer: Self-pay | Admitting: Cardiovascular Disease

## 2024-03-17 LAB — CULTURE, BLOOD (ROUTINE X 2)
Culture: NO GROWTH
Culture: NO GROWTH
Special Requests: ADEQUATE

## 2024-03-20 NOTE — Progress Notes (Signed)
 Chief Complaint  Patient presents with   Hospital Follow Up     Diagnoses  Sepsis due to undetermined organism (HCC)  Hyperlipidemia, unspecified hyperlipidemia type      abd pain urinating alot nausea          Patient is agreeable to [employer] scribe.   History of Present Illness Judy Prince is a 68 year old female who presents for follow-up after hospitalization for a urinary tract infection with sepsis.  She was admitted to the hospital on December 13 and discharged on December 17 for a urinary tract infection with sepsis. During her hospital stay, two blood cultures were positive for Streptococcus mitis. She was treated with IV antibiotics, and repeat cultures showed no further bacterial growth. She was discharged with prescriptions for Levaquin  500 mg once daily and amoxicillin  1 gram three times a day, with two more days of antibiotics remaining. She is experiencing heartburn and some stomach discomfort and is taking probiotics three times a day to help with gastrointestinal symptoms.  Prior to hospitalization, she experienced severe weakness and difficulty walking, which she initially attributed to dehydration. She had taken one dose of Cipro, which she felt might have contributed to her symptoms due to its side effects, including muscle pain and arthritis exacerbation. She underwent a dental procedure approximately two weeks before her hospital admission, which involved bleeding near the gums. There is a possibility that the Streptococcus mitis infection originated from this dental work.  She has a history of arthritis, particularly in her spine, causing chronic back pain. She cannot take ibuprofen due to its side effects and manages her pain with Tylenol . She also has osteopenia and some hypersensitivity issues. She reports a history of arteriosclerosis and takes heart medications regularly. She does not consume vegetables, citing a dislike for them, and tries to limit her intake  of sweets.  She is concerned about a potential yeast infection due to the antibiotics, experiencing mild itching but no significant symptoms yet. She takes care of her husband, who has COPD, and is actively involved in family activities, including preparing for Christmas with her family.    ROS  Review of systems is unremarkable for any active cardiac, respiratory, GI, GU, hematologic, neurologic, dermatologic, HEENT, or psychiatric symptoms except as noted above.  No fevers, chills, or constitutional symptoms.   Current Outpatient Medications  Medication Sig Dispense Refill   acetaminophen  (TYLENOL ) 500 MG tablet Take 500 mg by mouth every 6 (six) hours as needed for Pain daily     amoxicillin  (AMOXIL ) 500 MG tablet Take 1,000 mg by mouth     aspirin  81 MG EC tablet Take 1 tablet (81 mg total) by mouth once daily     clopidogreL  (PLAVIX ) 75 mg tablet Take 1 tablet (75 mg total) by mouth once daily 90 tablet 3   ezetimibe  (ZETIA ) 10 mg tablet Take 1 tablet (10 mg total) by mouth once daily 90 tablet 3   famotidine  (PEPCID ) 20 MG tablet Take 2 tablets (40 mg total) by mouth at bedtime 180 tablet 3   levoFLOXacin  (LEVAQUIN ) 250 MG tablet Take 500 mg by mouth     losartan  (COZAAR ) 25 MG tablet Take 1 tablet (25 mg total) by mouth once daily for 356 days 90 tablet 3   meloxicam (MOBIC) 15 MG tablet Take 1 tablet (15 mg total) by mouth once daily as needed for Pain 30 tablet 11   metoprolol  TARTrate (LOPRESSOR ) 50 MG tablet Take 1 tablet (50 mg total)  by mouth 2 (two) times daily 180 tablet 3   rosuvastatin  (CRESTOR ) 10 MG tablet Take 1 tablet (10 mg total) by mouth once daily 90 tablet 3   cetirizine (ZYRTEC) 10 MG tablet Take 10 mg by mouth once daily (Patient not taking: Reported on 03/20/2024)     fluconazole (DIFLUCAN) 150 MG tablet Take 1 tablet (150 mg total) by mouth once for 1 dose 1 tablet 0   pantoprazole  (PROTONIX ) 40 MG DR tablet TAKE 1 TABLET BY MOUTH 2 TIMES DAILY  BEFORE MEALS. (Patient not taking: Reported on 03/20/2024) 180 tablet 3   No current facility-administered medications for this visit.    Allergies as of 03/20/2024 - Reviewed 03/20/2024  Allergen Reaction Noted   House dust Other (See Comments) 07/05/2017   Mold Other (See Comments) 07/05/2017   Other Unknown 08/02/2019    Patient Active Problem List  Diagnosis   Osteoporosis   CAD (coronary artery disease)   Hyperlipidemia, mixed   Low serum vitamin D   Benign essential hypertension   B12 deficiency   Medicare annual wellness visit, initial   Tubular adenoma   Esophagitis, erosive   S/P TKR (total knee replacement) using cement, left   Drug-induced constipation    Past Medical History:  Diagnosis Date   CAD (coronary artery disease)    Fibromyalgia    IBS   Gastritis    Hyperlipidemia    Hypertension    Kidney stones    Osteoporosis 12/20/2013   2/15/ fosamax    Past Surgical History:  Procedure Laterality Date   COLONOSCOPY  10/07/2011   FH Colon Polyps (Mother): [facility]BF 09/2016; Recall Ltr mailed 09/04/2016 (dw)   COLONOSCOPY  06/13/2019   Tubular adenoma of the colon/Repeat 46yrs/TKT   EGD  06/13/2019   Gastritis/Fundic gland polyp/No Repeat/TKT   ARTHROPLASTY HIP TOTAL Left 02/04/2021   Menz   ARTHROPLASTY TOTAL KNEE Left 02/04/2021   Dr. Kathlynn   EGD @ [facility]  02/16/2023   Gastric polyps/No repeat/TKT   CHOLECYSTECTOMY     DILATION AND CURETTAGE, DIAGNOSTIC / THERAPEUTIC     EGD  10/07/2011, 09/22/1999, 02/26/1992, 05/28/1987   Heart stents x 3     HYSTERECTOMY     partial   TUBAL LIGATION      Vitals:   03/20/24 1455  BP: (!) 144/80  Pulse: 71  SpO2: 97%  Weight: 80.5 kg (177 lb 6.4 oz)  Height: 157.5 cm (5' 2)  PainSc: 0-No pain   Body mass index is 32.45 kg/m.  Exam BP (!) 144/80 (BP Location: Left upper arm, Patient Position: Sitting, BP Cuff Size: Adult)   Pulse 71   Ht 157.5 cm (5' 2)   Wt 80.5 kg (177  lb 6.4 oz)   LMP  (LMP Unknown)   SpO2 97%   BMI 32.45 kg/m   General. Well appearing; NAD; VS reviewed     Eyes. Sclera and conjunctiva clear; Vision grossly intact; extraocular movements intact Oropharynx. No suspicious lesions Neck. Supple. No swelling, masses, thyroid  normal size, no masses palpated.   Lungs. Respirations unlabored; clear to auscultation bilaterally Cardiovascular. Heart regular rate and rhythm without murmurs, gallops, or rubs Extremities: without edema and with 2+ pulses bilaterally Skin. Normal color and turgor Neurologic. Alert and oriented x3; CN 2-12 grossly intact; no focal deficits  Assessment & Plan  Hospital discharge follow-up for sepsis due to Streptococcus species with urinary tract infection Recent sepsis due to Streptococcus mitis with UTI, treated with IV and oral antibiotics.  Cultures now negative. Heart function normal. Mild heartburn likely from antibiotics. - Continue Levaquin  and amoxicillin  as prescribed. - Follow up with infectious disease specialist Dr. Epifanio tomorrow. - Monitor for signs of diarrhea or gastrointestinal distress; consider stool studies if severe diarrhea occurs. - Continue probiotics with meals for a few weeks post-antibiotics.  Acute vaginitis Mild itching likely from antibiotics. No significant discharge or odor. Prefers to avoid medication unless symptoms worsen. - Prescribed Diflucan (fluconazole) as a one-time dose if symptoms worsen. - Advised to monitor for increased itching, discharge, or odor and take Diflucan if symptoms worsen.  General health maintenance Routine health maintenance discussed. Negative COVID test. Colon screening due in three months. - Will repeat colon screening in three months.    F/U: Patient to follow-up as needed.  JASON HESTLE WHITAKER, PA  This note has been created using automated tools and reviewed for accuracy by JASON HESTLE WHITAKER.   Note: This dictation was prepared  with Dragon dictation along with smaller phrase technology. Any transcriptional errors that result from this process are unintentional.

## 2024-04-10 ENCOUNTER — Telehealth: Payer: Self-pay | Admitting: Cardiovascular Disease

## 2024-04-10 NOTE — Telephone Encounter (Signed)
"  ° °  Pre-operative Risk Assessment    Patient Name: Judy Prince  DOB: [birth year] MRN: [id number]   Date of last office visit: 07/07/23 Date of next office visit: n/a  Request for Surgical Clearance    Procedure:  Dental Extraction - Amount of Teeth to be Pulled:  1 extraction on molar tooth  Date of Surgery:  Clearance 04/13/24                                Surgeon:  Juliene Shelter, DDS Surgeon's Group or Practice Name:  [facility] Phone number:  [phone] Fax number:  [phone]   Type of Clearance Requested:   - Pharmacy:  Hold Aspirin  and Clopidogrel  (Plavix ) 2 days prior and 1 day after   Type of Anesthesia:  Not Indicated   Additional requests/questions:    [name]   04/10/2024, 2:25 PM    "

## 2024-04-10 NOTE — Telephone Encounter (Signed)
"  ° °  Patient Name: Judy Prince  DOB: 03-24-1956 MRN: [id number]  Primary Cardiologist: Deatrice Cage, MD  Chart reviewed as part of pre-operative protocol coverage.  Simple dental extractions (i.e. 1-2 teeth) are considered low risk procedures per guidelines and generally do not require any specific cardiac clearance. It is also generally accepted that for simple extractions and dental cleanings, there is no need to interrupt blood thinner therapy.   SBE prophylaxis is not required for the patient from a cardiac standpoint.  I will route this recommendation to the requesting party via Epic fax function and remove from pre-op pool.  Please call with questions.  Lamarr Satterfield, NP 04/10/2024, 2:32 PM  "

## 2024-07-18 ENCOUNTER — Ambulatory Visit: Admitting: Dermatology
